# Patient Record
Sex: Female | Born: 1970
Health system: Southern US, Community
[De-identification: ages and names within clinical notes are randomized; demographics above are authoritative.]

## PROBLEM LIST (undated history)

## (undated) DIAGNOSIS — Z87891 Personal history of nicotine dependence: Secondary | ICD-10-CM

## (undated) DIAGNOSIS — T7840XA Allergy, unspecified, initial encounter: Secondary | ICD-10-CM

## (undated) DIAGNOSIS — F988 Other specified behavioral and emotional disorders with onset usually occurring in childhood and adolescence: Secondary | ICD-10-CM

## (undated) DIAGNOSIS — F4320 Adjustment disorder, unspecified: Secondary | ICD-10-CM

## (undated) DIAGNOSIS — E079 Disorder of thyroid, unspecified: Secondary | ICD-10-CM

## (undated) DIAGNOSIS — F32A Depression, unspecified: Secondary | ICD-10-CM

## (undated) DIAGNOSIS — N809 Endometriosis, unspecified: Secondary | ICD-10-CM

## (undated) HISTORY — DX: Disorder of thyroid, unspecified: E07.9

## (undated) HISTORY — PX: AUGMENTATION MAMMAPLASTY: SUR837

## (undated) HISTORY — DX: Other specified behavioral and emotional disorders with onset usually occurring in childhood and adolescence: F98.8

## (undated) HISTORY — DX: Endometriosis, unspecified: N80.9

## (undated) HISTORY — DX: Adjustment disorder, unspecified: F43.20

## (undated) HISTORY — DX: Allergy, unspecified, initial encounter: T78.40XA

## (undated) HISTORY — PX: WISDOM TOOTH EXTRACTION: SHX21

## (undated) HISTORY — PX: BREAST BIOPSY: SHX20

---

## 1999-04-11 ENCOUNTER — Other Ambulatory Visit: Admission: RE | Admit: 1999-04-11 | Discharge: 1999-04-11 | Payer: Self-pay | Admitting: Obstetrics and Gynecology

## 1999-05-08 ENCOUNTER — Emergency Department (HOSPITAL_COMMUNITY): Admission: EM | Admit: 1999-05-08 | Discharge: 1999-05-08 | Payer: Self-pay | Admitting: Emergency Medicine

## 2000-06-09 ENCOUNTER — Other Ambulatory Visit: Admission: RE | Admit: 2000-06-09 | Discharge: 2000-06-09 | Payer: Self-pay | Admitting: Obstetrics and Gynecology

## 2000-08-18 HISTORY — PX: OTHER SURGICAL HISTORY: SHX169

## 2000-11-17 ENCOUNTER — Encounter: Payer: Self-pay | Admitting: Family Medicine

## 2000-11-17 ENCOUNTER — Ambulatory Visit (HOSPITAL_COMMUNITY): Admission: RE | Admit: 2000-11-17 | Discharge: 2000-11-17 | Payer: Self-pay | Admitting: Family Medicine

## 2001-07-05 ENCOUNTER — Encounter: Admission: RE | Admit: 2001-07-05 | Discharge: 2001-10-03 | Payer: Self-pay | Admitting: Orthopedic Surgery

## 2001-10-25 ENCOUNTER — Other Ambulatory Visit: Admission: RE | Admit: 2001-10-25 | Discharge: 2001-10-25 | Payer: Self-pay | Admitting: Obstetrics and Gynecology

## 2002-01-24 ENCOUNTER — Emergency Department (HOSPITAL_COMMUNITY): Admission: EM | Admit: 2002-01-24 | Discharge: 2002-01-24 | Payer: Self-pay | Admitting: Emergency Medicine

## 2002-08-18 HISTORY — PX: BREAST SURGERY: SHX581

## 2002-10-05 ENCOUNTER — Ambulatory Visit (HOSPITAL_COMMUNITY): Admission: RE | Admit: 2002-10-05 | Discharge: 2002-10-05 | Payer: Self-pay | Admitting: Endocrinology

## 2002-10-05 ENCOUNTER — Encounter: Payer: Self-pay | Admitting: Endocrinology

## 2003-03-06 ENCOUNTER — Encounter: Payer: Self-pay | Admitting: Emergency Medicine

## 2003-03-06 ENCOUNTER — Emergency Department (HOSPITAL_COMMUNITY): Admission: EM | Admit: 2003-03-06 | Discharge: 2003-03-06 | Payer: Self-pay | Admitting: Emergency Medicine

## 2003-10-29 ENCOUNTER — Emergency Department (HOSPITAL_COMMUNITY): Admission: EM | Admit: 2003-10-29 | Discharge: 2003-10-29 | Payer: Self-pay | Admitting: Emergency Medicine

## 2004-07-01 ENCOUNTER — Ambulatory Visit: Payer: Self-pay | Admitting: Internal Medicine

## 2004-10-09 ENCOUNTER — Other Ambulatory Visit: Admission: RE | Admit: 2004-10-09 | Discharge: 2004-10-09 | Payer: Self-pay | Admitting: Obstetrics and Gynecology

## 2004-10-16 ENCOUNTER — Ambulatory Visit: Payer: Self-pay | Admitting: Internal Medicine

## 2004-11-22 ENCOUNTER — Ambulatory Visit: Payer: Self-pay | Admitting: Internal Medicine

## 2005-03-05 ENCOUNTER — Ambulatory Visit: Payer: Self-pay | Admitting: Internal Medicine

## 2005-05-07 ENCOUNTER — Ambulatory Visit: Payer: Self-pay | Admitting: Internal Medicine

## 2005-07-02 ENCOUNTER — Ambulatory Visit: Payer: Self-pay | Admitting: Internal Medicine

## 2005-09-04 ENCOUNTER — Encounter: Admission: RE | Admit: 2005-09-04 | Discharge: 2005-09-04 | Payer: Self-pay | Admitting: Neurology

## 2005-09-19 ENCOUNTER — Ambulatory Visit: Payer: Self-pay | Admitting: Internal Medicine

## 2005-10-27 ENCOUNTER — Ambulatory Visit: Payer: Self-pay | Admitting: Internal Medicine

## 2005-12-09 ENCOUNTER — Ambulatory Visit: Payer: Self-pay | Admitting: Internal Medicine

## 2006-01-09 ENCOUNTER — Ambulatory Visit: Payer: Self-pay | Admitting: Internal Medicine

## 2006-01-20 ENCOUNTER — Encounter (HOSPITAL_COMMUNITY): Admission: RE | Admit: 2006-01-20 | Discharge: 2006-04-20 | Payer: Self-pay | Admitting: Internal Medicine

## 2006-01-21 ENCOUNTER — Ambulatory Visit: Payer: Self-pay | Admitting: Internal Medicine

## 2006-02-05 ENCOUNTER — Ambulatory Visit: Payer: Self-pay | Admitting: Endocrinology

## 2006-03-21 ENCOUNTER — Ambulatory Visit: Payer: Self-pay | Admitting: Internal Medicine

## 2006-06-30 ENCOUNTER — Ambulatory Visit: Payer: Self-pay | Admitting: Internal Medicine

## 2006-06-30 LAB — CONVERTED CEMR LAB
ALT: 11 units/L (ref 0–40)
Basophils Relative: 1 % (ref 0.0–1.0)
CO2: 27 meq/L (ref 19–32)
Chloride: 108 meq/L (ref 96–112)
Chol/HDL Ratio, serum: 2.1
Eosinophil percent: 2.8 % (ref 0.0–5.0)
Glucose, Bld: 94 mg/dL (ref 70–99)
Hemoglobin, Urine: NEGATIVE
Hemoglobin: 12 g/dL (ref 12.0–15.0)
Ketones, ur: NEGATIVE mg/dL
LDL Cholesterol: 60 mg/dL (ref 0–99)
Lymphocytes Relative: 45 % (ref 12.0–46.0)
Neutro Abs: 1.3 10*3/uL — ABNORMAL LOW (ref 1.4–7.7)
Nitrite: NEGATIVE
Potassium: 4.2 meq/L (ref 3.5–5.1)
RBC: 3.88 M/uL (ref 3.87–5.11)
RDW: 11.6 % (ref 11.5–14.6)
Sodium: 140 meq/L (ref 135–145)
Specific Gravity, Urine: 1.025 (ref 1.000–1.03)
Total Protein, Urine: NEGATIVE mg/dL
Total Protein: 6.4 g/dL (ref 6.0–8.3)
pH: 5 (ref 5.0–8.0)

## 2006-07-02 ENCOUNTER — Ambulatory Visit: Payer: Self-pay | Admitting: Internal Medicine

## 2006-07-03 ENCOUNTER — Ambulatory Visit: Payer: Self-pay | Admitting: Internal Medicine

## 2006-08-10 ENCOUNTER — Ambulatory Visit: Payer: Self-pay | Admitting: Internal Medicine

## 2006-08-13 ENCOUNTER — Ambulatory Visit: Payer: Self-pay | Admitting: Internal Medicine

## 2006-08-14 ENCOUNTER — Encounter: Payer: Self-pay | Admitting: Internal Medicine

## 2006-08-14 LAB — CONVERTED CEMR LAB

## 2006-09-08 ENCOUNTER — Ambulatory Visit: Payer: Self-pay | Admitting: Internal Medicine

## 2006-09-15 ENCOUNTER — Ambulatory Visit: Payer: Self-pay | Admitting: Internal Medicine

## 2006-12-25 ENCOUNTER — Ambulatory Visit: Payer: Self-pay | Admitting: Internal Medicine

## 2006-12-25 LAB — CONVERTED CEMR LAB
Bilirubin Urine: NEGATIVE
Crystals: NEGATIVE
Ketones, ur: NEGATIVE mg/dL
Leukocytes, UA: NEGATIVE
Nitrite: NEGATIVE
Total Protein, Urine: NEGATIVE mg/dL
Urine Glucose: NEGATIVE mg/dL
Urobilinogen, UA: 0.2 (ref 0.0–1.0)
pH: 6 (ref 5.0–8.0)

## 2006-12-28 ENCOUNTER — Ambulatory Visit: Payer: Self-pay | Admitting: Internal Medicine

## 2006-12-28 LAB — CONVERTED CEMR LAB: Varicella IgG: 2.72 — ABNORMAL HIGH

## 2007-02-23 ENCOUNTER — Encounter: Payer: Self-pay | Admitting: Internal Medicine

## 2007-02-23 DIAGNOSIS — E0500 Thyrotoxicosis with diffuse goiter without thyrotoxic crisis or storm: Secondary | ICD-10-CM | POA: Insufficient documentation

## 2007-02-23 DIAGNOSIS — J309 Allergic rhinitis, unspecified: Secondary | ICD-10-CM | POA: Insufficient documentation

## 2007-02-23 DIAGNOSIS — J45909 Unspecified asthma, uncomplicated: Secondary | ICD-10-CM | POA: Insufficient documentation

## 2007-02-23 DIAGNOSIS — E0590 Thyrotoxicosis, unspecified without thyrotoxic crisis or storm: Secondary | ICD-10-CM | POA: Insufficient documentation

## 2007-07-12 ENCOUNTER — Telehealth: Payer: Self-pay | Admitting: Internal Medicine

## 2007-08-31 ENCOUNTER — Ambulatory Visit: Payer: Self-pay | Admitting: Internal Medicine

## 2007-08-31 DIAGNOSIS — F988 Other specified behavioral and emotional disorders with onset usually occurring in childhood and adolescence: Secondary | ICD-10-CM | POA: Insufficient documentation

## 2007-10-04 ENCOUNTER — Telehealth: Payer: Self-pay | Admitting: Internal Medicine

## 2007-10-26 ENCOUNTER — Ambulatory Visit: Payer: Self-pay | Admitting: Internal Medicine

## 2007-11-29 ENCOUNTER — Ambulatory Visit (HOSPITAL_COMMUNITY): Admission: RE | Admit: 2007-11-29 | Discharge: 2007-11-29 | Payer: Self-pay | Admitting: Orthopedic Surgery

## 2008-02-16 ENCOUNTER — Telehealth: Payer: Self-pay | Admitting: Internal Medicine

## 2009-02-27 ENCOUNTER — Telehealth (INDEPENDENT_AMBULATORY_CARE_PROVIDER_SITE_OTHER): Payer: Self-pay | Admitting: *Deleted

## 2010-06-27 ENCOUNTER — Ambulatory Visit: Payer: Self-pay | Admitting: Internal Medicine

## 2010-06-27 ENCOUNTER — Encounter: Payer: Self-pay | Admitting: Internal Medicine

## 2010-08-23 ENCOUNTER — Ambulatory Visit
Admission: RE | Admit: 2010-08-23 | Discharge: 2010-08-23 | Payer: Self-pay | Source: Home / Self Care | Attending: Internal Medicine | Admitting: Internal Medicine

## 2010-09-17 ENCOUNTER — Telehealth: Payer: Self-pay | Admitting: Internal Medicine

## 2010-09-17 NOTE — Assessment & Plan Note (Signed)
Summary: new to re est umr/mhf   Vital Signs:  Patient profile:   40 year old female Height:      66 inches Weight:      138.75 pounds BMI:     22.48 O2 Sat:      100 % on Room air Temp:     97.8 degrees F oral Pulse rate:   64 / minute Resp:     18 per minute BP sitting:   90 / 60  (left arm) Cuff size:   regular  Vitals Entered By: Glendell Docker CMA (June 27, 2010 9:36 AM)  O2 Flow:  Room air CC: Re-establish care Is Patient Diabetic? No Pain Assessment Patient in pain? no      Comments refill on albuterol inhaler, discuss medications   Primary Care Ketan Renz:  Dondra Spry DO  CC:  Re-establish care.  History of Present Illness: 40 y/o white female with hx of asthma and ADD to reestablish  adderall xr 20 mg ( only taking during work days) palpitations after adderall some SOB with palpitaitons  Hx of asthma - only using albuterol before exercise  hyperthyroidism -  TSH was normal followed by Dr. Horald Pollen    Preventive Screening-Counseling & Management  Alcohol-Tobacco     Alcohol drinks/day: 0     Smoking Status: quit  Caffeine-Diet-Exercise     Caffeine use/day: 3-4 beverages daily     Does Patient Exercise: yes     Times/week: 3  Allergies: 1)  ! Lexapro 2)  ! Singulair  Past History:  Past Medical History: Asthma Hyperthyroidism / Graves Dz Allergic rhinitis Adjustment d/o with depression ADD  Family History: Family history of stroke, hypetension, diabetes, thyoid disease, and migraine headaches.   Social History: Single  in new relationship daughter (102) is pregnant Former Smoker Alcohol use-no  working at [employer] use/day:  3-4 beverages daily Does Patient Exercise:  yes  Review of Systems  The patient denies fever, weight gain, chest pain, syncope, dyspnea on exertion, prolonged cough, abdominal pain, melena, hematochezia, severe indigestion/heartburn, and depression.    Physical Exam  General:  alert,  well-developed, and well-nourished.   Head:  normocephalic and atraumatic.   Eyes:  pupils equal, pupils round, and pupils reactive to light.   Ears:  R ear normal and L ear normal.   Mouth:  pharynx pink and moist.   Neck:  No deformities, masses, or tenderness noted. Lungs:  normal respiratory effort, normal breath sounds, and no wheezes.   Heart:  normal rate, regular rhythm, no murmur, and no gallop.   Abdomen:  soft, non-tender, normal bowel sounds, no distention, no hepatomegaly, and no splenomegaly.   Extremities:  No lower extremity edema  Neurologic:  cranial nerves II-XII intact and gait normal.   Psych:  normally interactive, good eye contact, not anxious appearing, and not depressed appearing.     Impression & Recommendations:  Problem # 1:  ADD (ICD-314.00) pt having palpitations with adderall. trial of wellbutrin  Problem # 2:  ASTHMA (ICD-493.90) Assessment: Unchanged  Her updated medication list for this problem includes:    Proair Hfa 108 (90 Base) Mcg/act Aers (Albuterol sulfate) .Marland Kitchen... 2 puffs q6 hrs as needed ( and before exercise)    Dulera 100-5 Mcg/act Aero (Mometasone furo-formoterol fum) .Marland Kitchen... 2 puffs two times a day  Complete Medication List: 1)  Allegra 180 Mg Tabs (Fexofenadine hcl) .... By mouth once daily 2)  Bupropion Hcl 150 Mg Xr12h-tab (Bupropion hcl) .... One by  mouth once daily x 2 weeks, then 2 tabs by mouth once daily 3)  Proair Hfa 108 (90 Base) Mcg/act Aers (Albuterol sulfate) .... 2 puffs q6 hrs as needed ( and before exercise) 4)  Flonase 50 Mcg/act Susp (Fluticasone propionate) .... 2 sprays each nostril once daily 5)  Dulera 100-5 Mcg/act Aero (Mometasone furo-formoterol fum) .... 2 puffs two times a day  Patient Instructions: 1)  Please schedule a follow-up appointment in 2 months. Prescriptions: PROAIR HFA 108 (90 BASE) MCG/ACT  AERS (ALBUTEROL SULFATE) 2 puffs q6 hrs as needed ( and before exercise)  #1 x 5   Entered and Authorized  by:   D. Thomos Lemons DO   Signed by:   D. Thomos Lemons DO on 06/27/2010   Method used:   Electronically to        [facility]* (retail)       [address]       [address] Shipping/mailing       [place], [place]  [address]       Ph: [phone]       Fax: [phone]   RxID:   [id number] BUPROPION HCL 150 MG XR12H-TAB (BUPROPION HCL) one by mouth once daily x 2 weeks, then 2 tabs by mouth once daily  #60 x 2   Entered and Authorized by:   D. Thomos Lemons DO   Signed by:   D. Thomos Lemons DO on 06/27/2010   Method used:   Electronically to        [facility]* (retail)       [address]       [address] [place] [address]       [place], [place]  [address]       Ph: [phone]       Fax: [phone]   RxID:   [id number] PROAIR HFA 108 (90 BASE) MCG/ACT  AERS (ALBUTEROL SULFATE) 2 puffs q6 hrs as needed ( and before exercise)  #1 x 5   Entered and Authorized by:   D. Thomos Lemons DO   Signed by:   D. Thomos Lemons DO on 06/27/2010   Method used:   Electronically to        [id number]* (retail)       [address]       [place], [place]  [address]       Ph: [phone] or [phone]       Fax: [phone]   RxID:   [id number] BUPROPION HCL 150 MG XR12H-TAB (BUPROPION HCL) one by mouth once daily x 2 weeks, then 2 tabs by mouth once daily  #60 x 1   Entered and Authorized by:   D. Thomos Lemons DO   Signed by:   D. Thomos Lemons DO on 06/27/2010   Method used:   Electronically to        [facility] [id number] (retail)       [address]       [place], [place]  [address]       Ph: [phone] or [phone]       Fax: [phone]   RxID:   [id number]    Orders Added: 1)  New Patient Level III [[address]]     Preventive Care Screening  PPD:    Date:  06/11/2010    Results:  negative  Last Flu Shot:    Date:  06/07/2010    Results:  given   Pap Smear:    Date:   03/25/2010    Results:  normal       Current Allergies (reviewed today): ! LEXAPRO ! SINGULAIR

## 2010-09-19 NOTE — Assessment & Plan Note (Signed)
Summary: 2 month follow up/mhf   Vital Signs:  Patient profile:   40 year old female Height:      66 inches Weight:      140.75 pounds BMI:     22.80 O2 Sat:      99 % on Room air Temp:     98.0 degrees F oral Pulse rate:   67 / minute Resp:     16 per minute BP sitting:   90 / 56  (right arm) Cuff size:   regular  Vitals Entered By: Glendell Docker CMA (August 23, 2010 9:24 AM)  O2 Flow:  Room air CC: 2 month follow up Is Patient Diabetic? No Pain Assessment Patient in pain? no      Comments wellbutrin is not working that well, stopped taking ,due to depressive symptoms, and wanting to cry all of the time   Primary Care Provider:  D. Thomos Lemons DO  CC:  2 month follow up.  History of Present Illness: tried wellbutrin did not help ADD symptoms took wellbutrin x 2 weeks also noticed change in mood  pt still having trouble focusing at work  Preventive Screening-Counseling & Management  Alcohol-Tobacco     Smoking Status: quit  Allergies: 1)  ! Lexapro 2)  ! Singulair  Past History:  Past Medical History: Asthma Hyperthyroidism / Graves Dz Allergic rhinitis Adjustment d/o with depression ADD    Family History: Family history of stroke, hypetension, diabetes, thyoid disease, and migraine headaches.    Social History: Single  in new relationship daughter (54) is pregnant Former Smoker Alcohol use-no  working at [employer]   Physical Exam  General:  alert, well-developed, and well-nourished.   Lungs:  normal respiratory effort, normal breath sounds, and no wheezes.   Heart:  normal rate, regular rhythm, no murmur, and no gallop.     Impression & Recommendations:  Problem # 1:  ADD (ICD-314.00) Assessment Deteriorated poor reponse to wellbutrin trial of vyvanse  Complete Medication List: 1)  Allegra 180 Mg Tabs (Fexofenadine hcl) .... By mouth once daily 2)  Proair Hfa 108 (90 Base) Mcg/act Aers (Albuterol sulfate) .... 2 puffs q6 hrs as needed  ( and before exercise) 3)  Dulera 100-5 Mcg/act Aero (Mometasone furo-formoterol fum) .... 2 puffs two times a day 4)  Vyvanse 30 Mg Caps (Lisdexamfetamine dimesylate) .... One by mouth once daily 5)  Vyvanse 30 Mg Caps (Lisdexamfetamine dimesylate) .... One by mouth once daily (fill on or after 09/23/2010)  Patient Instructions: 1)  Please schedule a follow-up appointment in 2 months. Prescriptions: VYVANSE 30 MG CAPS (LISDEXAMFETAMINE DIMESYLATE) one by mouth once daily (fill on or after 09/23/2010)  #30 x 0   Entered and Authorized by:   D. Thomos Lemons DO   Signed by:   D. Thomos Lemons DO on 08/23/2010   Method used:   Print then Give to Patient   RxID:   [id number] VYVANSE 30 MG CAPS (LISDEXAMFETAMINE DIMESYLATE) one by mouth once daily  #30 x 0   Entered and Authorized by:   D. Thomos Lemons DO   Signed by:   D. Thomos Lemons DO on 08/23/2010   Method used:   Print then Give to Patient   RxID:   [id number]    Orders Added: 1)  Est. Patient Level III [[address]]    Current Allergies (reviewed today): ! LEXAPRO ! SINGULAIR

## 2010-10-03 NOTE — Progress Notes (Addendum)
Summary: Medication Status Update  Phone Note Call from Patient Call back at [phone]   Caller: Patient Call For: D. Thomos Lemons DO Summary of Call: patient called and left voice message stating she was swtiched from Adderalll to  Vyvanse and was advised to call back if she did not think the medication was working properly. Her message states she would like to try an extended release or increase the dose. She also states she was seen by her gynecologist who advised her to check with Dr Artist Pais to see if he would consider adding Provigil. Initial call taken by: Glendell Docker CMA,  September 17, 2010 4:57 PM  Follow-up for Phone Call        called pt -  vyvanse effects are wearing off 5 hours into her shift I suggest we increase dose to 50 mg pt will come in to pick up rx and also drop off previous rx for 30 mg Follow-up by: D. Thomos Lemons DO,  September 17, 2010 5:28 PM    New/Updated Medications: VYVANSE 50 MG CAPS (LISDEXAMFETAMINE DIMESYLATE) one by mouth once daily Prescriptions: VYVANSE 50 MG CAPS (LISDEXAMFETAMINE DIMESYLATE) one by mouth once daily  #30 x 0   Entered and Authorized by:   D. Thomos Lemons DO   Signed by:   D. Thomos Lemons DO on 09/17/2010   Method used:   Print then Give to Patient   RxID:   [id number]   Appended Document: Medication Status Update patient was in office today and ha returned previous rx and picked up new rx

## 2010-10-16 ENCOUNTER — Ambulatory Visit (INDEPENDENT_AMBULATORY_CARE_PROVIDER_SITE_OTHER): Payer: 59 | Admitting: Internal Medicine

## 2010-10-16 ENCOUNTER — Encounter: Payer: Self-pay | Admitting: Internal Medicine

## 2010-10-16 DIAGNOSIS — J018 Other acute sinusitis: Secondary | ICD-10-CM | POA: Insufficient documentation

## 2010-10-16 DIAGNOSIS — F988 Other specified behavioral and emotional disorders with onset usually occurring in childhood and adolescence: Secondary | ICD-10-CM

## 2010-11-05 NOTE — Assessment & Plan Note (Signed)
Summary: COUGH CONGESTION/MHF   Vital Signs:  Patient profile:   40 year old female Height:      66 inches Weight:      141.50 pounds BMI:     22.92 O2 Sat:      98 % on Room air Temp:     97.8 degrees F oral Pulse rate:   58 / minute Resp:     18 per minute BP sitting:   90 / 60  (left arm)  Vitals Entered By: Glendell Docker CMA (October 16, 2010 10:35 AM)  O2 Flow:  Room air CC: Cough & Chest congestion Is Patient Diabetic? No Pain Assessment Patient in pain? no        Primary Care Provider:  Dondra Spry DO  CC:  Cough & Chest congestion.  History of Present Illness: 40 y/o female c/o head and chest congestion, sudafed and cough syrup taken with no relief  onset last week Friday (5 days), dry cough, fatigue, bodys ache, teeth pain, and right ear pain, nasal drainage green in color  ADD - doing much better on vyvance dose increased due to effects wearing off before end of her shift   Preventive Screening-Counseling & Management  Alcohol-Tobacco     Smoking Status: never  Allergies: 1)  ! Lexapro 2)  ! Singulair  Past History:  Past Medical History: Asthma Hyperthyroidism / Graves Dz  Allergic rhinitis Adjustment d/o with depression ADD    Family History: Family history of stroke, hypetension, diabetes, thyoid disease, and migraine headaches.     Social History: Single  in new relationship daughter ( Former Smoker Alcohol use-no  working at [employer] Smoking Status:  never  Physical Exam  General:  well-developed.   Head:  normocephalic and atraumatic.   Ears:  R ear normal and L ear normal.   Nose:  mucosal erythema and mucosal edema.   Mouth:  pharyngeal erythema.   Lungs:  normal respiratory effort, normal breath sounds, and no wheezes.   Heart:  normal rate, regular rhythm, no murmur, and no gallop.     Impression & Recommendations:  Problem # 1:  RHINOSINUSITIS, ACUTE (ICD-461.8)  Her updated medication list for this problem  includes:    Cefuroxime Axetil 500 Mg Tabs (Cefuroxime axetil) ..... One by mouth two times a day  Instructed on treatment. Call if symptoms persist or worsen.   Problem # 2:  ADD (ICD-314.00) Assessment: Improved no palpitations with vyvanse dose adjusted so pt minizes "wearing off" effect at end of her shift  Complete Medication List: 1)  Allegra 180 Mg Tabs (Fexofenadine hcl) .... By mouth once daily 2)  Proair Hfa 108 (90 Base) Mcg/act Aers (Albuterol sulfate) .... 2 puffs q6 hrs as needed ( and before exercise) 3)  Dulera 200-5 Mcg/act Aero (Mometasone furo-formoterol fum) .... 2 puffs two times a day 4)  Vyvanse 50 Mg Caps (Lisdexamfetamine dimesylate) .... One by mouth once daily 5)  Cefuroxime Axetil 500 Mg Tabs (Cefuroxime axetil) .... One by mouth two times a day 6)  Vyvanse 50 Mg Caps (Lisdexamfetamine dimesylate) .... One by mouth once daily (fill on or after 11/14/2010) 7)  Vyvanse 50 Mg Caps (Lisdexamfetamine dimesylate) .... One by mouth once daily (fill on or after 12/15/2010) 8)  Fluconazole 150 Mg Tabs (Fluconazole) .... One by mouth once daily as directed  Patient Instructions: 1)  Call our office if your symptoms do not  improve or gets worse. 2)  Please schedule a follow-up appointment in  3 months Prescriptions: FLUCONAZOLE 150 MG TABS (FLUCONAZOLE) one by mouth once daily as directed  #3 x 0   Entered and Authorized by:   D. Thomos Lemons DO   Signed by:   D. Thomos Lemons DO on 10/16/2010   Method used:   Print then Give to Patient   RxID:   [id number] VYVANSE 50 MG CAPS (LISDEXAMFETAMINE DIMESYLATE) one by mouth once daily (fill on or after 12/15/2010)  #30 x 0   Entered and Authorized by:   D. Thomos Lemons DO   Signed by:   D. Thomos Lemons DO on 10/16/2010   Method used:   Print then Give to Patient   RxID:   [id number] VYVANSE 50 MG CAPS (LISDEXAMFETAMINE DIMESYLATE) one by mouth once daily (fill on or after 11/14/2010)  #30 x 0   Entered and Authorized by:    D. Thomos Lemons DO   Signed by:   D. Thomos Lemons DO on 10/16/2010   Method used:   Print then Give to Patient   RxID:   [id number] DULERA 200-5 MCG/ACT AERO (MOMETASONE FURO-FORMOTEROL FUM) 2 puffs two times a day  #1 x 5   Entered and Authorized by:   D. Thomos Lemons DO   Signed by:   D. Thomos Lemons DO on 10/16/2010   Method used:   Electronically to        [facility]* (retail)       [address]       [address] [place] [address]       [place], [place]  [address]       Ph: [phone]       Fax: [phone]   RxID:   [id number] PROAIR HFA 108 (90 BASE) MCG/ACT  AERS (ALBUTEROL SULFATE) 2 puffs q6 hrs as needed ( and before exercise)  #1 x 5   Entered and Authorized by:   D. Thomos Lemons DO   Signed by:   D. Thomos Lemons DO on 10/16/2010   Method used:   Electronically to        [facility]* (retail)       [address]       [address]       [place], [place]  [address]       Ph: [phone]       Fax: [phone]   RxID:   [id number] VYVANSE 50 MG CAPS (LISDEXAMFETAMINE DIMESYLATE) one by mouth once daily  #30 x 0   Entered and Authorized by:   D. Thomos Lemons DO   Signed by:   D. Thomos Lemons DO on 10/16/2010   Method used:   Print then Give to Patient   RxID:   [id number] CEFUROXIME AXETIL 500 MG TABS (CEFUROXIME AXETIL) one by mouth two times a day  #20 x 0   Entered and Authorized by:   D. Thomos Lemons DO   Signed by:   D. Thomos Lemons DO on 10/16/2010   Method used:   Electronically to        [facility]* (retail)       [address]       [address]/mail[address]       [place], [place]  [address]       Ph: [phone]       Fax: [phone]   RxID:   [id number]    Orders Added: 1)  Est. Patient Level III [  [address]]    Current Allergies (reviewed today): ! LEXAPRO ! SINGULAIR

## 2011-02-28 ENCOUNTER — Encounter: Payer: Self-pay | Admitting: Family

## 2011-03-03 ENCOUNTER — Ambulatory Visit: Payer: 59 | Admitting: Family

## 2011-03-07 ENCOUNTER — Encounter: Payer: Self-pay | Admitting: Family

## 2011-03-07 ENCOUNTER — Ambulatory Visit (INDEPENDENT_AMBULATORY_CARE_PROVIDER_SITE_OTHER): Payer: 59 | Admitting: Family

## 2011-03-07 VITALS — BP 98/70 | HR 72 | Temp 98.7°F | Resp 16 | Ht 65.98 in | Wt 147.0 lb

## 2011-03-07 DIAGNOSIS — F988 Other specified behavioral and emotional disorders with onset usually occurring in childhood and adolescence: Secondary | ICD-10-CM

## 2011-03-07 DIAGNOSIS — F418 Other specified anxiety disorders: Secondary | ICD-10-CM | POA: Insufficient documentation

## 2011-03-07 DIAGNOSIS — E0500 Thyrotoxicosis with diffuse goiter without thyrotoxic crisis or storm: Secondary | ICD-10-CM

## 2011-03-07 DIAGNOSIS — F341 Dysthymic disorder: Secondary | ICD-10-CM

## 2011-03-07 LAB — T3, FREE: T3, Free: 2.5 pg/mL (ref 2.3–4.2)

## 2011-03-07 LAB — T4, FREE: Free T4: 1.1 ng/dL (ref 0.80–1.80)

## 2011-03-07 MED ORDER — CITALOPRAM HYDROBROMIDE 20 MG PO TABS: 20.0000 mg | ORAL_TABLET | Freq: Every day | ORAL | Status: DC

## 2011-03-07 MED ORDER — LISDEXAMFETAMINE DIMESYLATE 50 MG PO CAPS: 50.0000 mg | ORAL_CAPSULE | Freq: Every day | ORAL | Status: DC

## 2011-03-07 NOTE — Assessment & Plan Note (Addendum)
The patient wishes to try treatment for her depression and anxiety. We will give her a trial of citalopram. Side effects were discussed with the patient as well as risk of suicide ideation. She is instructed to go to emergency department if she develops suicidal ideation on this medication. Patient will also be referred to psychology for counseling.25 minutes percent with the patient today, greater than 50% of this time was spent counseling the patient on her anxiety and depression.
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
## 2011-03-07 NOTE — Assessment & Plan Note (Signed)
Check TFT's if abnormal will refer to Dr. Talmage Nap.

## 2011-03-07 NOTE — Patient Instructions (Signed)
Citalopram 20mg - 1/2 tablet once daily for 1 week, then increase to a full tablet once daily on week two.  Follow up in 1 month.

## 2011-03-07 NOTE — Progress Notes (Signed)
Addended by: Sandford Craze on: 03/07/2011 12:04 PM   Modules accepted: Orders

## 2011-03-07 NOTE — Assessment & Plan Note (Signed)
Stable, refill Vyvanse.

## 2011-03-10 ENCOUNTER — Encounter: Payer: Self-pay | Admitting: Family

## 2011-03-11 ENCOUNTER — Telehealth: Payer: Self-pay | Admitting: Internal Medicine

## 2011-03-11 NOTE — Telephone Encounter (Signed)
Requesting thyroid results

## 2011-03-11 NOTE — Telephone Encounter (Signed)
Thyroid tests are all normal.  She should be receiving a letter in the mail as well.

## 2011-03-12 NOTE — Telephone Encounter (Signed)
Call placed to patient at [phone], she was informed per Sandford Craze instructions

## 2011-03-20 ENCOUNTER — Ambulatory Visit (INDEPENDENT_AMBULATORY_CARE_PROVIDER_SITE_OTHER): Payer: 59 | Admitting: Professional

## 2011-03-20 DIAGNOSIS — F411 Generalized anxiety disorder: Secondary | ICD-10-CM

## 2011-04-04 ENCOUNTER — Encounter: Payer: Self-pay | Admitting: Family

## 2011-04-04 ENCOUNTER — Ambulatory Visit (INDEPENDENT_AMBULATORY_CARE_PROVIDER_SITE_OTHER): Payer: 59 | Admitting: Family

## 2011-04-04 DIAGNOSIS — F418 Other specified anxiety disorders: Secondary | ICD-10-CM

## 2011-04-04 DIAGNOSIS — F341 Dysthymic disorder: Secondary | ICD-10-CM

## 2011-04-04 DIAGNOSIS — E0590 Thyrotoxicosis, unspecified without thyrotoxic crisis or storm: Secondary | ICD-10-CM

## 2011-04-04 MED ORDER — LISDEXAMFETAMINE DIMESYLATE 50 MG PO CAPS: 50.0000 mg | ORAL_CAPSULE | Freq: Every day | ORAL | Status: DC

## 2011-04-04 NOTE — Progress Notes (Signed)
  Subjective:    Patient ID: [name], female    DOB: 1970-11-27, 40 y.o.   MRN: [id number]  HPI  [name] is a 40 yr old female who presents today for follow up of her depression and anxiety.  1 month ago she was started on citalopram.  She reports that she did see the therapist.  Luiz Blare).  He recommended that she has been walking more, talking more to family members.  She is moving back into her house in October which she is looking forward to.  She developed tremors on the citalopram. She took it for 2 weeks and felt that this improved her mood.    Review of Systems See HPI  Past Medical History  Diagnosis Date  . Asthma   . Allergy     allergic rhinitis  . ADD (attention deficit disorder)   . Adjustment disorder     without depression  . Thyroid disease     hyperthyroidism / Graves Disease    History   Social History  . Marital Status: Single    Spouse Name: N/A    Number of Children: 1  . Years of Education: N/A   Occupational History  . RN    Social History Main Topics  . Smoking status: Former Games developer  . Smokeless tobacco: Never Used  . Alcohol Use: No  . Drug Use: Not on file  . Sexually Active: Not on file   Other Topics Concern  . Not on file   Social History Narrative  . No narrative on file    No past surgical history on file.  Family History  Problem Relation Age of Onset  . Diabetes Other   . Stroke Other   . Hypertension Other   . Thyroid disease Other   . Migraines Other     Allergies  Allergen Reactions  . Escitalopram Oxalate   . Montelukast Sodium     Current Outpatient Prescriptions on File Prior to Visit  Medication Sig Dispense Refill  . albuterol (PROAIR HFA) 108 (90 BASE) MCG/ACT inhaler Inhale 2 puffs into the lungs every 6 (six) hours. As needed and before exercise.       . fexofenadine (ALLEGRA) 180 MG tablet Take 180 mg by mouth daily.        . Mometasone Furo-Formoterol Fum (DULERA) 200-5 MCG/ACT AERO Inhale 1  puff into the lungs 2 (two) times daily.        . varenicline (CHANTIX) 1 MG tablet Take 1 mg by mouth 2 (two) times daily.          BP 100/66  Temp(Src) 97.6 F (36.4 C) (Oral)  Ht 5\' 6"  (1.676 m)  Wt 145 lb (65.772 kg)  BMI 23.40 kg/m2  LMP 04/04/2011       Objective:   Physical Exam  Constitutional: She appears well-developed and well-nourished. No distress.  Psychiatric: She has a normal mood and affect. Her behavior is normal. Judgment and thought content normal.          Assessment & Plan:

## 2011-04-04 NOTE — Assessment & Plan Note (Signed)
Pt stopped citalopram due to tremor.  Since that time she notes that her mood and anxiety level have remained stable.  At this point she wishes to remain off of medications and continue therapy.  She has tolerated Wellbutrin 150 in the past, this may be an option for her at some point if needed.  Plan f/u in 3 months.  15 minutes spent with pt today. >50% of this time was spent counseling pt on her anxiety and depression.

## 2011-04-04 NOTE — Assessment & Plan Note (Signed)
Pt's follow up TSH was normal.  Monitor.

## 2011-04-04 NOTE — Patient Instructions (Signed)
Please continue to see your therapist. Follow up in 3 months, sooner if problems or concerns.

## 2011-05-21 ENCOUNTER — Other Ambulatory Visit: Payer: Self-pay | Admitting: *Deleted

## 2011-05-21 MED ORDER — LISDEXAMFETAMINE DIMESYLATE 50 MG PO CAPS: 50.0000 mg | ORAL_CAPSULE | Freq: Every day | ORAL | Status: DC

## 2011-05-21 NOTE — Telephone Encounter (Signed)
Left message on cell that rx is at front desk for pick up and to call if any questions.

## 2011-05-21 NOTE — Telephone Encounter (Signed)
Pt called stating she needs refill on Vyvanse. Rx printed and forwarded to provider for signature. Pt requests message be left on cell when Rx is ready for pick up.

## 2011-05-21 NOTE — Telephone Encounter (Signed)
Rx signed.

## 2011-05-26 ENCOUNTER — Ambulatory Visit (INDEPENDENT_AMBULATORY_CARE_PROVIDER_SITE_OTHER): Payer: 59 | Admitting: Family

## 2011-05-26 ENCOUNTER — Encounter: Payer: Self-pay | Admitting: Family

## 2011-05-26 VITALS — BP 104/74 | HR 72 | Temp 97.9°F | Resp 16 | Wt 150.1 lb

## 2011-05-26 DIAGNOSIS — F17200 Nicotine dependence, unspecified, uncomplicated: Secondary | ICD-10-CM

## 2011-05-26 DIAGNOSIS — R300 Dysuria: Secondary | ICD-10-CM

## 2011-05-26 DIAGNOSIS — N390 Urinary tract infection, site not specified: Secondary | ICD-10-CM

## 2011-05-26 DIAGNOSIS — Z720 Tobacco use: Secondary | ICD-10-CM

## 2011-05-26 LAB — POCT URINALYSIS DIPSTICK

## 2011-05-26 MED ORDER — FLUCONAZOLE 150 MG PO TABS: 150.0000 mg | ORAL_TABLET | Freq: Once | ORAL | Status: AC

## 2011-05-26 MED ORDER — CIPROFLOXACIN HCL 500 MG PO TABS: 500.0000 mg | ORAL_TABLET | Freq: Two times a day (BID) | ORAL | Status: AC

## 2011-05-26 NOTE — Assessment & Plan Note (Signed)
Urine dip could not be read due to Azo discoloration.  Will send for culture and plan to treat with cipro.  Pt will contact us if symptoms worsen or if they do not improve.

## 2011-05-26 NOTE — Progress Notes (Signed)
Subjective:    Patient ID: [name], female    DOB: 1970-09-06, 40 y.o.   MRN: [id number]  HPI  [name] presents today with complaint of dysuria.  1) Dysuria- Symptoms started yesterday afternoon.  She reports that she had an episode of diarrhea Sunday morning.  She attributed this to eating some barbecue that had been "left out."  Diarrhea resolved after a few hours. She has been drinking cranberry juice, h20, and Azo without improvement.  She has + frequency.  Denies fever, low back pain or gross hematuria.    2)Tobacco abuse- tried chantix for 3 weeks.  She then started smoking again after she discontinued the chantix.  Wants to quit before she moves into her new house.  Review of Systems See HPI  Past Medical History  Diagnosis Date  . Asthma   . Allergy     allergic rhinitis  . ADD (attention deficit disorder)   . Adjustment disorder     without depression  . Thyroid disease     hyperthyroidism / Graves Disease    History   Social History  . Marital Status: Single    Spouse Name: N/A    Number of Children: 1  . Years of Education: N/A   Occupational History  . RN    Social History Main Topics  . Smoking status: Former Games developer  . Smokeless tobacco: Never Used  . Alcohol Use: No  . Drug Use: Not on file  . Sexually Active: Not on file   Other Topics Concern  . Not on file   Social History Narrative  . No narrative on file    No past surgical history on file.  Family History  Problem Relation Age of Onset  . Diabetes Other   . Stroke Other   . Hypertension Other   . Thyroid disease Other   . Migraines Other     Allergies  Allergen Reactions  . Escitalopram Oxalate   . Montelukast Sodium     Current Outpatient Prescriptions on File Prior to Visit  Medication Sig Dispense Refill  . albuterol (PROAIR HFA) 108 (90 BASE) MCG/ACT inhaler Inhale 2 puffs into the lungs every 6 (six) hours. As needed and before exercise.       . fexofenadine  (ALLEGRA) 180 MG tablet Take 180 mg by mouth daily.        Marland Kitchen lisdexamfetamine (VYVANSE) 50 MG capsule Take 1 capsule (50 mg total) by mouth daily.  30 capsule  0  . Mometasone Furo-Formoterol Fum (DULERA) 200-5 MCG/ACT AERO Inhale 1 puff into the lungs 2 (two) times daily.        . varenicline (CHANTIX) 1 MG tablet Take 1 mg by mouth 2 (two) times daily.          BP 104/74  Pulse 72  Temp(Src) 97.9 F (36.6 C) (Oral)  Resp 16  Wt 150 lb 1.3 oz (68.076 kg)  LMP 05/18/2011       Objective:   Physical Exam  Constitutional: She appears well-developed and well-nourished.  HENT:  Head: Normocephalic and atraumatic.  Cardiovascular: Normal rate and regular rhythm.   No murmur heard. Pulmonary/Chest: Effort normal and breath sounds normal. No respiratory distress. She has no wheezes. She has no rales. She exhibits no tenderness.  Abdominal: Soft. Bowel sounds are normal. She exhibits no distension and no mass. There is no tenderness. There is no rebound and no guarding.  Genitourinary:       Negative CVAT  bilaterally.   Psychiatric: She has a normal mood and affect. Her behavior is normal. Judgment and thought content normal.          Assessment & Plan:

## 2011-05-26 NOTE — Patient Instructions (Addendum)
Call if you develop low back, blood in urine, fever 101, or if you are not feeling better in 2-3 days. Good luck quitting smoking!

## 2011-05-26 NOTE — Assessment & Plan Note (Signed)
Pt was counseled on importance of tobacco cessation.  She wishes to restart the chantix.  I have provided her with a hand written rx for chantix starter month pack to be followed by the continuation pack with 1 refill.

## 2011-05-29 LAB — URINE CULTURE: Colony Count: 80000

## 2011-05-30 ENCOUNTER — Encounter: Payer: Self-pay | Admitting: Family

## 2011-07-02 ENCOUNTER — Ambulatory Visit (INDEPENDENT_AMBULATORY_CARE_PROVIDER_SITE_OTHER): Payer: 59 | Admitting: Family

## 2011-07-02 ENCOUNTER — Encounter: Payer: Self-pay | Admitting: Family

## 2011-07-02 ENCOUNTER — Ambulatory Visit: Payer: 59 | Admitting: Internal Medicine

## 2011-07-02 VITALS — BP 90/70 | HR 72 | Temp 97.8°F | Resp 16 | Ht 66.0 in | Wt 154.0 lb

## 2011-07-02 DIAGNOSIS — E0590 Thyrotoxicosis, unspecified without thyrotoxic crisis or storm: Secondary | ICD-10-CM

## 2011-07-02 DIAGNOSIS — F341 Dysthymic disorder: Secondary | ICD-10-CM

## 2011-07-02 DIAGNOSIS — Z720 Tobacco use: Secondary | ICD-10-CM

## 2011-07-02 DIAGNOSIS — F988 Other specified behavioral and emotional disorders with onset usually occurring in childhood and adolescence: Secondary | ICD-10-CM

## 2011-07-02 DIAGNOSIS — F17200 Nicotine dependence, unspecified, uncomplicated: Secondary | ICD-10-CM

## 2011-07-02 DIAGNOSIS — F418 Other specified anxiety disorders: Secondary | ICD-10-CM

## 2011-07-02 MED ORDER — AMPHETAMINE-DEXTROAMPHET ER 20 MG PO CP24: 20.0000 mg | ORAL_CAPSULE | ORAL | Status: DC

## 2011-07-02 MED ORDER — VARENICLINE TARTRATE 1 MG PO TABS: 1.0000 mg | ORAL_TABLET | Freq: Two times a day (BID) | ORAL | Status: DC

## 2011-07-02 NOTE — Assessment & Plan Note (Signed)
Pt has quit smoking.  She continue with chantix.

## 2011-07-02 NOTE — Patient Instructions (Signed)
Follow up in 2 months. Sooner if problems or concerns.

## 2011-07-02 NOTE — Assessment & Plan Note (Signed)
Deteriorated.  Will change vyvanse back to Adderall which she has done will on in the past.

## 2011-07-02 NOTE — Assessment & Plan Note (Signed)
She has had some weight gain which is likely due to quitting smoking.  But given hx of hyperthyroid, will check TSH today.
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
## 2011-08-15 ENCOUNTER — Telehealth: Payer: Self-pay | Admitting: *Deleted

## 2011-08-15 MED ORDER — AMPHETAMINE-DEXTROAMPHET ER 20 MG PO CP24: 20.0000 mg | ORAL_CAPSULE | ORAL | Status: DC

## 2011-08-15 NOTE — Telephone Encounter (Signed)
Pt called requesting to pick up a refill of Adderall. States she has enough to last until Monday. Advised pt that Efraim Kaufmann is out of the office today but will return on Monday and we'll call her when Rx is ready for pick up. Rx printed and forwarded to Provider for signature. Pt was transferred to front desk to schedule f/u in January.

## 2011-08-18 ENCOUNTER — Ambulatory Visit (INDEPENDENT_AMBULATORY_CARE_PROVIDER_SITE_OTHER): Payer: 59 | Admitting: Family

## 2011-08-18 ENCOUNTER — Encounter: Payer: Self-pay | Admitting: Family

## 2011-08-18 VITALS — BP 108/72 | HR 95 | Temp 97.9°F | Resp 16 | Ht 66.0 in | Wt 156.0 lb

## 2011-08-18 DIAGNOSIS — F418 Other specified anxiety disorders: Secondary | ICD-10-CM

## 2011-08-18 DIAGNOSIS — E0500 Thyrotoxicosis with diffuse goiter without thyrotoxic crisis or storm: Secondary | ICD-10-CM

## 2011-08-18 DIAGNOSIS — F17200 Nicotine dependence, unspecified, uncomplicated: Secondary | ICD-10-CM

## 2011-08-18 DIAGNOSIS — E0590 Thyrotoxicosis, unspecified without thyrotoxic crisis or storm: Secondary | ICD-10-CM

## 2011-08-18 DIAGNOSIS — F341 Dysthymic disorder: Secondary | ICD-10-CM

## 2011-08-18 DIAGNOSIS — Z720 Tobacco use: Secondary | ICD-10-CM

## 2011-08-18 DIAGNOSIS — F988 Other specified behavioral and emotional disorders with onset usually occurring in childhood and adolescence: Secondary | ICD-10-CM

## 2011-08-18 LAB — TSH: TSH: 1.774 u[IU]/mL (ref 0.350–4.500)

## 2011-08-18 MED ORDER — VARENICLINE TARTRATE 1 MG PO TABS: 1.0000 mg | ORAL_TABLET | Freq: Two times a day (BID) | ORAL | Status: DC

## 2011-08-18 MED ORDER — MOMETASONE FURO-FORMOTEROL FUM 200-5 MCG/ACT IN AERO: 1.0000 | INHALATION_SPRAY | Freq: Two times a day (BID) | RESPIRATORY_TRACT | Status: DC

## 2011-08-18 MED ORDER — ALBUTEROL SULFATE HFA 108 (90 BASE) MCG/ACT IN AERS: 2.0000 | INHALATION_SPRAY | Freq: Four times a day (QID) | RESPIRATORY_TRACT | Status: DC

## 2011-08-18 NOTE — Assessment & Plan Note (Signed)
Stable on current dose of Adderall. Continue same.  

## 2011-08-18 NOTE — Assessment & Plan Note (Signed)
Clinically stable.  She is happy to be in her new home. She is currently off of antidepressants.

## 2011-08-18 NOTE — Patient Instructions (Signed)
Please follow up in 3 months.  Complete lab work prior to leaving.  Happy New Year!

## 2011-08-18 NOTE — Assessment & Plan Note (Addendum)
She has had mild weight gain. Obtain TSH.

## 2011-08-18 NOTE — Progress Notes (Signed)
  Subjective:    Patient ID: [name], female    DOB: [birth year], 40 y.o.   MRN: [id number]  HPI  [name] is a 40 yr old female who presents today for follow up.  1) Depression/anxiety-moved into her new house.   2) ADD- reports that she is able to stay focused at work.   3) Tobacco abuse- She reports that she continues the chantix once daily.  She has not smoked in 2 months.    4) Weight gain- she has gained 2 pounds since last visit. She "forgot" to complete her TSH last visit as ordered.    Review of Systems See HPI  Past Medical History  Diagnosis Date  . Asthma   . Allergy     allergic rhinitis  . ADD (attention deficit disorder)   . Adjustment disorder     without depression  . Thyroid disease     hyperthyroidism / Graves Disease    History   Social History  . Marital Status: Single    Spouse Name: N/A    Number of Children: 1  . Years of Education: N/A   Occupational History  . RN    Social History Main Topics  . Smoking status: Former Games developer  . Smokeless tobacco: Never Used  . Alcohol Use: No  . Drug Use: Not on file  . Sexually Active: Not on file   Other Topics Concern  . Not on file   Social History Narrative  . No narrative on file    No past surgical history on file.  Family History  Problem Relation Age of Onset  . Diabetes Other   . Stroke Other   . Hypertension Other   . Thyroid disease Other   . Migraines Other     Allergies  Allergen Reactions  . Escitalopram Oxalate   . Montelukast Sodium     Current Outpatient Prescriptions on File Prior to Visit  Medication Sig Dispense Refill  . amphetamine-dextroamphetamine (ADDERALL XR) 20 MG 24 hr capsule Take 1 capsule (20 mg total) by mouth every morning.  30 capsule  0  . fexofenadine (ALLEGRA) 180 MG tablet Take 180 mg by mouth daily as needed.       . Norethindrone-Ethinyl Estradiol-Fe Biphas (LO LOESTRIN FE) 1 MG-10 MCG / 10 MCG tablet Take 1 tablet by mouth daily.           BP 108/72  Pulse 95  Temp(Src) 97.9 F (36.6 C) (Oral)  Resp 16  Ht 5\' 6"  (1.676 m)  Wt 156 lb 0.6 oz (70.779 kg)  BMI 25.19 kg/m2  SpO2 100%  LMP 08/04/2011       Objective:   Physical Exam  Constitutional: She is oriented to person, place, and time. She appears well-developed and well-nourished. No distress.  HENT:  Head: Normocephalic and atraumatic.  Mouth/Throat: No oropharyngeal exudate.  Eyes: Conjunctivae are normal. Pupils are equal, round, and reactive to light.  Neck: Normal range of motion. Neck supple.  Cardiovascular: Normal rate and regular rhythm.   No murmur heard. Pulmonary/Chest: Effort normal and breath sounds normal. No respiratory distress. She has no wheezes. She has no rales.  Neurological: She is alert and oriented to person, place, and time.  Skin: Skin is warm and dry.  Psychiatric: She has a normal mood and affect. Her behavior is normal. Judgment and thought content normal.          Assessment & Plan:

## 2011-08-18 NOTE — Assessment & Plan Note (Signed)
Improved. She is instructed to completed the full 12 weeks of chantix.

## 2011-08-19 ENCOUNTER — Encounter: Payer: Self-pay | Admitting: Family

## 2011-08-29 ENCOUNTER — Telehealth: Payer: Self-pay | Admitting: *Deleted

## 2011-08-29 NOTE — Telephone Encounter (Signed)
Received fax from [facility] requesting prior auth for pt's generic Adderall XR. Called [phone] and requested form from [employer].

## 2011-08-29 NOTE — Telephone Encounter (Signed)
Notified pt of process and that I will notify her once we receive determination from ins. Company.

## 2011-09-01 NOTE — Telephone Encounter (Signed)
Form received, completed and forwarded to Provider for signature. 

## 2011-09-01 NOTE — Telephone Encounter (Signed)
Completed form faxed to [employer] [phone]. Awaiting approval/denial status.

## 2011-09-02 NOTE — Telephone Encounter (Signed)
Approval received from [employer] from 09/01/11 through 08/17/2038. Notified pharmacy and left detailed message on pt's voicemail.

## 2011-10-07 ENCOUNTER — Other Ambulatory Visit: Payer: Self-pay | Admitting: *Deleted

## 2011-10-07 MED ORDER — AMPHETAMINE-DEXTROAMPHET ER 20 MG PO CP24: 20.0000 mg | ORAL_CAPSULE | ORAL | Status: DC

## 2011-10-07 NOTE — Telephone Encounter (Signed)
Received message from pt requesting to pick up Adderall XR prescription. Pt has f/u on 11/10/11. Med was last prescribed 08/29/11. Rx printed and forwarded to Provider for signature. Pt is working from 7am to 7pm today and states it is ok to leave message on voicemail.

## 2011-10-07 NOTE — Telephone Encounter (Signed)
Rx has been signed and left at front desk for pick up. Left detailed message on pt's voicemail re: Rx completion and to call if any question.

## 2011-11-10 ENCOUNTER — Ambulatory Visit (INDEPENDENT_AMBULATORY_CARE_PROVIDER_SITE_OTHER): Payer: 59 | Admitting: Family

## 2011-11-10 ENCOUNTER — Encounter: Payer: Self-pay | Admitting: Family

## 2011-11-10 VITALS — BP 102/66 | HR 74 | Temp 97.8°F | Resp 16 | Ht 66.0 in | Wt 161.0 lb

## 2011-11-10 DIAGNOSIS — F17200 Nicotine dependence, unspecified, uncomplicated: Secondary | ICD-10-CM

## 2011-11-10 DIAGNOSIS — J309 Allergic rhinitis, unspecified: Secondary | ICD-10-CM

## 2011-11-10 DIAGNOSIS — F418 Other specified anxiety disorders: Secondary | ICD-10-CM

## 2011-11-10 DIAGNOSIS — F988 Other specified behavioral and emotional disorders with onset usually occurring in childhood and adolescence: Secondary | ICD-10-CM

## 2011-11-10 DIAGNOSIS — F341 Dysthymic disorder: Secondary | ICD-10-CM

## 2011-11-10 DIAGNOSIS — Z720 Tobacco use: Secondary | ICD-10-CM

## 2011-11-10 DIAGNOSIS — N760 Acute vaginitis: Secondary | ICD-10-CM

## 2011-11-10 DIAGNOSIS — A499 Bacterial infection, unspecified: Secondary | ICD-10-CM

## 2011-11-10 DIAGNOSIS — B9689 Other specified bacterial agents as the cause of diseases classified elsewhere: Secondary | ICD-10-CM

## 2011-11-10 MED ORDER — AMPHETAMINE-DEXTROAMPHET ER 20 MG PO CP24: 20.0000 mg | ORAL_CAPSULE | ORAL | Status: DC

## 2011-11-10 MED ORDER — FLUTICASONE PROPIONATE 50 MCG/ACT NA SUSP: 2.0000 | Freq: Every day | NASAL | Status: DC

## 2011-11-10 MED ORDER — METRONIDAZOLE 500 MG PO TABS: 500.0000 mg | ORAL_TABLET | Freq: Three times a day (TID) | ORAL | Status: AC

## 2011-11-10 MED ORDER — MOMETASONE FURO-FORMOTEROL FUM 200-5 MCG/ACT IN AERO: 1.0000 | INHALATION_SPRAY | Freq: Two times a day (BID) | RESPIRATORY_TRACT | Status: DC

## 2011-11-10 NOTE — Progress Notes (Signed)
  Subjective:    Patient ID: [name], female    DOB: 02/07/1971, 41 y.o.   MRN: [id number]  HPI  [name] is a 41 yr old female who presents today for follow up.  1) Depression- She is currently off of antidepressants.  Tells me that she has good days and bad days, but overall she is feeling very good. She is happy to be in her new home.  2) ADD-Reports that this is well controlled on Adderall.   3)?Bacterial Vaginosis- Reports some vaginal irritation, vaginal odor.  Hx of recurrent BV. She is requesting rx for metronidazole tabs.    4) Tobacco abuse- She has now been quit for >4 months.     Review of Systems See  Past Medical History  Diagnosis Date  . Asthma   . Allergy     allergic rhinitis  . ADD (attention deficit disorder)   . Adjustment disorder     without depression  . Thyroid disease     hyperthyroidism / Graves Disease    History   Social History  . Marital Status: Single    Spouse Name: N/A    Number of Children: 1  . Years of Education: N/A   Occupational History  . RN    Social History Main Topics  . Smoking status: Former Smoker    Types: Cigarettes    Quit date: 07/19/2011  . Smokeless tobacco: Never Used  . Alcohol Use: No  . Drug Use: Not on file  . Sexually Active: Not on file   Other Topics Concern  . Not on file   Social History Narrative  . No narrative on file    No past surgical history on file.  Family History  Problem Relation Age of Onset  . Diabetes Other   . Stroke Other   . Hypertension Other   . Thyroid disease Other   . Migraines Other     Allergies  Allergen Reactions  . Escitalopram Oxalate   . Montelukast Sodium     Current Outpatient Prescriptions on File Prior to Visit  Medication Sig Dispense Refill  . albuterol (PROAIR HFA) 108 (90 BASE) MCG/ACT inhaler Inhale 2 puffs into the lungs every 6 (six) hours. As needed and before exercise.  3.7 g  2  . fexofenadine (ALLEGRA) 180 MG tablet Take  180 mg by mouth daily as needed.         BP 102/66  Pulse 74  Temp(Src) 97.8 F (36.6 C) (Oral)  Resp 16  Ht 5\' 6"  (1.676 m)  Wt 161 lb (73.029 kg)  BMI 25.99 kg/m2  SpO2 99%       Objective:   Physical Exam  Constitutional: She appears well-developed and well-nourished. No distress.  Cardiovascular: Normal rate and regular rhythm.   No murmur heard. Pulmonary/Chest: Effort normal and breath sounds normal. No respiratory distress. She has no wheezes. She has no rales. She exhibits no tenderness.  Musculoskeletal: She exhibits no edema.  Psychiatric: She has a normal mood and affect. Her behavior is normal. Judgment and thought content normal.          Assessment & Plan:

## 2011-11-10 NOTE — Assessment & Plan Note (Signed)
Stable on Adderall, continue same.  

## 2011-11-10 NOTE — Assessment & Plan Note (Signed)
Plan to treat with metronidazole tabs.  She will need to return for wet prep is symptoms worsen, or if they do not resolve. Pt verbalizes understanding.

## 2011-11-10 NOTE — Assessment & Plan Note (Signed)
Pt has quit and stayed quit.  I commended her for this.

## 2011-11-10 NOTE — Assessment & Plan Note (Signed)
She reports + nasal congestion. Will add Flonase.

## 2011-11-10 NOTE — Assessment & Plan Note (Signed)
Currently stable off of meds.  Continue to monitor.

## 2011-11-10 NOTE — Patient Instructions (Signed)
Please follow up in 3 months. Sooner if problems/concerns.  

## 2011-12-11 ENCOUNTER — Telehealth: Payer: Self-pay | Admitting: Family

## 2011-12-11 MED ORDER — AMPHETAMINE-DEXTROAMPHET ER 20 MG PO CP24: 20.0000 mg | ORAL_CAPSULE | ORAL | Status: DC

## 2011-12-11 NOTE — Telephone Encounter (Signed)
Patient would like a new prescription for adderall

## 2011-12-11 NOTE — Telephone Encounter (Signed)
Rx printed and forwarded to Provider for signature. Pt aware that rx will not be ready until tomorrow. Pt states she has enough to last until Monday and will pick Rx up then. Advised pt I would call and let her know once it was signed.

## 2011-12-12 NOTE — Telephone Encounter (Signed)
Attempted to reach pt and left detailed message on cell# of Rx completion and to call if any questions.

## 2011-12-12 NOTE — Telephone Encounter (Signed)
Signed.

## 2012-02-02 ENCOUNTER — Encounter: Payer: Self-pay | Admitting: Family

## 2012-02-02 ENCOUNTER — Ambulatory Visit (INDEPENDENT_AMBULATORY_CARE_PROVIDER_SITE_OTHER): Payer: 59 | Admitting: Family

## 2012-02-02 VITALS — BP 102/72 | HR 65 | Temp 97.9°F | Resp 16 | Ht 66.0 in | Wt 167.0 lb

## 2012-02-02 DIAGNOSIS — F341 Dysthymic disorder: Secondary | ICD-10-CM

## 2012-02-02 DIAGNOSIS — J309 Allergic rhinitis, unspecified: Secondary | ICD-10-CM

## 2012-02-02 DIAGNOSIS — F988 Other specified behavioral and emotional disorders with onset usually occurring in childhood and adolescence: Secondary | ICD-10-CM

## 2012-02-02 DIAGNOSIS — F418 Other specified anxiety disorders: Secondary | ICD-10-CM

## 2012-02-02 MED ORDER — AMPHETAMINE-DEXTROAMPHET ER 20 MG PO CP24: 20.0000 mg | ORAL_CAPSULE | ORAL | Status: DC

## 2012-02-02 MED ORDER — AMPHETAMINE-DEXTROAMPHETAMINE 10 MG PO TABS: ORAL_TABLET | ORAL | Status: DC

## 2012-02-02 NOTE — Assessment & Plan Note (Signed)
Stable off of meds.  Monitor.  

## 2012-02-02 NOTE — Assessment & Plan Note (Signed)
Had some difficulty with nasal irritation with flonase, plans to add allegra back in.

## 2012-02-02 NOTE — Patient Instructions (Addendum)
Please follow up in 2 months. 

## 2012-02-02 NOTE — Assessment & Plan Note (Signed)
Deteriorated.  She is having difficulty focusing in the afternoon.  Will add 10mg  regular release adderall in the afternoon.

## 2012-02-02 NOTE — Progress Notes (Signed)
Subjective:    Patient ID: [name], female    DOB: 1970/10/17, 41 y.o.   MRN: [id number]  HPI  [name] is a 41 yr old female who presents today for follow up.  1) Depression-  She remains off of meds. Feels well.  Stress has eased up considerably.   2) ADD- currently on Adderall XR 20mg .  She reports that in the afternoon 1-3 PM, she starts feeling scattered again.     3) Allergic rhinitis- backed off on flonase due to some nasal irritation.  Woke up this AM with nasal congestion, plans to add back in allegra.    Review of Systems See HPI  Past Medical History  Diagnosis Date  . Asthma   . Allergy     allergic rhinitis  . ADD (attention deficit disorder)   . Adjustment disorder     without depression  . Thyroid disease     hyperthyroidism / Graves Disease    History   Social History  . Marital Status: Single    Spouse Name: N/A    Number of Children: 1  . Years of Education: N/A   Occupational History  . RN    Social History Main Topics  . Smoking status: Former Smoker    Types: Cigarettes    Quit date: 07/19/2011  . Smokeless tobacco: Never Used  . Alcohol Use: No  . Drug Use: Not on file  . Sexually Active: Not on file   Other Topics Concern  . Not on file   Social History Narrative  . No narrative on file    No past surgical history on file.  Family History  Problem Relation Age of Onset  . Diabetes Other   . Stroke Other   . Hypertension Other   . Thyroid disease Other   . Migraines Other     Allergies  Allergen Reactions  . Escitalopram Oxalate   . Montelukast Sodium     Current Outpatient Prescriptions on File Prior to Visit  Medication Sig Dispense Refill  . albuterol (PROAIR HFA) 108 (90 BASE) MCG/ACT inhaler Inhale 2 puffs into the lungs every 6 (six) hours. As needed and before exercise.  3.7 g  2  . fexofenadine (ALLEGRA) 180 MG tablet Take 180 mg by mouth daily as needed.       . fluticasone (FLONASE) 50 MCG/ACT  nasal spray Place 2 sprays into the nose daily.  16 g  3  . Mometasone Furo-Formoterol Fum (DULERA) 200-5 MCG/ACT AERO Inhale 1 puff into the lungs 2 (two) times daily.  1 Inhaler  3  . DISCONTD: amphetamine-dextroamphetamine (ADDERALL XR) 20 MG 24 hr capsule Take 1 capsule (20 mg total) by mouth every morning.  30 capsule  0  . amphetamine-dextroamphetamine (ADDERALL XR) 20 MG 24 hr capsule Take 1 capsule (20 mg total) by mouth every morning.  30 capsule  0  . DISCONTD: Norethindrone-Ethinyl Estradiol-Fe Biphas (LO LOESTRIN FE) 1 MG-10 MCG / 10 MCG tablet Take 1 tablet by mouth daily.          BP 102/72  Pulse 65  Temp 97.9 F (36.6 C) (Oral)  Resp 16  Ht 5\' 6"  (1.676 m)  Wt 167 lb 0.6 oz (75.769 kg)  BMI 26.96 kg/m2  SpO2 99%  LMP 02/02/2012       Objective:   Physical Exam  Constitutional: She appears well-developed and well-nourished. No distress.  HENT:  Head: Normocephalic and atraumatic.  Right Ear: Tympanic membrane  and ear canal normal.  Left Ear: Tympanic membrane and ear canal normal.  Cardiovascular: Normal rate and regular rhythm.   No murmur heard. Pulmonary/Chest: Effort normal and breath sounds normal. No respiratory distress. She has no wheezes. She has no rales. She exhibits no tenderness.  Musculoskeletal: She exhibits no edema.  Psychiatric: She has a normal mood and affect. Her behavior is normal. Judgment and thought content normal.          Assessment & Plan:

## 2012-03-11 ENCOUNTER — Telehealth: Payer: Self-pay | Admitting: *Deleted

## 2012-03-11 MED ORDER — AMPHETAMINE-DEXTROAMPHET ER 20 MG PO CP24: 20.0000 mg | ORAL_CAPSULE | ORAL | Status: DC

## 2012-03-11 NOTE — Telephone Encounter (Signed)
Received message from pt requesting refill of adderall xr. Last rx printed on 02/02/12.  Pt has follow up in August. Rx printed and forwarded to Provider for signature.

## 2012-03-11 NOTE — Telephone Encounter (Signed)
Left detailed message on pt's voicemail that Rx is ready to be picked up and to call if any questions.

## 2012-03-29 ENCOUNTER — Encounter: Payer: Self-pay | Admitting: Family

## 2012-03-29 ENCOUNTER — Ambulatory Visit (INDEPENDENT_AMBULATORY_CARE_PROVIDER_SITE_OTHER): Payer: 59 | Admitting: Family

## 2012-03-29 VITALS — BP 100/60 | HR 66 | Temp 97.9°F | Resp 18 | Ht 66.0 in | Wt 169.0 lb

## 2012-03-29 DIAGNOSIS — F988 Other specified behavioral and emotional disorders with onset usually occurring in childhood and adolescence: Secondary | ICD-10-CM

## 2012-03-29 DIAGNOSIS — F418 Other specified anxiety disorders: Secondary | ICD-10-CM

## 2012-03-29 DIAGNOSIS — E0500 Thyrotoxicosis with diffuse goiter without thyrotoxic crisis or storm: Secondary | ICD-10-CM

## 2012-03-29 DIAGNOSIS — R635 Abnormal weight gain: Secondary | ICD-10-CM | POA: Insufficient documentation

## 2012-03-29 DIAGNOSIS — F341 Dysthymic disorder: Secondary | ICD-10-CM

## 2012-03-29 MED ORDER — AMPHETAMINE-DEXTROAMPHETAMINE 10 MG PO TABS: ORAL_TABLET | ORAL | Status: DC

## 2012-03-29 NOTE — Patient Instructions (Addendum)
Try to exercise 30 minutes a day, 5 days a week.  Follow up in 3 months.

## 2012-03-29 NOTE — Assessment & Plan Note (Signed)
Stable on current dosing regimen.  Rx provided for 8 tabs of the Adderall 10mg  to get her two prescriptions on the same cycle.

## 2012-03-29 NOTE — Assessment & Plan Note (Signed)
This is currently well controlled off of meds.  Monitor.

## 2012-03-29 NOTE — Progress Notes (Signed)
Subjective:    Patient ID: [name], female    DOB: 11-11-1970, 41 y.o.   MRN: [id number]  HPI  Ms.  [name] is a 41 yr old female who presents today for follow up.  1) Depression- pt remains off of medication.   2) ADD- last visit she noted a decrease in her focus in the afternoons.  Regular release Adderall 10mg  was added in the afternoon.  She reports that she is staying better focused with this dosing schedule.  3) weight gain- Pt has gained 25 pounds in the last year (15 of these pounds since Christmas).  She attributes this weigh gain to poor diet and lack of exercise.  Plans to resume regular exercise routine.    Review of Systems See HPI  Past Medical History  Diagnosis Date  . Asthma   . Allergy     allergic rhinitis  . ADD (attention deficit disorder)   . Adjustment disorder     without depression  . Thyroid disease     hyperthyroidism / Graves Disease    History   Social History  . Marital Status: Single    Spouse Name: N/A    Number of Children: 1  . Years of Education: N/A   Occupational History  . RN    Social History Main Topics  . Smoking status: Former Smoker    Types: Cigarettes    Quit date: 07/19/2011  . Smokeless tobacco: Never Used  . Alcohol Use: No  . Drug Use: Not on file  . Sexually Active: Not on file   Other Topics Concern  . Not on file   Social History Narrative  . No narrative on file    No past surgical history on file.  Family History  Problem Relation Age of Onset  . Diabetes Other   . Stroke Other   . Hypertension Other   . Thyroid disease Other   . Migraines Other     Allergies  Allergen Reactions  . Escitalopram Oxalate   . Montelukast Sodium     Current Outpatient Prescriptions on File Prior to Visit  Medication Sig Dispense Refill  . albuterol (PROAIR HFA) 108 (90 BASE) MCG/ACT inhaler Inhale 2 puffs into the lungs every 6 (six) hours. As needed and before exercise.  3.7 g  2  .  amphetamine-dextroamphetamine (ADDERALL XR) 20 MG 24 hr capsule Take 1 capsule (20 mg total) by mouth every morning.  30 capsule  0  . fexofenadine (ALLEGRA) 180 MG tablet Take 180 mg by mouth daily as needed.       . fluticasone (FLONASE) 50 MCG/ACT nasal spray Place 2 sprays into the nose daily.  16 g  3  . Mometasone Furo-Formoterol Fum (DULERA) 200-5 MCG/ACT AERO Inhale 1 puff into the lungs 2 (two) times daily.  1 Inhaler  3  . DISCONTD: amphetamine-dextroamphetamine (ADDERALL) 10 MG tablet One tablet once daily in the afternoon  30 tablet  0  . DISCONTD: amphetamine-dextroamphetamine (ADDERALL XR) 20 MG 24 hr capsule Take 1 capsule (20 mg total) by mouth every morning.  30 capsule  0  . DISCONTD: Norethindrone-Ethinyl Estradiol-Fe Biphas (LO LOESTRIN FE) 1 MG-10 MCG / 10 MCG tablet Take 1 tablet by mouth daily.          BP 100/60  Pulse 66  Temp 97.9 F (36.6 C) (Oral)  Resp 18  Ht 5\' 6"  (1.676 m)  Wt 169 lb (76.658 kg)  BMI 27.28 kg/m2  SpO2 99%  LMP 03/29/2012       Objective:   Physical Exam  Constitutional: She appears well-developed and well-nourished. No distress.  Psychiatric: She has a normal mood and affect. Her behavior is normal. Judgment and thought content normal.          Assessment & Plan:

## 2012-03-29 NOTE — Assessment & Plan Note (Signed)
Pt counseled on healthy diet, exercise, weight loss.

## 2012-03-30 ENCOUNTER — Encounter: Payer: Self-pay | Admitting: Family

## 2012-04-27 ENCOUNTER — Other Ambulatory Visit: Payer: Self-pay | Admitting: *Deleted

## 2012-04-27 MED ORDER — AMPHETAMINE-DEXTROAMPHETAMINE 10 MG PO TABS: ORAL_TABLET | ORAL | Status: DC

## 2012-04-27 MED ORDER — AMPHETAMINE-DEXTROAMPHET ER 20 MG PO CP24: 20.0000 mg | ORAL_CAPSULE | ORAL | Status: DC

## 2012-04-27 NOTE — Telephone Encounter (Signed)
Pt left message requesting refills on adderall xr and adderall. Adderall XR 20mg  last printed on 03/11/12, adderall 10mg  last printed on 03/29/12. Pt has f/u in November.  Rxs printed and forwarded to Provider for signature.

## 2012-04-28 NOTE — Telephone Encounter (Signed)
Notified pt that rxs have been placed at the front desk for pick up.

## 2012-06-18 ENCOUNTER — Other Ambulatory Visit: Payer: Self-pay | Admitting: *Deleted

## 2012-06-18 MED ORDER — AMPHETAMINE-DEXTROAMPHET ER 20 MG PO CP24: 20.0000 mg | ORAL_CAPSULE | ORAL | Status: DC

## 2012-06-18 MED ORDER — AMPHETAMINE-DEXTROAMPHETAMINE 10 MG PO TABS: ORAL_TABLET | ORAL | Status: DC

## 2012-06-18 NOTE — Telephone Encounter (Signed)
Pt left message for refill of adderall 10mg  and adderall xr 20mg . Rx last printed 04/27/12, Rx printed and forwarded to provider for signature.

## 2012-06-18 NOTE — Telephone Encounter (Signed)
Rxs signed and placed at front desk for pick up. Left detailed message on pt's cell re: rx completion and to call if any questions.

## 2012-06-29 ENCOUNTER — Ambulatory Visit: Payer: 59 | Admitting: Family

## 2012-07-02 ENCOUNTER — Ambulatory Visit: Payer: 59 | Admitting: Family

## 2012-07-23 ENCOUNTER — Ambulatory Visit: Payer: Self-pay | Admitting: Family

## 2012-07-23 ENCOUNTER — Encounter: Payer: Self-pay | Admitting: Family

## 2012-07-23 ENCOUNTER — Ambulatory Visit (INDEPENDENT_AMBULATORY_CARE_PROVIDER_SITE_OTHER): Payer: 59 | Admitting: Family

## 2012-07-23 VITALS — BP 112/80 | HR 70 | Temp 98.0°F | Resp 16 | Ht 66.0 in | Wt 171.0 lb

## 2012-07-23 DIAGNOSIS — F418 Other specified anxiety disorders: Secondary | ICD-10-CM

## 2012-07-23 DIAGNOSIS — F988 Other specified behavioral and emotional disorders with onset usually occurring in childhood and adolescence: Secondary | ICD-10-CM

## 2012-07-23 DIAGNOSIS — F341 Dysthymic disorder: Secondary | ICD-10-CM

## 2012-07-23 DIAGNOSIS — J40 Bronchitis, not specified as acute or chronic: Secondary | ICD-10-CM

## 2012-07-23 MED ORDER — AMPHETAMINE-DEXTROAMPHET ER 20 MG PO CP24: 20.0000 mg | ORAL_CAPSULE | ORAL | Status: DC

## 2012-07-23 MED ORDER — ALBUTEROL SULFATE HFA 108 (90 BASE) MCG/ACT IN AERS: 2.0000 | INHALATION_SPRAY | Freq: Four times a day (QID) | RESPIRATORY_TRACT | Status: DC

## 2012-07-23 MED ORDER — MOMETASONE FURO-FORMOTEROL FUM 200-5 MCG/ACT IN AERO: 1.0000 | INHALATION_SPRAY | Freq: Two times a day (BID) | RESPIRATORY_TRACT | Status: DC

## 2012-07-23 MED ORDER — FLUCONAZOLE 150 MG PO TABS: ORAL_TABLET | ORAL | Status: DC

## 2012-07-23 MED ORDER — AMPHETAMINE-DEXTROAMPHETAMINE 10 MG PO TABS: ORAL_TABLET | ORAL | Status: DC

## 2012-07-23 MED ORDER — AZITHROMYCIN 250 MG PO TABS: ORAL_TABLET | ORAL | Status: DC

## 2012-07-23 NOTE — Patient Instructions (Addendum)
Please follow up in 3 months. Call if symptoms worsen or if not improved in 2-3 days.

## 2012-07-23 NOTE — Assessment & Plan Note (Signed)
Clinically stable.  Will continue to monitor. 

## 2012-07-23 NOTE — Progress Notes (Signed)
Subjective:    Patient ID: [name], female    DOB: Aug 13, 1971, 41 y.o.   MRN: [id number]  HPI  [name] is  41 yr old female who presents today for follow up.    Chest congestion- started as a head cold.  Now gone to chest.  Sputum has foul tastes.  She had exposure to her step son.  Symptoms started 6 days ago. Using mucinex which helps some.  She has green nasal discharge.  Sputum is dark green in color.  She denies fever.    Depression/anxiety- well controlled, did not meet with counselor.  ADD- She continues the adderall.    Review of Systems See HPI  Past Medical History  Diagnosis Date  . Asthma   . Allergy     allergic rhinitis  . ADD (attention deficit disorder)   . Adjustment disorder     without depression  . Thyroid disease     hyperthyroidism / Graves Disease    History   Social History  . Marital Status: Single    Spouse Name: N/A    Number of Children: 1  . Years of Education: N/A   Occupational History  . RN    Social History Main Topics  . Smoking status: Former Smoker    Types: Cigarettes    Quit date: 07/19/2011  . Smokeless tobacco: Never Used  . Alcohol Use: No  . Drug Use: Not on file  . Sexually Active: Not on file   Other Topics Concern  . Not on file   Social History Narrative  . No narrative on file    No past surgical history on file.  Family History  Problem Relation Age of Onset  . Diabetes Other   . Stroke Other   . Hypertension Other   . Thyroid disease Other   . Migraines Other     Allergies  Allergen Reactions  . Escitalopram Oxalate   . Montelukast Sodium     Current Outpatient Prescriptions on File Prior to Visit  Medication Sig Dispense Refill  . albuterol (PROAIR HFA) 108 (90 BASE) MCG/ACT inhaler Inhale 2 puffs into the lungs every 6 (six) hours. As needed and before exercise.  3.7 g  2  . amphetamine-dextroamphetamine (ADDERALL XR) 20 MG 24 hr capsule Take 1 capsule (20 mg total) by mouth every  morning.  30 capsule  0  . amphetamine-dextroamphetamine (ADDERALL) 10 MG tablet One tablet once daily in the afternoon  8 tablet  0  . Mometasone Furo-Formoterol Fum (DULERA) 200-5 MCG/ACT AERO Inhale 1 puff into the lungs 2 (two) times daily.  1 Inhaler  3  . fexofenadine (ALLEGRA) 180 MG tablet Take 180 mg by mouth daily as needed.       . fluticasone (FLONASE) 50 MCG/ACT nasal spray Place 2 sprays into the nose daily.  16 g  3  . [DISCONTINUED] Norethindrone-Ethinyl Estradiol-Fe Biphas (LO LOESTRIN FE) 1 MG-10 MCG / 10 MCG tablet Take 1 tablet by mouth daily.          BP 112/80  Pulse 70  Temp 98 F (36.7 C) (Oral)  Resp 16  Ht 5\' 6"  (1.676 m)  Wt 171 lb (77.565 kg)  BMI 27.60 kg/m2  SpO2 98%  LMP 07/16/2012       Objective:   Physical Exam  Constitutional: She is oriented to person, place, and time. She appears well-developed and well-nourished. No distress.  HENT:  Head: Normocephalic and atraumatic.  Right Ear: Tympanic  membrane and ear canal normal.  Left Ear: Tympanic membrane and ear canal normal.  Mouth/Throat: No oropharyngeal exudate, posterior oropharyngeal edema or posterior oropharyngeal erythema.  Cardiovascular: Normal rate and regular rhythm.   No murmur heard. Pulmonary/Chest: Effort normal and breath sounds normal. No respiratory distress. She has no wheezes. She has no rales. She exhibits no tenderness.  Musculoskeletal: She exhibits no edema.  Neurological: She is alert and oriented to person, place, and time.  Skin: Skin is warm and dry.  Psychiatric: She has a normal mood and affect. Her behavior is normal. Judgment and thought content normal.          Assessment & Plan:

## 2012-07-23 NOTE — Assessment & Plan Note (Signed)
Stable on current Adderall regimen.

## 2012-07-23 NOTE — Assessment & Plan Note (Signed)
Will rx with zithromax.   

## 2012-09-01 ENCOUNTER — Other Ambulatory Visit: Payer: Self-pay | Admitting: *Deleted

## 2012-09-01 MED ORDER — AMPHETAMINE-DEXTROAMPHETAMINE 10 MG PO TABS: ORAL_TABLET | ORAL | Status: DC

## 2012-09-01 MED ORDER — AMPHETAMINE-DEXTROAMPHET ER 20 MG PO CP24: 20.0000 mg | ORAL_CAPSULE | ORAL | Status: DC

## 2012-09-01 NOTE — Telephone Encounter (Signed)
Pt left message requesting refills of Adderall XR and adderall. Rx's printed, #30 x no refills each and forwarded to Provider for signature.

## 2012-09-01 NOTE — Telephone Encounter (Signed)
Signed.

## 2012-09-01 NOTE — Telephone Encounter (Signed)
Left detailed message on cell# that rxs are at front desk for pick up.

## 2012-10-02 ENCOUNTER — Other Ambulatory Visit: Payer: Self-pay

## 2012-10-14 ENCOUNTER — Telehealth: Payer: Self-pay | Admitting: *Deleted

## 2012-10-14 MED ORDER — AMPHETAMINE-DEXTROAMPHET ER 20 MG PO CP24: 20.0000 mg | ORAL_CAPSULE | ORAL | Status: DC

## 2012-10-14 MED ORDER — AMPHETAMINE-DEXTROAMPHETAMINE 10 MG PO TABS: ORAL_TABLET | ORAL | Status: DC

## 2012-10-14 NOTE — Telephone Encounter (Signed)
Rx signed and placed at front desk for pt to pick up. Notified pt, she will have someone pick up Rx for her.

## 2012-10-14 NOTE — Telephone Encounter (Signed)
Received call from pt requesting written Rxs for adderall and adderall XR. Rxs last printed on 09/01/12. Rxs printed and forwarded to Provider for signature.

## 2012-10-22 ENCOUNTER — Ambulatory Visit: Payer: Self-pay | Admitting: Family

## 2012-10-27 ENCOUNTER — Encounter: Payer: Self-pay | Admitting: Family

## 2012-10-27 ENCOUNTER — Ambulatory Visit (INDEPENDENT_AMBULATORY_CARE_PROVIDER_SITE_OTHER): Payer: 59 | Admitting: Family

## 2012-10-27 VITALS — BP 104/80 | HR 71 | Temp 98.4°F | Resp 16 | Ht 66.0 in | Wt 173.0 lb

## 2012-10-27 DIAGNOSIS — F3289 Other specified depressive episodes: Secondary | ICD-10-CM

## 2012-10-27 DIAGNOSIS — F341 Dysthymic disorder: Secondary | ICD-10-CM

## 2012-10-27 DIAGNOSIS — F418 Other specified anxiety disorders: Secondary | ICD-10-CM

## 2012-10-27 DIAGNOSIS — F329 Major depressive disorder, single episode, unspecified: Secondary | ICD-10-CM

## 2012-10-27 DIAGNOSIS — F988 Other specified behavioral and emotional disorders with onset usually occurring in childhood and adolescence: Secondary | ICD-10-CM

## 2012-10-27 DIAGNOSIS — F32A Depression, unspecified: Secondary | ICD-10-CM

## 2012-10-27 NOTE — Assessment & Plan Note (Signed)
Depression is deteriorated. Increase wellbutrin to BID as tolerated. Refer to therapist.

## 2012-10-27 NOTE — Patient Instructions (Addendum)
You will be contacted about your referral for the therapist. Please let us know if you have not heard back within 1 week about your referral. Please follow up in 2 months.

## 2012-10-27 NOTE — Progress Notes (Signed)
Subjective:    Patient ID: [name], female    DOB: February 09, 1971, 42 y.o.   MRN: [id number]  HPI  Depression- reports that she is "up and down" with her depression.  GYN started her back on wellbutrin. She is taking once a day.  Started this last week. Reports that she spends the whole day in bed on her day off. Feels that she is pushing away the relationships in her life.  Not enjoying the things she used to like to do. Has a new job at [employer] which she feels she should be excited about but somehow feels "blah" about it.   ADD- reports that this is well controlled on adderall.    Review of Systems See HPI  Past Medical History  Diagnosis Date  . Asthma   . Allergy     allergic rhinitis  . ADD (attention deficit disorder)   . Adjustment disorder     without depression  . Thyroid disease     hyperthyroidism / Graves Disease    History   Social History  . Marital Status: Single    Spouse Name: N/A    Number of Children: 1  . Years of Education: N/A   Occupational History  . RN    Social History Main Topics  . Smoking status: Former Smoker    Types: Cigarettes    Quit date: 07/19/2011  . Smokeless tobacco: Never Used  . Alcohol Use: No  . Drug Use: Not on file  . Sexually Active: Not on file   Other Topics Concern  . Not on file   Social History Narrative  . No narrative on file    No past surgical history on file.  Family History  Problem Relation Age of Onset  . Diabetes Other   . Stroke Other   . Hypertension Other   . Thyroid disease Other   . Migraines Other     Allergies  Allergen Reactions  . Escitalopram Oxalate   . Montelukast Sodium     Current Outpatient Prescriptions on File Prior to Visit  Medication Sig Dispense Refill  . albuterol (PROAIR HFA) 108 (90 BASE) MCG/ACT inhaler Inhale 2 puffs into the lungs every 6 (six) hours. As needed and before exercise.  3.7 g  2  . amphetamine-dextroamphetamine (ADDERALL XR) 20 MG 24 hr  capsule Take 1 capsule (20 mg total) by mouth every morning.  30 capsule  0  . amphetamine-dextroamphetamine (ADDERALL) 10 MG tablet One tablet once daily in the afternoon  30 tablet  0  . azithromycin (ZITHROMAX) 250 MG tablet 2 tabs by mouth today, then one tablet by mouth daily for 4 more days  6 tablet  0  . fexofenadine (ALLEGRA) 180 MG tablet Take 180 mg by mouth daily as needed.       . fluconazole (DIFLUCAN) 150 MG tablet One tablet by mouth once as needed for yeast infection.  1 tablet  0  . fluticasone (FLONASE) 50 MCG/ACT nasal spray Place 2 sprays into the nose daily.  16 g  3  . mometasone-formoterol (DULERA) 200-5 MCG/ACT AERO Inhale 1 puff into the lungs 2 (two) times daily.  1 Inhaler  3  . [DISCONTINUED] Norethindrone-Ethinyl Estradiol-Fe Biphas (LO LOESTRIN FE) 1 MG-10 MCG / 10 MCG tablet Take 1 tablet by mouth daily.         No current facility-administered medications on file prior to visit.    BP 104/80  Pulse 71  Temp(Src) 98.4 F (  36.9 C) (Oral)  Resp 16  Ht 5\' 6"  (1.676 m)  Wt 173 lb (78.472 kg)  BMI 27.94 kg/m2  SpO2 99%       Objective:   Physical Exam  Constitutional: She appears well-developed and well-nourished. No distress.  Psychiatric: She has a normal mood and affect. Her behavior is normal. Judgment and thought content normal.          Assessment & Plan:  15 minutes spent with pt today. > 50% of this time was spent counseling pt on her depression.

## 2012-10-27 NOTE — Assessment & Plan Note (Signed)
Well controlled on adderall.  Monitor.

## 2012-11-29 ENCOUNTER — Ambulatory Visit (INDEPENDENT_AMBULATORY_CARE_PROVIDER_SITE_OTHER): Payer: 59 | Admitting: Licensed Clinical Social Worker

## 2012-11-29 DIAGNOSIS — F331 Major depressive disorder, recurrent, moderate: Secondary | ICD-10-CM

## 2012-12-13 ENCOUNTER — Ambulatory Visit: Payer: PRIVATE HEALTH INSURANCE | Admitting: Licensed Clinical Social Worker

## 2012-12-17 ENCOUNTER — Ambulatory Visit: Payer: PRIVATE HEALTH INSURANCE | Admitting: Family

## 2012-12-20 ENCOUNTER — Ambulatory Visit: Payer: Self-pay | Admitting: Family

## 2012-12-20 ENCOUNTER — Telehealth: Payer: Self-pay | Admitting: *Deleted

## 2012-12-20 MED ORDER — AMPHETAMINE-DEXTROAMPHETAMINE 10 MG PO TABS: ORAL_TABLET | ORAL | Status: DC

## 2012-12-20 MED ORDER — AMPHETAMINE-DEXTROAMPHET ER 20 MG PO CP24: 20.0000 mg | ORAL_CAPSULE | ORAL | Status: DC

## 2012-12-20 NOTE — Telephone Encounter (Signed)
Pt left message requesting written rxs for adderall xr and adderall. Last rxs printed 10/14/12. Rxs printed and forwarded to Provider for signature.

## 2012-12-21 NOTE — Telephone Encounter (Signed)
Rxs placed at front desk for pick up and pt has been notified. 

## 2012-12-23 ENCOUNTER — Encounter: Payer: Self-pay | Admitting: Family Medicine

## 2012-12-23 ENCOUNTER — Ambulatory Visit (INDEPENDENT_AMBULATORY_CARE_PROVIDER_SITE_OTHER): Payer: 59 | Admitting: Family Medicine

## 2012-12-23 VITALS — BP 110/68 | HR 88 | Temp 98.2°F | Ht 66.0 in | Wt 165.1 lb

## 2012-12-23 DIAGNOSIS — J209 Acute bronchitis, unspecified: Secondary | ICD-10-CM

## 2012-12-23 DIAGNOSIS — J45909 Unspecified asthma, uncomplicated: Secondary | ICD-10-CM

## 2012-12-23 DIAGNOSIS — J40 Bronchitis, not specified as acute or chronic: Secondary | ICD-10-CM

## 2012-12-23 MED ORDER — FLUCONAZOLE 150 MG PO TABS: ORAL_TABLET | ORAL | Status: DC

## 2012-12-23 MED ORDER — HYDROCOD POLST-CHLORPHEN POLST 10-8 MG/5ML PO LQCR: 5.0000 mL | Freq: Every evening | ORAL | Status: DC | PRN

## 2012-12-23 MED ORDER — DOXYCYCLINE HYCLATE 50 MG PO CAPS: 50.0000 mg | ORAL_CAPSULE | Freq: Two times a day (BID) | ORAL | Status: DC

## 2012-12-23 MED ORDER — METHYLPREDNISOLONE 4 MG PO KIT: PACK | ORAL | Status: DC

## 2012-12-23 MED ORDER — GUAIFENESIN ER 600 MG PO TB12: 600.0000 mg | ORAL_TABLET | Freq: Two times a day (BID) | ORAL | Status: AC

## 2012-12-23 NOTE — Patient Instructions (Addendum)
Probiotic such as Digestive Advantage   Bronchitis Bronchitis is the body's way of reacting to injury and/or infection (inflammation) of the bronchi. Bronchi are the air tubes that extend from the windpipe into the lungs. If the inflammation becomes severe, it may cause shortness of breath. CAUSES  Inflammation may be caused by:  A virus.  Germs (bacteria).  Dust.  Allergens.  Pollutants and many other irritants. The cells lining the bronchial tree are covered with tiny hairs (cilia). These constantly beat upward, away from the lungs, toward the mouth. This keeps the lungs free of pollutants. When these cells become too irritated and are unable to do their job, mucus begins to develop. This causes the characteristic cough of bronchitis. The cough clears the lungs when the cilia are unable to do their job. Without either of these protective mechanisms, the mucus would settle in the lungs. Then you would develop pneumonia. Smoking is a common cause of bronchitis and can contribute to pneumonia. Stopping this habit is the single most important thing you can do to help yourself. TREATMENT   Your caregiver may prescribe an antibiotic if the cough is caused by bacteria. Also, medicines that open up your airways make it easier to breathe. Your caregiver may also recommend or prescribe an expectorant. It will loosen the mucus to be coughed up. Only take over-the-counter or prescription medicines for pain, discomfort, or fever as directed by your caregiver.  Removing whatever causes the problem (smoking, for example) is critical to preventing the problem from getting worse.  Cough suppressants may be prescribed for relief of cough symptoms.  Inhaled medicines may be prescribed to help with symptoms now and to help prevent problems from returning.  For those with recurrent (chronic) bronchitis, there may be a need for steroid medicines. SEEK IMMEDIATE MEDICAL CARE IF:   During treatment, you  develop more pus-like mucus (purulent sputum).  You have a fever.  Your baby is older than 3 months with a rectal temperature of 102 F (38.9 C) or higher.  Your baby is 71 months old or younger with a rectal temperature of 100.4 F (38 C) or higher.  You become progressively more ill.  You have increased difficulty breathing, wheezing, or shortness of breath. It is necessary to seek immediate medical care if you are elderly or sick from any other disease. MAKE SURE YOU:   Understand these instructions.  Will watch your condition.  Will get help right away if you are not doing well or get worse. Document Released: 08/04/2005 Document Revised: 10/27/2011 Document Reviewed: 06/13/2008 [facility] Patient Information 2013 [place], [place].

## 2012-12-23 NOTE — Progress Notes (Signed)
Patient ID: [name], female   DOB: 09/26/1970, 42 y.o.   MRN: [id number] ERICAH SCOTTO [id number] 1971/06/17 12/23/2012      Progress Note-Follow Up  Subjective  Chief Complaint  Chief Complaint  Patient presents with  . Cough    w/phlegm- dark green X 8 days    HPI  Patient is a 42 year old Caucasian female who is in today with roughly 8 days with a worsening cough. Cough is productive of green phlegm. She's had congestion of her productive of green phlegm. She's been coughing so bad that she's having trouble sleeping and has a sore throat. She notes her cough causes chest pain when she's coughing. She describes irritation in her throat as well. She's been using multiple over-the-counter medications including Aleve cold and sinus with minimal relief. She's had intermittent fevers malaise myalgias as well.  Past Medical History  Diagnosis Date  . Asthma   . Allergy     allergic rhinitis  . ADD (attention deficit disorder)   . Adjustment disorder     without depression  . Thyroid disease     hyperthyroidism / Graves Disease    History reviewed. No pertinent past surgical history.  Family History  Problem Relation Age of Onset  . Diabetes Other   . Stroke Other   . Hypertension Other   . Thyroid disease Other   . Migraines Other     History   Social History  . Marital Status: Single    Spouse Name: N/A    Number of Children: 1  . Years of Education: N/A   Occupational History  . RN    Social History Main Topics  . Smoking status: Former Smoker    Types: Cigarettes    Quit date: 07/19/2011  . Smokeless tobacco: Never Used  . Alcohol Use: No  . Drug Use: Not on file  . Sexually Active: Not on file   Other Topics Concern  . Not on file   Social History Narrative  . No narrative on file    Current Outpatient Prescriptions on File Prior to Visit  Medication Sig Dispense Refill  . albuterol (PROAIR HFA) 108 (90 BASE) MCG/ACT inhaler Inhale 2 puffs  into the lungs every 6 (six) hours. As needed and before exercise.  3.7 g  2  . amphetamine-dextroamphetamine (ADDERALL XR) 20 MG 24 hr capsule Take 1 capsule (20 mg total) by mouth every morning.  30 capsule  0  . amphetamine-dextroamphetamine (ADDERALL) 10 MG tablet One tablet once daily in the afternoon  30 tablet  0  . fexofenadine (ALLEGRA) 180 MG tablet Take 180 mg by mouth daily as needed.       . mometasone-formoterol (DULERA) 200-5 MCG/ACT AERO Inhale 1 puff into the lungs 2 (two) times daily.  1 Inhaler  3  . Norethin Ace-Eth Estrad-FE (MINASTRIN 24 FE) 1-20 MG-MCG(24) CHEW Chew 1 tablet by mouth daily.      . [DISCONTINUED] Norethindrone-Ethinyl Estradiol-Fe Biphas (LO LOESTRIN FE) 1 MG-10 MCG / 10 MCG tablet Take 1 tablet by mouth daily.         No current facility-administered medications on file prior to visit.    Allergies  Allergen Reactions  . Escitalopram Oxalate   . Montelukast Sodium     Review of Systems  Review of Systems  Constitutional: Positive for fever, chills and malaise/fatigue.  HENT: Positive for congestion and sore throat.   Eyes: Negative for discharge.  Respiratory: Positive for cough, sputum production and  shortness of breath. Negative for wheezing.   Cardiovascular: Negative for chest pain, palpitations and leg swelling.  Gastrointestinal: Negative for nausea, abdominal pain and diarrhea.  Genitourinary: Negative for dysuria.  Musculoskeletal: Positive for myalgias. Negative for falls.  Skin: Negative for rash.  Neurological: Negative for loss of consciousness and headaches.  Endo/Heme/Allergies: Negative for polydipsia.  Psychiatric/Behavioral: Negative for depression and suicidal ideas. The patient is not nervous/anxious and does not have insomnia.     Objective  BP 110/68  Pulse 88  Temp(Src) 98.2 F (36.8 C) (Oral)  Ht 5\' 6"  (1.676 m)  Wt 165 lb 1.9 oz (74.898 kg)  BMI 26.66 kg/m2  SpO2 97%  LMP 12/09/2012  Physical  Exam  Physical Exam  Constitutional: She is oriented to person, place, and time and well-developed, well-nourished, and in no distress. No distress.  HENT:  Head: Normocephalic and atraumatic.  Nasal mucosa and oropharynx boggy and erythematous  Eyes: Conjunctivae are normal.  Neck: Neck supple. No thyromegaly present.  Cardiovascular: Normal rate, regular rhythm and normal heart sounds.   No murmur heard. Pulmonary/Chest: Effort normal and breath sounds normal. She has no wheezes.  Abdominal: She exhibits no distension and no mass.  Musculoskeletal: She exhibits no edema.  Lymphadenopathy:    She has no cervical adenopathy.  Neurological: She is alert and oriented to person, place, and time.  Skin: Skin is warm and dry. No rash noted. She is not diaphoretic.  Psychiatric: Memory, affect and judgment normal.    Lab Results  Component Value Date   TSH 3.410 03/29/2012   Lab Results  Component Value Date   WBC 3.1* 06/30/2006   HGB 12.0 06/30/2006   HCT 35.8* 06/30/2006   MCV 92.4 06/30/2006   PLT 178 06/30/2006   Lab Results  Component Value Date   CREATININE 0.9 06/30/2006   BUN 15 06/30/2006   NA 140 06/30/2006   K 4.2 06/30/2006   CL 108 06/30/2006   CO2 27 06/30/2006   Lab Results  Component Value Date   ALT 11 06/30/2006   AST 21 06/30/2006   ALKPHOS 35* 06/30/2006   BILITOT 0.8 06/30/2006   Lab Results  Component Value Date   CHOL 137 06/30/2006   Lab Results  Component Value Date   HDL 64.3 06/30/2006   Lab Results  Component Value Date   LDLCALC 60 06/30/2006       Assessment & Plan  Bronchitis Given Doxycyline and Tussionex, increase rest and fluids, add Mucinex and probiotics and if not getting better given a medrol dose pak  ASTHMA Has started back on her Dulera with acute illness and encouraged to keep her Albuterol with her

## 2012-12-23 NOTE — Assessment & Plan Note (Signed)
Given Doxycyline and Tussionex, increase rest and fluids, add Mucinex and probiotics and if not getting better given a medrol dose pak

## 2012-12-23 NOTE — Assessment & Plan Note (Signed)
Has started back on her Dulera with acute illness and encouraged to keep her Albuterol with her

## 2012-12-28 ENCOUNTER — Ambulatory Visit: Payer: PRIVATE HEALTH INSURANCE | Admitting: Family

## 2012-12-31 ENCOUNTER — Ambulatory Visit: Payer: Self-pay | Admitting: Family

## 2012-12-31 DIAGNOSIS — Z0289 Encounter for other administrative examinations: Secondary | ICD-10-CM

## 2013-01-07 ENCOUNTER — Ambulatory Visit (INDEPENDENT_AMBULATORY_CARE_PROVIDER_SITE_OTHER): Payer: 59 | Admitting: Family

## 2013-01-07 ENCOUNTER — Encounter: Payer: Self-pay | Admitting: Family

## 2013-01-07 VITALS — BP 120/72 | HR 85 | Temp 98.2°F | Resp 16 | Ht 66.0 in | Wt 163.1 lb

## 2013-01-07 DIAGNOSIS — F418 Other specified anxiety disorders: Secondary | ICD-10-CM

## 2013-01-07 DIAGNOSIS — F341 Dysthymic disorder: Secondary | ICD-10-CM

## 2013-01-07 DIAGNOSIS — F988 Other specified behavioral and emotional disorders with onset usually occurring in childhood and adolescence: Secondary | ICD-10-CM

## 2013-01-07 NOTE — Assessment & Plan Note (Signed)
Stable off of wellbutrin. Continue therapy, monitor.

## 2013-01-07 NOTE — Patient Instructions (Addendum)
Continue you work with the therapist. Follow up in 6 months- sooner if problems/concerns.

## 2013-01-07 NOTE — Assessment & Plan Note (Signed)
Stable on Adderall. Continues same.

## 2013-01-07 NOTE — Progress Notes (Signed)
Subjective:    Patient ID: [name], female    DOB: October 29, 1970, 42 y.o.   MRN: [id number]  HPI  Depression- seeing a therapist which is helping. Could not tolerate wellbutrin due to insomnia.  Overall feeling better. Broke up with her fiance.   ADD- reports that her ADD is well controlled on current dose of adderall.  Review of Systems See HPI  Past Medical History  Diagnosis Date  . Asthma   . Allergy     allergic rhinitis  . ADD (attention deficit disorder)   . Adjustment disorder     without depression  . Thyroid disease     hyperthyroidism / Graves Disease    History   Social History  . Marital Status: Single    Spouse Name: N/A    Number of Children: 1  . Years of Education: N/A   Occupational History  . RN    Social History Main Topics  . Smoking status: Former Smoker    Types: Cigarettes    Quit date: 07/19/2011  . Smokeless tobacco: Never Used  . Alcohol Use: No  . Drug Use: Not on file  . Sexually Active: Not on file   Other Topics Concern  . Not on file   Social History Narrative  . No narrative on file    No past surgical history on file.  Family History  Problem Relation Age of Onset  . Diabetes Other   . Stroke Other   . Hypertension Other   . Thyroid disease Other   . Migraines Other     Allergies  Allergen Reactions  . Escitalopram Oxalate   . Montelukast Sodium     Current Outpatient Prescriptions on File Prior to Visit  Medication Sig Dispense Refill  . albuterol (PROAIR HFA) 108 (90 BASE) MCG/ACT inhaler Inhale 2 puffs into the lungs every 6 (six) hours. As needed and before exercise.  3.7 g  2  . amphetamine-dextroamphetamine (ADDERALL XR) 20 MG 24 hr capsule Take 1 capsule (20 mg total) by mouth every morning.  30 capsule  0  . amphetamine-dextroamphetamine (ADDERALL) 10 MG tablet One tablet once daily in the afternoon  30 tablet  0  . chlorpheniramine-HYDROcodone (TUSSIONEX PENNKINETIC ER) 10-8 MG/5ML LQCR Take 5  mLs by mouth at bedtime as needed.  140 mL  1  . fexofenadine (ALLEGRA) 180 MG tablet Take 180 mg by mouth daily as needed.       Marland Kitchen guaiFENesin (MUCINEX) 600 MG 12 hr tablet Take 1 tablet (600 mg total) by mouth 2 (two) times daily.  20 tablet  0  . mometasone-formoterol (DULERA) 200-5 MCG/ACT AERO Inhale 1 puff into the lungs 2 (two) times daily.  1 Inhaler  3  . Norethin Ace-Eth Estrad-FE (MINASTRIN 24 FE) 1-20 MG-MCG(24) CHEW Chew 1 tablet by mouth daily.      . [DISCONTINUED] Norethindrone-Ethinyl Estradiol-Fe Biphas (LO LOESTRIN FE) 1 MG-10 MCG / 10 MCG tablet Take 1 tablet by mouth daily.         No current facility-administered medications on file prior to visit.    BP 120/72  Pulse 85  Temp(Src) 98.2 F (36.8 C) (Oral)  Resp 16  Ht 5\' 6"  (1.676 m)  Wt 163 lb 1.3 oz (73.973 kg)  BMI 26.33 kg/m2  SpO2 99%  LMP 12/09/2012       Objective:   Physical Exam  Constitutional: She appears well-developed and well-nourished. No distress.  Psychiatric: She has a normal mood and affect.  Her behavior is normal. Judgment and thought content normal.          Assessment & Plan:  15 minutes spent with pt.  >50% of this time was spent counseling pt on add/depression.

## 2013-01-21 ENCOUNTER — Telehealth: Payer: Self-pay | Admitting: *Deleted

## 2013-01-21 MED ORDER — AMPHETAMINE-DEXTROAMPHET ER 20 MG PO CP24: 20.0000 mg | ORAL_CAPSULE | ORAL | Status: DC

## 2013-01-21 MED ORDER — AMPHETAMINE-DEXTROAMPHETAMINE 10 MG PO TABS: ORAL_TABLET | ORAL | Status: DC

## 2013-01-21 NOTE — Telephone Encounter (Signed)
Pt left message requesting refill of adderall. Rxs last printed 12/20/12. Rxs printed and forwarded to Provider for signature.

## 2013-01-21 NOTE — Telephone Encounter (Signed)
Rxs signed, left detailed message on pt's cell# and rxs placed at front desk for pick up.

## 2013-03-07 ENCOUNTER — Telehealth: Payer: Self-pay | Admitting: *Deleted

## 2013-03-07 MED ORDER — AMPHETAMINE-DEXTROAMPHET ER 20 MG PO CP24: 20.0000 mg | ORAL_CAPSULE | ORAL | Status: DC

## 2013-03-07 MED ORDER — AMPHETAMINE-DEXTROAMPHETAMINE 10 MG PO TABS: ORAL_TABLET | ORAL | Status: DC

## 2013-03-07 NOTE — Telephone Encounter (Signed)
Signed.

## 2013-03-07 NOTE — Telephone Encounter (Signed)
Left message on voicemail that rxs are ready for pick up at the front desk and to call if any questions.

## 2013-03-07 NOTE — Telephone Encounter (Signed)
Received message from pt requesting written Rxs for adderall XR and adderall. Last Rxs printed 01/21/13 and pt due for follow up in November. Rxs printed and forwarded to Provider for signature.

## 2013-04-29 ENCOUNTER — Telehealth: Payer: Self-pay | Admitting: *Deleted

## 2013-04-29 MED ORDER — AMPHETAMINE-DEXTROAMPHET ER 20 MG PO CP24: 20.0000 mg | ORAL_CAPSULE | ORAL | Status: DC

## 2013-04-29 MED ORDER — AMPHETAMINE-DEXTROAMPHETAMINE 10 MG PO TABS: ORAL_TABLET | ORAL | Status: DC

## 2013-04-29 NOTE — Telephone Encounter (Signed)
Message left on voicemail and rx placed at front desk for pick up.

## 2013-04-29 NOTE — Telephone Encounter (Signed)
Received message from pt requesting refills on Adderall 10mg  and Adderall xr 20mg . Rx last printed 03/07/13. Pt duet for follow up in November. Rxs printed and forwarded to Provider for signature.

## 2013-04-29 NOTE — Telephone Encounter (Signed)
Signed.

## 2013-06-23 ENCOUNTER — Other Ambulatory Visit: Payer: Self-pay

## 2013-06-27 ENCOUNTER — Telehealth: Payer: Self-pay | Admitting: *Deleted

## 2013-06-27 MED ORDER — AMPHETAMINE-DEXTROAMPHET ER 20 MG PO CP24: 20.0000 mg | ORAL_CAPSULE | ORAL | Status: DC

## 2013-06-27 MED ORDER — AMPHETAMINE-DEXTROAMPHETAMINE 10 MG PO TABS: ORAL_TABLET | ORAL | Status: DC

## 2013-06-27 NOTE — Telephone Encounter (Signed)
Pt left message requesting refill of adderall and adderall xr.  Rxs last printed on 05/29/13. Rxs printed (with direction not to fill before 06/28/13) and forwarded to Provider for signature. Pt is due for 6 month follow up on or after 07/10/13.  Please call pt to arrange appt.

## 2013-06-27 NOTE — Telephone Encounter (Signed)
Rx placed at front desk for pick up and pt is aware.

## 2013-06-27 NOTE — Telephone Encounter (Signed)
Signed.

## 2013-06-27 NOTE — Telephone Encounter (Signed)
Left detailed message informing patient of new prescriptions and that she is due for a 6 month follow up.

## 2013-08-09 ENCOUNTER — Telehealth: Payer: Self-pay | Admitting: Family

## 2013-08-09 NOTE — Telephone Encounter (Signed)
Patient needs to pick up adderall rx on Friday

## 2013-08-10 MED ORDER — AMPHETAMINE-DEXTROAMPHETAMINE 10 MG PO TABS: ORAL_TABLET | ORAL | Status: DC

## 2013-08-10 MED ORDER — AMPHETAMINE-DEXTROAMPHET ER 20 MG PO CP24: 20.0000 mg | ORAL_CAPSULE | ORAL | Status: DC

## 2013-08-10 NOTE — Telephone Encounter (Signed)
Rxs signed and placed at front desk for pick up.  Pt notified.

## 2013-08-10 NOTE — Telephone Encounter (Signed)
Rxs for adderall and adderall XR given to pt on 06/27/13.  Rxs printed and forwarded to Provider for signature.

## 2013-09-21 ENCOUNTER — Encounter: Payer: Self-pay | Admitting: Physician Assistant

## 2013-09-21 ENCOUNTER — Ambulatory Visit (INDEPENDENT_AMBULATORY_CARE_PROVIDER_SITE_OTHER): Payer: 59 | Admitting: Physician Assistant

## 2013-09-21 VITALS — BP 106/64 | HR 81 | Temp 98.4°F | Resp 16 | Ht 66.0 in | Wt 167.0 lb

## 2013-09-21 DIAGNOSIS — J019 Acute sinusitis, unspecified: Secondary | ICD-10-CM | POA: Insufficient documentation

## 2013-09-21 MED ORDER — AMOXICILLIN-POT CLAVULANATE 875-125 MG PO TABS: 1.0000 | ORAL_TABLET | Freq: Two times a day (BID) | ORAL | Status: DC

## 2013-09-21 MED ORDER — FLUTICASONE PROPIONATE 50 MCG/ACT NA SUSP: 2.0000 | Freq: Every day | NASAL | Status: DC

## 2013-09-21 NOTE — Patient Instructions (Signed)
Please increase your fluid intake.  Take your Augmentin as prescribed, with food.  Take a daily probiotic.  Continue your inhalers as directed.  Use Mucinex for congestion.  Please use Flonase as directed.  Continue using your Neti Pot with distilled water.  Please call or return to clinic if symptoms are not improving.  Sinusitis Sinusitis is redness, soreness, and swelling (inflammation) of the paranasal sinuses. Paranasal sinuses are air pockets within the bones of your face (beneath the eyes, the middle of the forehead, or above the eyes). In healthy paranasal sinuses, mucus is able to drain out, and air is able to circulate through them by way of your nose. However, when your paranasal sinuses are inflamed, mucus and air can become trapped. This can allow bacteria and other germs to grow and cause infection. Sinusitis can develop quickly and last only a short time (acute) or continue over a long period (chronic). Sinusitis that lasts for more than 12 weeks is considered chronic.  CAUSES  Causes of sinusitis include:  Allergies.  Structural abnormalities, such as displacement of the cartilage that separates your nostrils (deviated septum), which can decrease the air flow through your nose and sinuses and affect sinus drainage.  Functional abnormalities, such as when the small hairs (cilia) that line your sinuses and help remove mucus do not work properly or are not present. SYMPTOMS  Symptoms of acute and chronic sinusitis are the same. The primary symptoms are pain and pressure around the affected sinuses. Other symptoms include:  Upper toothache.  Earache.  Headache.  Bad breath.  Decreased sense of smell and taste.  A cough, which worsens when you are lying flat.  Fatigue.  Fever.  Thick drainage from your nose, which often is green and may contain pus (purulent).  Swelling and warmth over the affected sinuses. DIAGNOSIS  Your caregiver will perform a physical exam. During  the exam, your caregiver may:  Look in your nose for signs of abnormal growths in your nostrils (nasal polyps).  Tap over the affected sinus to check for signs of infection.  View the inside of your sinuses (endoscopy) with a special imaging device with a light attached (endoscope), which is inserted into your sinuses. If your caregiver suspects that you have chronic sinusitis, one or more of the following tests may be recommended:  Allergy tests.  Nasal culture A sample of mucus is taken from your nose and sent to a lab and screened for bacteria.  Nasal cytology A sample of mucus is taken from your nose and examined by your caregiver to determine if your sinusitis is related to an allergy. TREATMENT  Most cases of acute sinusitis are related to a viral infection and will resolve on their own within 10 days. Sometimes medicines are prescribed to help relieve symptoms (pain medicine, decongestants, nasal steroid sprays, or saline sprays).  However, for sinusitis related to a bacterial infection, your caregiver will prescribe antibiotic medicines. These are medicines that will help kill the bacteria causing the infection.  Rarely, sinusitis is caused by a fungal infection. In theses cases, your caregiver will prescribe antifungal medicine. For some cases of chronic sinusitis, surgery is needed. Generally, these are cases in which sinusitis recurs more than 3 times per year, despite other treatments. HOME CARE INSTRUCTIONS   Drink plenty of water. Water helps thin the mucus so your sinuses can drain more easily.  Use a humidifier.  Inhale steam 3 to 4 times a day (for example, sit in the bathroom  with the shower running).  Apply a warm, moist washcloth to your face 3 to 4 times a day, or as directed by your caregiver.  Use saline nasal sprays to help moisten and clean your sinuses.  Take over-the-counter or prescription medicines for pain, discomfort, or fever only as directed by your  caregiver. SEEK IMMEDIATE MEDICAL CARE IF:  You have increasing pain or severe headaches.  You have nausea, vomiting, or drowsiness.  You have swelling around your face.  You have vision problems.  You have a stiff neck.  You have difficulty breathing. MAKE SURE YOU:   Understand these instructions.  Will watch your condition.  Will get help right away if you are not doing well or get worse. Document Released: 08/04/2005 Document Revised: 10/27/2011 Document Reviewed: 08/19/2011 [facility] Patient Information 2014 [place], [place].

## 2013-09-21 NOTE — Progress Notes (Signed)
Pre visit review using our clinic review tool, if applicable. No additional management support is needed unless otherwise documented below in the visit note/SLS  

## 2013-09-21 NOTE — Assessment & Plan Note (Signed)
Rx Augmentin.  Rx Flonase.  Take antibiotic as prescribed.  Increase fluid intake.  Rest.  Saline nasal spray. Mucinex. Humidifier in bedroom. Probiotic.  Please call or return to clinic if symptoms are not improving.

## 2013-09-21 NOTE — Progress Notes (Signed)
Patient presents to clinic today c/o several days of sinus pressure, sinus pain, ear pain, aches and fatigue.  Patient denies fever, sore throat, cough, SOB or wheezing.  Endorses history of multiple sinus infections.  Patient states she has had several colds over the past two months requiring antibiotic treatment.  Does endorse significant seasonal allergies for which she takes Allegra.  Endorses having a deviated septum.  Endorses seeing ENT in the past who mentioned she would benefit from sinus surgery.  Patient denies recent travel.  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  Past Medical History  Diagnosis Date  . Asthma   . Allergy     allergic rhinitis  . ADD (attention deficit disorder)   . Adjustment disorder     without depression  . Thyroid disease     hyperthyroidism / Graves Disease    Current Outpatient Prescriptions on File Prior to Visit  Medication Sig Dispense Refill  . albuterol (PROAIR HFA) 108 (90 BASE) MCG/ACT inhaler Inhale 2 puffs into the lungs every 6 (six) hours. As needed and before exercise.  3.7 g  2  . amphetamine-dextroamphetamine (ADDERALL XR) 20 MG 24 hr capsule Take 1 capsule (20 mg total) by mouth every morning.  30 capsule  0  . amphetamine-dextroamphetamine (ADDERALL) 10 MG tablet One tablet once daily in the afternoon  30 tablet  0  . fexofenadine (ALLEGRA) 180 MG tablet Take 180 mg by mouth daily as needed.       Marland Kitchen guaiFENesin (MUCINEX) 600 MG 12 hr tablet Take 1 tablet (600 mg total) by mouth 2 (two) times daily.  20 tablet  0  . mometasone-formoterol (DULERA) 200-5 MCG/ACT AERO Inhale 1 puff into the lungs 2 (two) times daily.  1 Inhaler  3  . Norethin Ace-Eth Estrad-FE (MINASTRIN 24 FE) 1-20 MG-MCG(24) CHEW Chew 1 tablet by mouth daily.      . fluticasone (FLONASE) 50 MCG/ACT nasal spray Place 2 sprays into the nose daily.  16 g  3  . [DISCONTINUED] Norethindrone-Ethinyl Estradiol-Fe Biphas (LO LOESTRIN FE) 1  MG-10 MCG / 10 MCG tablet Take 1 tablet by mouth daily.         No current facility-administered medications on file prior to visit.    Allergies  Allergen Reactions  . Escitalopram Oxalate   . Montelukast Sodium     Family History  Problem Relation Age of Onset  . Diabetes Other   . Stroke Other   . Hypertension Other   . Thyroid disease Other   . Migraines Other     History   Social History  . Marital Status: Single    Spouse Name: N/A    Number of Children: 1  . Years of Education: N/A   Occupational History  . RN    Social History Main Topics  . Smoking status: Former Smoker    Types: Cigarettes    Quit date: 07/19/2011  . Smokeless tobacco: Never Used  . Alcohol Use: No  . Drug Use: None  . Sexual Activity: None   Other Topics Concern  . None   Social History Narrative  . None   Review of Systems - See HPI.  All other ROS are negative.  BP 106/64  Pulse 81  Temp(Src) 98.4 F (36.9 C) (Oral)  Resp 16  Ht 5\' 6"  (1.676 m)  Wt 167 lb (75.751 kg)  BMI 26.97 kg/m2  SpO2 98%  LMP 08/31/2013  Physical Exam  Vitals reviewed. Constitutional: She is oriented to person, place, and time and well-developed, well-nourished, and in no distress.  HENT:  Head: Normocephalic and atraumatic.  Right Ear: Tympanic membrane, external ear and ear canal normal.  Left Ear: Tympanic membrane, external ear and ear canal normal.  Nose: Septal deviation present.  Mouth/Throat: Uvula is midline, oropharynx is clear and moist and mucous membranes are normal. No oropharyngeal exudate.  Eyes: Conjunctivae are normal. Pupils are equal, round, and reactive to light.  Neck: Neck supple.  Cardiovascular: Normal rate, regular rhythm, normal heart sounds and intact distal pulses.   Pulmonary/Chest: Effort normal and breath sounds normal. No respiratory distress. She has no wheezes. She has no rales. She exhibits no tenderness.  Lymphadenopathy:    She has no cervical adenopathy.   Neurological: She is alert and oriented to person, place, and time.  Skin: Skin is warm and dry. No rash noted.  Psychiatric: Affect normal.    No results found for this or any previous visit (from the past 2160 hour(s)).  Assessment/Plan: No problem-specific assessment & plan notes found for this encounter.

## 2013-09-22 ENCOUNTER — Ambulatory Visit: Payer: 59 | Admitting: Physician Assistant

## 2013-10-05 ENCOUNTER — Telehealth: Payer: Self-pay | Admitting: Family

## 2013-10-05 NOTE — Telephone Encounter (Signed)
Please advise refill?  Both of the Adderall's were wrote on 08-10-13 quantity 30 with 0 refills

## 2013-10-05 NOTE — Telephone Encounter (Signed)
Patient is requesting a new rx of adderall and adderall xr.

## 2013-10-06 NOTE — Telephone Encounter (Signed)
I will be in office tomorrow and will sign at that time.

## 2013-10-07 MED ORDER — AMPHETAMINE-DEXTROAMPHET ER 20 MG PO CP24: 20.0000 mg | ORAL_CAPSULE | ORAL | Status: DC

## 2013-10-07 MED ORDER — AMPHETAMINE-DEXTROAMPHETAMINE 10 MG PO TABS: ORAL_TABLET | ORAL | Status: DC

## 2013-10-07 NOTE — Addendum Note (Signed)
Addended by: Debbrah Alar on: 10/07/2013 10:18 AM   Modules accepted: Orders

## 2013-10-07 NOTE — Telephone Encounter (Signed)
Rxs completed and sent to front desk for pick up. Left detailed message on pt's voicemail.

## 2013-10-21 ENCOUNTER — Other Ambulatory Visit: Payer: Self-pay | Admitting: Obstetrics and Gynecology

## 2013-10-21 DIAGNOSIS — R928 Other abnormal and inconclusive findings on diagnostic imaging of breast: Secondary | ICD-10-CM

## 2013-11-03 ENCOUNTER — Ambulatory Visit
Admission: RE | Admit: 2013-11-03 | Discharge: 2013-11-03 | Disposition: A | Discharge status: Home / Self Care | Payer: 59 | Source: Ambulatory Visit | Attending: Obstetrics and Gynecology | Admitting: Obstetrics and Gynecology

## 2013-11-03 DIAGNOSIS — R928 Other abnormal and inconclusive findings on diagnostic imaging of breast: Secondary | ICD-10-CM

## 2013-11-23 ENCOUNTER — Telehealth: Payer: Self-pay | Admitting: *Deleted

## 2013-11-23 MED ORDER — AMPHETAMINE-DEXTROAMPHETAMINE 10 MG PO TABS: ORAL_TABLET | ORAL | Status: DC

## 2013-11-23 MED ORDER — AMPHETAMINE-DEXTROAMPHET ER 20 MG PO CP24: 20.0000 mg | ORAL_CAPSULE | ORAL | Status: DC

## 2013-11-23 NOTE — Telephone Encounter (Signed)
Left detailed message on voicemail that rx is at front desk for pick up and to schedule f/u in 1 - 2 months when she picks up rx.

## 2013-11-23 NOTE — Telephone Encounter (Signed)
Please have her arrange OV in next 1-2 months.

## 2013-11-23 NOTE — Telephone Encounter (Signed)
Pt called requesting refills on Adderall XR and Adderall. Rxs last printed on 10/07/13. Rxs printed and forwarded to Provider for signature. Pt last seen in 09/2013 for acute concern and has no future appts on file. When should pt follow up?

## 2013-11-30 ENCOUNTER — Encounter: Payer: Self-pay | Admitting: Family

## 2013-11-30 ENCOUNTER — Ambulatory Visit (INDEPENDENT_AMBULATORY_CARE_PROVIDER_SITE_OTHER): Payer: 59 | Admitting: Family

## 2013-11-30 VITALS — BP 112/80 | HR 52 | Temp 98.0°F | Resp 16 | Ht 66.0 in | Wt 169.1 lb

## 2013-11-30 DIAGNOSIS — J309 Allergic rhinitis, unspecified: Secondary | ICD-10-CM

## 2013-11-30 DIAGNOSIS — J45909 Unspecified asthma, uncomplicated: Secondary | ICD-10-CM

## 2013-11-30 DIAGNOSIS — F988 Other specified behavioral and emotional disorders with onset usually occurring in childhood and adolescence: Secondary | ICD-10-CM

## 2013-11-30 MED ORDER — AMPHETAMINE-DEXTROAMPHET ER 20 MG PO CP24: 20.0000 mg | ORAL_CAPSULE | ORAL | Status: DC

## 2013-11-30 MED ORDER — MONTELUKAST SODIUM 10 MG PO TABS: 10.0000 mg | ORAL_TABLET | Freq: Every day | ORAL | Status: DC

## 2013-11-30 MED ORDER — ALBUTEROL SULFATE HFA 108 (90 BASE) MCG/ACT IN AERS: 2.0000 | INHALATION_SPRAY | Freq: Four times a day (QID) | RESPIRATORY_TRACT | Status: DC

## 2013-11-30 MED ORDER — MOMETASONE FURO-FORMOTEROL FUM 200-5 MCG/ACT IN AERO: 1.0000 | INHALATION_SPRAY | Freq: Two times a day (BID) | RESPIRATORY_TRACT | Status: DC

## 2013-11-30 NOTE — Progress Notes (Signed)
Pre visit review using our clinic review tool, if applicable. No additional management support is needed unless otherwise documented below in the visit note. 

## 2013-11-30 NOTE — Patient Instructions (Signed)
Start Dulera, start singulair, continue albuterol as needed. Call if symptoms worsen or if symptoms do not improve. Follow up in 3 months.

## 2013-11-30 NOTE — Assessment & Plan Note (Signed)
Stable on Adderall.  Continue same, refill provided.

## 2013-11-30 NOTE — Progress Notes (Signed)
Subjective:    Patient ID: [name], female    DOB: 1971/01/31, 43 y.o.   MRN: [id number]  HPI  [name] is a 43 yr old female who presents today for follow up of multiple medical problems:  1) ADD she is maintained on adderall.   2) Weight Gain-  Wt Readings from Last 3 Encounters:  11/30/13 169 lb 1.3 oz (76.694 kg)  09/21/13 167 lb (75.751 kg)  01/07/13 163 lb 1.3 oz (73.973 kg)   3) Shortness of breath- reports "trouble catching my breath" with running. Runs 3 miles at a time. Not currently taking dulera.  Reports constant sinus drainage. On flonase and allegra. Was treated with augmentin in February. Denies CP. Has had some wheezing.  Continues to have post-nasal drip- feels like sinus "infection" cleared up with abx back in February.    Review of Systems See HPI  Past Medical History  Diagnosis Date  . Asthma   . Allergy     allergic rhinitis  . ADD (attention deficit disorder)   . Adjustment disorder     without depression  . Thyroid disease     hyperthyroidism / Graves Disease    History   Social History  . Marital Status: Single    Spouse Name: N/A    Number of Children: 1  . Years of Education: N/A   Occupational History  . RN    Social History Main Topics  . Smoking status: Former Smoker    Types: Cigarettes    Quit date: 07/19/2011  . Smokeless tobacco: Never Used  . Alcohol Use: No  . Drug Use: Not on file  . Sexual Activity: Not on file   Other Topics Concern  . Not on file   Social History Narrative  . No narrative on file    No past surgical history on file.  Family History  Problem Relation Age of Onset  . Diabetes Other   . Stroke Other   . Hypertension Other   . Thyroid disease Other   . Migraines Other     Allergies  Allergen Reactions  . Escitalopram Oxalate   . Montelukast Sodium   . Prednisone     Hot flashes, emotional upset    Current Outpatient Prescriptions on File Prior to Visit  Medication Sig  Dispense Refill  . albuterol (PROAIR HFA) 108 (90 BASE) MCG/ACT inhaler Inhale 2 puffs into the lungs every 6 (six) hours. As needed and before exercise.  3.7 g  2  . amoxicillin-clavulanate (AUGMENTIN) 875-125 MG per tablet Take 1 tablet by mouth 2 (two) times daily.  20 tablet  0  . amphetamine-dextroamphetamine (ADDERALL XR) 20 MG 24 hr capsule Take 1 capsule (20 mg total) by mouth every morning.  30 capsule  0  . amphetamine-dextroamphetamine (ADDERALL) 10 MG tablet One tablet once daily in the afternoon  30 tablet  0  . fexofenadine (ALLEGRA) 180 MG tablet Take 180 mg by mouth daily as needed.       . fluticasone (FLONASE) 50 MCG/ACT nasal spray Place 2 sprays into both nostrils daily.  16 g  6  . guaiFENesin (MUCINEX) 600 MG 12 hr tablet Take 1 tablet (600 mg total) by mouth 2 (two) times daily.  20 tablet  0  . mometasone-formoterol (DULERA) 200-5 MCG/ACT AERO Inhale 1 puff into the lungs 2 (two) times daily.  1 Inhaler  3  . Multiple Vitamin (MULTIVITAMIN) tablet Take 1 tablet by mouth daily.      Marland Kitchen  Norethin Ace-Eth Estrad-FE (MINASTRIN 24 FE) 1-20 MG-MCG(24) CHEW Chew 1 tablet by mouth daily.      . fluticasone (FLONASE) 50 MCG/ACT nasal spray Place 2 sprays into the nose daily.  16 g  3  . [DISCONTINUED] Norethindrone-Ethinyl Estradiol-Fe Biphas (LO LOESTRIN FE) 1 MG-10 MCG / 10 MCG tablet Take 1 tablet by mouth daily.         No current facility-administered medications on file prior to visit.    BP 112/80  Pulse 52  Temp(Src) 98 F (36.7 C) (Oral)  Resp 16  Ht 5\' 6"  (1.676 m)  Wt 169 lb 1.3 oz (76.694 kg)  BMI 27.30 kg/m2  SpO2 99%  LMP 10/19/2013       Objective:   Physical Exam  Constitutional: She is oriented to person, place, and time. She appears well-developed and well-nourished. No distress.  HENT:  Head: Normocephalic and atraumatic.  Cardiovascular: Normal rate and regular rhythm.   No murmur heard. Pulmonary/Chest: Effort normal and breath sounds normal. No  respiratory distress. She has no wheezes. She has no rales. She exhibits no tenderness.  Lymphadenopathy:    She has no cervical adenopathy.  Neurological: She is alert and oriented to person, place, and time.  Psychiatric: She has a normal mood and affect. Her behavior is normal. Judgment and thought content normal.          Assessment & Plan:

## 2013-11-30 NOTE — Assessment & Plan Note (Signed)
Deteriorated. Add singulair, continue flonase and allegra.

## 2013-11-30 NOTE — Assessment & Plan Note (Signed)
Deteriorated, will resume dulera, add singulair, continue advair.

## 2014-02-23 ENCOUNTER — Telehealth: Payer: Self-pay | Admitting: Family

## 2014-02-23 NOTE — Telephone Encounter (Signed)
Please advise refill?   Last OV was 11-30-13   Last RX for 20 mg was 11-30-13 quantity 30 with 0 refills  Last RX for 10 mg was 11-23-13 quantity 30 with 0 refills

## 2014-02-23 NOTE — Telephone Encounter (Signed)
Patient left message requesting refill on Adderall XR 20 mg and Adderall 10 mg, call when ready for pick up

## 2014-02-24 MED ORDER — AMPHETAMINE-DEXTROAMPHETAMINE 10 MG PO TABS: ORAL_TABLET | ORAL | Status: DC

## 2014-02-24 MED ORDER — AMPHETAMINE-DEXTROAMPHET ER 20 MG PO CP24: 20.0000 mg | ORAL_CAPSULE | ORAL | Status: DC

## 2014-02-24 NOTE — Telephone Encounter (Signed)
See rxs

## 2014-02-24 NOTE — Telephone Encounter (Signed)
Rxs sent to front desk, left detailed message on pt's cell#.

## 2014-02-24 NOTE — Telephone Encounter (Signed)
Rxs forwarded to Provider for signature.

## 2014-02-27 ENCOUNTER — Encounter: Payer: Self-pay | Admitting: Sports Medicine

## 2014-02-27 ENCOUNTER — Ambulatory Visit (INDEPENDENT_AMBULATORY_CARE_PROVIDER_SITE_OTHER): Payer: 59 | Admitting: Sports Medicine

## 2014-02-27 VITALS — BP 149/90 | HR 77 | Ht 66.0 in | Wt 168.0 lb

## 2014-02-27 DIAGNOSIS — S86311A Strain of muscle(s) and tendon(s) of peroneal muscle group at lower leg level, right leg, initial encounter: Secondary | ICD-10-CM

## 2014-02-27 DIAGNOSIS — M79671 Pain in right foot: Secondary | ICD-10-CM

## 2014-02-27 DIAGNOSIS — M79609 Pain in unspecified limb: Secondary | ICD-10-CM

## 2014-02-27 DIAGNOSIS — S93699A Other sprain of unspecified foot, initial encounter: Secondary | ICD-10-CM

## 2014-02-27 NOTE — Progress Notes (Signed)
Patient ID: [name], female   DOB: 08-09-1971, 43 y.o.   MRN: [id number]  [name] - 43 y.o. female MRN [id number]  Date of birth: Dec 14, 1970    SUBJECTIVE:     43 year old female runner presents for evaluation of right ankle pain.  Patient reports she has had right heel pain as well as lateral ankle pain for approximately 7-8 months.  She reports that this has recently worsened. She is an avid runner and has been running 12-15 miles/week.  She has continued to run despite the pain.  No relieving factors, although she has tried several different shoes and has tried an insert from [employer].  Pain is exacerbated by activity and being on her feet for long periods of time.  No other complaints at this time.   Of note, this Saturday she ran and suffered an inversion injury.   ROS:     Per HPI.  PERTINENT  PMH / PSH FH / / SH:  Past Medical, Surgical, Social, and Family History Reviewed & Updated per EMR.  Pertinent Historical Findings include: Prior surgery on right ankle.   OBJECTIVE: BP 149/90  Pulse 77  Ht 5\' 6"  (1.676 m)  Wt 168 lb (76.204 kg)  BMI 27.13 kg/m2  Physical Exam:  Vital signs are reviewed. General: well appearing female in NAD.  Ankle/Foot: No visible erythema. Mild swelling noted laterally around the lateral epicondyle. Range of motion is full in all directions. Strength is 5/5 in all directions. Stable lateral and medial ligaments. Underlying hardware from prior surgery noted medially on palpation. Tender to palpation at the third metatarsal head on the right. There is mild callus build up here as well. Pain illicited with resisted flexion and eversion of the foot. No subluxation noted.   Pronation noted with running.  ASSESSMENT & PLAN: See problem based charting & AVS for pt instructions.

## 2014-02-27 NOTE — Assessment & Plan Note (Addendum)
Patient given temporary inserts for arch support to address pronation when running, which appears to be the underlying factor in strain. Also given metatarsal pads for her metatarsalgia Body helix compression sleeve for ankle support Patient encouraged to wear her comfortable running shoes.  Follow up in 4 weeks to reassess. Consider ultrasound evaluation of the peroneal tendons if symptoms persist.

## 2014-03-28 ENCOUNTER — Ambulatory Visit (INDEPENDENT_AMBULATORY_CARE_PROVIDER_SITE_OTHER): Payer: Commercial Managed Care - PPO | Admitting: Surgery

## 2014-03-28 ENCOUNTER — Encounter (INDEPENDENT_AMBULATORY_CARE_PROVIDER_SITE_OTHER): Payer: Self-pay | Admitting: Surgery

## 2014-03-28 ENCOUNTER — Other Ambulatory Visit (INDEPENDENT_AMBULATORY_CARE_PROVIDER_SITE_OTHER): Payer: Self-pay

## 2014-03-28 VITALS — BP 116/76 | HR 84 | Temp 97.9°F | Ht 66.0 in | Wt 170.0 lb

## 2014-03-28 DIAGNOSIS — R928 Other abnormal and inconclusive findings on diagnostic imaging of breast: Secondary | ICD-10-CM

## 2014-03-28 NOTE — Progress Notes (Signed)
Patient ID: [name], female   DOB: [birth year], 43 y.o.   MRN: [id number]  Chief Complaint  Patient presents with  . eval Lt Breast    HPI [name] is a 43 y.o. female.  Referred by Dr. Helane Rima for evaluation of left breast tenderness/ asymmetry  HPI This is a healthy 43 year old female who recently had her first routine screening mammogram. This showed some asymmetry in the lateral part of her left breast. In March of this year she underwent diagnostic mammogram and ultrasound which showed only fibroglandular tissue with no visible masses. The patient reports that she gets occasional twinges of pain in the lateral part of her left breast near her chest wall. These twinges of pain are episodic and not constant. The patient has a retropectoral saline implants that were placed by Dr. Luvenia Heller in 2004. Radiology had recommended 6 month followup diagnostic mammogram on the left. The patient denies any palpable masses on self-examination.  No nipple discharge.  Past Medical History  Diagnosis Date  . Asthma   . Allergy     allergic rhinitis  . ADD (attention deficit disorder)   . Adjustment disorder     without depression  . Thyroid disease     hyperthyroidism / Graves Disease    PSH - Right ankle reconstruction Bilateral breast augmentation - Dr. Luvenia Heller - retropectoral saline 2004  Family History  Problem Relation Age of Onset  . Diabetes Other   . Stroke Other   . Hypertension Other   . Thyroid disease Other   . Migraines Other   Maternal aunt - metastatic breast cancer Maternal grandmother - pancreatic cancer  Social History History  Substance Use Topics  . Smoking status: Former Smoker    Types: Cigarettes    Quit date: 07/19/2011  . Smokeless tobacco: Never Used  . Alcohol Use: Yes    Allergies  Allergen Reactions  . Escitalopram Oxalate   . Montelukast Sodium   . Prednisone     Hot flashes, emotional upset    Current Outpatient Prescriptions  Medication  Sig Dispense Refill  . albuterol (PROAIR HFA) 108 (90 BASE) MCG/ACT inhaler Inhale 2 puffs into the lungs every 6 (six) hours. As needed and before exercise.  3.7 g  2  . amoxicillin-clavulanate (AUGMENTIN) 875-125 MG per tablet Take 1 tablet by mouth 2 (two) times daily.  20 tablet  0  . amphetamine-dextroamphetamine (ADDERALL XR) 20 MG 24 hr capsule Take 1 capsule (20 mg total) by mouth every morning.  30 capsule  0  . amphetamine-dextroamphetamine (ADDERALL) 10 MG tablet One tablet once daily in the afternoon  30 tablet  0  . fexofenadine (ALLEGRA) 180 MG tablet Take 180 mg by mouth daily as needed.       . fluticasone (FLONASE) 50 MCG/ACT nasal spray Place 2 sprays into both nostrils daily.  16 g  6  . mometasone-formoterol (DULERA) 200-5 MCG/ACT AERO Inhale 1 puff into the lungs 2 (two) times daily.  1 Inhaler  3  . montelukast (SINGULAIR) 10 MG tablet Take 1 tablet (10 mg total) by mouth at bedtime.  30 tablet  3  . Multiple Vitamin (MULTIVITAMIN) tablet Take 1 tablet by mouth daily.      . Norethin Ace-Eth Estrad-FE (MINASTRIN 24 FE) 1-20 MG-MCG(24) CHEW Chew 1 tablet by mouth daily.      . fluticasone (FLONASE) 50 MCG/ACT nasal spray Place 2 sprays into the nose daily.  16 g  3  . [DISCONTINUED] Norethindrone-Ethinyl  Estradiol-Fe Biphas (LO LOESTRIN FE) 1 MG-10 MCG / 10 MCG tablet Take 1 tablet by mouth daily.         No current facility-administered medications for this visit.    Review of Systems Review of Systems  Constitutional: Negative for fever, chills and unexpected weight change.  HENT: Negative for congestion, hearing loss, sore throat, trouble swallowing and voice change.   Eyes: Negative for visual disturbance.  Respiratory: Negative for cough and wheezing.   Cardiovascular: Negative for chest pain, palpitations and leg swelling.  Gastrointestinal: Negative for nausea, vomiting, abdominal pain, diarrhea, constipation, blood in stool, abdominal distention and anal bleeding.   Genitourinary: Negative for hematuria, vaginal bleeding and difficulty urinating.  Musculoskeletal: Negative for arthralgias.  Skin: Negative for rash and wound.  Neurological: Negative for seizures, syncope and headaches.  Hematological: Negative for adenopathy. Does not bruise/bleed easily.  Psychiatric/Behavioral: Negative for confusion.   Menarche age 33 First pregnancy age 46 Breast feed - none OCP - 24 years FH - maternal aunt  Blood pressure 116/76, pulse 84, temperature 97.9 F (36.6 C), height 5\' 6"  (1.676 m), weight 170 lb (77.111 kg).  Physical Exam Physical Exam WDWN in NAD HEENT:  EOMI, sclera anicteric Neck:  No masses, no thyromegaly Lungs:  CTA bilaterally; normal respiratory effort Breasts:  Healed augmentation incisions; on both sides, the medial part of the breasts seem slightly ptotic compared to the lateral breasts; bilateral fibrocystic changes; no dominant masses;  CV:  Regular rate and rhythm; no murmurs Abd:  +bowel sounds, soft, non-tender, no masses Ext:  Well-perfused; no edema Skin:  Warm, dry; no sign of jaundice  Data Reviewed Mammogram/ U/S from 3/15  Assessment    No palpable findings.  Possible capsular contracture causing occasional left lateral breast pain.       Plan    Will await findings of repeat diagnostic mammogram in September.  If no significant findings, would continue with annual mammograms.          Koral Thaden K. 03/28/2014, 11:40 AM

## 2014-03-29 ENCOUNTER — Ambulatory Visit (INDEPENDENT_AMBULATORY_CARE_PROVIDER_SITE_OTHER): Payer: 59 | Admitting: Sports Medicine

## 2014-03-29 ENCOUNTER — Encounter: Payer: Self-pay | Admitting: Sports Medicine

## 2014-03-29 VITALS — BP 132/97 | Ht 66.0 in | Wt 170.0 lb

## 2014-03-29 DIAGNOSIS — S86311D Strain of muscle(s) and tendon(s) of peroneal muscle group at lower leg level, right leg, subsequent encounter: Secondary | ICD-10-CM

## 2014-03-29 DIAGNOSIS — S93699A Other sprain of unspecified foot, initial encounter: Secondary | ICD-10-CM

## 2014-03-29 DIAGNOSIS — Z5189 Encounter for other specified aftercare: Secondary | ICD-10-CM

## 2014-03-30 NOTE — Progress Notes (Signed)
   Subjective:    Patient ID: [name], female    DOB: [birth year], 43 y.o.   MRN: [id number]  HPI Patient comes in today for followup on right foot and ankle pain. Overall, she is doing better. She had to readjust her metatarsal pads but otherwise she has found her green sports insoles to be quite comfortable. She has also found great benefit from her body helix ankle compression sleeve. She has only done a little bit of running. She is getting ready to buy new running shoes. She also notes that the right ankle wants to give way when she first starts running but then does not recur. This has been going on for many years.    Review of Systems     Objective:   Physical Exam Well-developed, well-nourished. No acute distress  Right ankle/foot: Minimal tenderness along the course of the peroneal tendon. 4 range of motion. Good stability. Pronation is well-controlled with her green sports insoles and scaphoid pads.       Assessment & Plan:  Improving peroneal tendon strain  Patient will continue with her green sports insoles for the time being. She would benefit from custom orthotics at some point. I've given her a comprehensive ankle strengthening program. She will continue with her body helix sleeve as well. She can continue to increase activity as tolerated and followup with me when necessary.

## 2014-04-26 ENCOUNTER — Telehealth: Payer: Self-pay | Admitting: Family

## 2014-04-26 MED ORDER — AMPHETAMINE-DEXTROAMPHET ER 20 MG PO CP24: 20.0000 mg | ORAL_CAPSULE | ORAL | Status: DC

## 2014-04-26 MED ORDER — AMPHETAMINE-DEXTROAMPHETAMINE 10 MG PO TABS: ORAL_TABLET | ORAL | Status: DC

## 2014-04-26 NOTE — Telephone Encounter (Signed)
Both Rxs last printed 02/24/14.  Rxs printed and forwarded to Provider for signature.

## 2014-04-26 NOTE — Telephone Encounter (Signed)
Rx signed and sent to front desk for pick up. Left detailed message for pt and to call if any questions.

## 2014-04-26 NOTE — Telephone Encounter (Signed)
Caller name: Jocie  Call back [place]:  Reason for call: pt is needing these Rx's refilled:  amphetamine-dextroamphetamine (ADDERALL XR) 20 MG 24 hr  amphetamine-dextroamphetamine (ADDERALL) 10 MG tablet   Call when complete

## 2014-05-02 ENCOUNTER — Ambulatory Visit (INDEPENDENT_AMBULATORY_CARE_PROVIDER_SITE_OTHER): Payer: 59 | Admitting: Physician Assistant

## 2014-05-02 ENCOUNTER — Encounter: Payer: Self-pay | Admitting: Physician Assistant

## 2014-05-02 ENCOUNTER — Ambulatory Visit (HOSPITAL_BASED_OUTPATIENT_CLINIC_OR_DEPARTMENT_OTHER)
Admission: RE | Admit: 2014-05-02 | Discharge: 2014-05-02 | Disposition: A | Discharge status: Home / Self Care | Payer: 59 | Source: Ambulatory Visit | Attending: Physician Assistant | Admitting: Physician Assistant

## 2014-05-02 VITALS — BP 116/73 | HR 62 | Temp 98.4°F | Resp 16 | Ht 66.0 in | Wt 173.1 lb

## 2014-05-02 DIAGNOSIS — R29890 Loss of height: Secondary | ICD-10-CM | POA: Insufficient documentation

## 2014-05-02 DIAGNOSIS — R5381 Other malaise: Secondary | ICD-10-CM | POA: Diagnosis not present

## 2014-05-02 DIAGNOSIS — M542 Cervicalgia: Secondary | ICD-10-CM

## 2014-05-02 DIAGNOSIS — R5383 Other fatigue: Secondary | ICD-10-CM | POA: Diagnosis present

## 2014-05-02 DIAGNOSIS — M62838 Other muscle spasm: Secondary | ICD-10-CM

## 2014-05-02 MED ORDER — TRAMADOL HCL 50 MG PO TABS: ORAL_TABLET | ORAL | Status: DC

## 2014-05-02 MED ORDER — METHYLPREDNISOLONE (PAK) 4 MG PO TABS: ORAL_TABLET | ORAL | Status: DC

## 2014-05-02 MED ORDER — CYCLOBENZAPRINE HCL 10 MG PO TABS: 10.0000 mg | ORAL_TABLET | Freq: Three times a day (TID) | ORAL | Status: DC | PRN

## 2014-05-02 NOTE — Patient Instructions (Signed)
Please take medications as directed.  Avoid heavy lifting or overexertion.  Use your heating pad but only in 15-minute intervals.  Prolonged use can worsen muscle spasms.   Don't forget to go to the imaging department for x-ray.  If symptoms persist and x-ray unremarkable, we will have to proceed with MRI.

## 2014-05-02 NOTE — Progress Notes (Signed)
Pre visit review using our clinic review tool, if applicable. No additional management support is needed unless otherwise documented below in the visit note/SLS  

## 2014-05-03 DIAGNOSIS — M542 Cervicalgia: Secondary | ICD-10-CM | POA: Insufficient documentation

## 2014-05-03 NOTE — Assessment & Plan Note (Signed)
Will obtain X-ray of cervical spine.  Rx medrol dose pack giving nerve inflammation symptoms.  Continue Tramadol as directed.  Flexeril for spasm.  Topical Aspercreme. Avoid lying on heating pad all night as it will cause spasm.  Follow-up if no improvement over the next week.

## 2014-05-03 NOTE — Progress Notes (Signed)
Patient presents to clinic today c/o neck pain and spasms that have been present intermittently over the past couple of months.  States this has worsened over the past couple of days after seeing her chiropractor for an adjustment.  Denies trauma or injury.  Is involved with heavy lifting at work.  Denies numbness or tingling of upper extremities bilaterally.  Patient does endorses mild weakness of shoulders bilaterally over the past day.  Patient states she has been sleeping on a heating pad each night.  Past Medical History  Diagnosis Date  . Asthma   . Allergy     allergic rhinitis  . ADD (attention deficit disorder)   . Adjustment disorder     without depression  . Thyroid disease     hyperthyroidism / Graves Disease    Current Outpatient Prescriptions on File Prior to Visit  Medication Sig Dispense Refill  . albuterol (PROAIR HFA) 108 (90 BASE) MCG/ACT inhaler Inhale 2 puffs into the lungs every 6 (six) hours. As needed and before exercise.  3.7 g  2  . amphetamine-dextroamphetamine (ADDERALL XR) 20 MG 24 hr capsule Take 1 capsule (20 mg total) by mouth every morning.  30 capsule  0  . amphetamine-dextroamphetamine (ADDERALL) 10 MG tablet One tablet once daily in the afternoon  30 tablet  0  . fexofenadine (ALLEGRA) 180 MG tablet Take 180 mg by mouth daily as needed.       . fluticasone (FLONASE) 50 MCG/ACT nasal spray Place 2 sprays into both nostrils daily.  16 g  6  . mometasone-formoterol (DULERA) 200-5 MCG/ACT AERO Inhale 1 puff into the lungs 2 (two) times daily.  1 Inhaler  3  . Multiple Vitamin (MULTIVITAMIN) tablet Take 1 tablet by mouth daily.      . Norethin Ace-Eth Estrad-FE (MINASTRIN 24 FE) 1-20 MG-MCG(24) CHEW Chew 1 tablet by mouth daily.      . [DISCONTINUED] Norethindrone-Ethinyl Estradiol-Fe Biphas (LO LOESTRIN FE) 1 MG-10 MCG / 10 MCG tablet Take 1 tablet by mouth daily.         No current facility-administered medications on file prior to visit.     Allergies  Allergen Reactions  . Escitalopram Oxalate   . Montelukast Sodium   . Prednisone     Hot flashes, emotional upset    Family History  Problem Relation Age of Onset  . Diabetes Other   . Stroke Other   . Hypertension Other   . Thyroid disease Other   . Migraines Other     History   Social History  . Marital Status: Single    Spouse Name: N/A    Number of Children: 1  . Years of Education: N/A   Occupational History  . RN    Social History Main Topics  . Smoking status: Former Smoker    Types: Cigarettes    Quit date: 07/19/2011  . Smokeless tobacco: Never Used  . Alcohol Use: Yes  . Drug Use: No  . Sexual Activity: None   Other Topics Concern  . None   Social History Narrative  . None   Review of Systems - See HPI.  All other ROS are negative.  BP 116/73  Pulse 62  Temp(Src) 98.4 F (36.9 C) (Oral)  Resp 16  Ht 5\' 6"  (1.676 m)  Wt 173 lb 2 oz (78.529 kg)  BMI 27.96 kg/m2  SpO2 100%  Physical Exam  Vitals reviewed. Constitutional: She is oriented to person, place, and time and well-developed,  well-nourished, and in no distress.  HENT:  Head: Normocephalic and atraumatic.  Eyes: Conjunctivae are normal.  Neck: Neck supple. Muscular tenderness present. No spinous process tenderness present. Normal range of motion present. No Brudzinski's sign and no Kernig's sign noted. No thyromegaly present.  Cardiovascular: Normal rate, regular rhythm, normal heart sounds and intact distal pulses.   Pulmonary/Chest: Effort normal and breath sounds normal. No respiratory distress. She has no wheezes. She has no rales. She exhibits no tenderness.  Musculoskeletal:       Cervical back: She exhibits tenderness, pain and spasm. She exhibits normal range of motion, no bony tenderness, no swelling and no deformity.  Neurological: She is alert and oriented to person, place, and time. She has normal reflexes.  Skin: Skin is warm and dry. No rash noted.   Psychiatric: Affect normal.   Assessment/Plan: Cervical pain (neck) Will obtain X-ray of cervical spine.  Rx medrol dose pack giving nerve inflammation symptoms.  Continue Tramadol as directed.  Flexeril for spasm.  Topical Aspercreme. Avoid lying on heating pad all night as it will cause spasm.  Follow-up if no improvement over the next week.

## 2014-05-04 ENCOUNTER — Telehealth: Payer: Self-pay | Admitting: Physician Assistant

## 2014-05-04 NOTE — Telephone Encounter (Signed)
Patient states it can wait until tomorrow

## 2014-05-04 NOTE — Telephone Encounter (Signed)
I am not in office today.  Nor is my nurse.  I will write tomorrow when I am back in office.  If she needs to pick up today, then you need to forward this to another staff member to write the letter.  I give my approval for patient to return to work without restrictions.

## 2014-05-04 NOTE — Telephone Encounter (Signed)
Caller name: Tashanna  Relation to pt: self  Call back number: [phone]   Reason for call:   Pt requesting a return back to work note for  05/02/2014 Acute visit. Please mention in note pt can return to work without restriction.   Patient will call back with fax number

## 2014-05-05 ENCOUNTER — Encounter: Payer: Self-pay | Admitting: Physician Assistant

## 2014-05-05 NOTE — Telephone Encounter (Signed)
Patient called back with fax number where letter should be [phone]

## 2014-05-05 NOTE — Telephone Encounter (Signed)
Please let patient know that her note has been faxed to the number she specified.

## 2014-05-08 ENCOUNTER — Ambulatory Visit
Admission: RE | Admit: 2014-05-08 | Discharge: 2014-05-08 | Disposition: A | Discharge status: Home / Self Care | Payer: 59 | Source: Ambulatory Visit | Attending: Surgery | Admitting: Surgery

## 2014-05-08 ENCOUNTER — Other Ambulatory Visit (INDEPENDENT_AMBULATORY_CARE_PROVIDER_SITE_OTHER): Payer: Self-pay | Admitting: Surgery

## 2014-05-08 DIAGNOSIS — R928 Other abnormal and inconclusive findings on diagnostic imaging of breast: Secondary | ICD-10-CM

## 2014-05-08 NOTE — Progress Notes (Signed)
Quick Note:  Please call the patient and let them know that their mammogram looked better than the last mammogram. No suspicious masses noted. ______

## 2014-06-14 ENCOUNTER — Ambulatory Visit (INDEPENDENT_AMBULATORY_CARE_PROVIDER_SITE_OTHER): Payer: 59 | Admitting: Family

## 2014-06-14 ENCOUNTER — Encounter: Payer: Self-pay | Admitting: Family

## 2014-06-14 VITALS — BP 120/80 | HR 62 | Temp 98.0°F | Resp 16 | Ht 66.0 in | Wt 176.0 lb

## 2014-06-14 DIAGNOSIS — N912 Amenorrhea, unspecified: Secondary | ICD-10-CM

## 2014-06-14 DIAGNOSIS — L659 Nonscarring hair loss, unspecified: Secondary | ICD-10-CM | POA: Insufficient documentation

## 2014-06-14 DIAGNOSIS — R609 Edema, unspecified: Secondary | ICD-10-CM | POA: Insufficient documentation

## 2014-06-14 DIAGNOSIS — F988 Other specified behavioral and emotional disorders with onset usually occurring in childhood and adolescence: Secondary | ICD-10-CM

## 2014-06-14 DIAGNOSIS — F909 Attention-deficit hyperactivity disorder, unspecified type: Secondary | ICD-10-CM

## 2014-06-14 DIAGNOSIS — N926 Irregular menstruation, unspecified: Secondary | ICD-10-CM

## 2014-06-14 LAB — CBC WITH DIFFERENTIAL/PLATELET
BASOS ABS: 0 10*3/uL (ref 0.0–0.1)
Basophils Relative: 0.7 % (ref 0.0–3.0)
EOS ABS: 0.1 10*3/uL (ref 0.0–0.7)
EOS PCT: 2.5 % (ref 0.0–5.0)
HCT: 36.4 % (ref 36.0–46.0)
HEMOGLOBIN: 12.5 g/dL (ref 12.0–15.0)
LYMPHS ABS: 1.7 10*3/uL (ref 0.7–4.0)
Lymphocytes Relative: 42.4 % (ref 12.0–46.0)
MCHC: 34.2 g/dL (ref 30.0–36.0)
MCV: 91.3 fl (ref 78.0–100.0)
Monocytes Absolute: 0.4 10*3/uL (ref 0.1–1.0)
Monocytes Relative: 9 % (ref 3.0–12.0)
Neutro Abs: 1.8 10*3/uL (ref 1.4–7.7)
Neutrophils Relative %: 45.4 % (ref 43.0–77.0)
Platelets: 212 10*3/uL (ref 150.0–400.0)
RBC: 3.99 Mil/uL (ref 3.87–5.11)
RDW: 12.7 % (ref 11.5–15.5)
WBC: 4 10*3/uL (ref 4.0–10.5)

## 2014-06-14 LAB — T4, FREE: FREE T4: 0.86 ng/dL (ref 0.60–1.60)

## 2014-06-14 LAB — T3, FREE: T3, Free: 2.7 pg/mL (ref 2.3–4.2)

## 2014-06-14 LAB — POCT URINE PREGNANCY: Preg Test, Ur: NEGATIVE

## 2014-06-14 LAB — TSH: TSH: 3.15 u[IU]/mL (ref 0.35–4.50)

## 2014-06-14 MED ORDER — ALBUTEROL SULFATE HFA 108 (90 BASE) MCG/ACT IN AERS: 2.0000 | INHALATION_SPRAY | Freq: Four times a day (QID) | RESPIRATORY_TRACT | Status: DC

## 2014-06-14 NOTE — Assessment & Plan Note (Signed)
Obtain CBC to rule out anemia, obtain TFT's to further evaluate thyroid function.

## 2014-06-14 NOTE — Assessment & Plan Note (Signed)
Likely secondary to prolonged standing.  Monitor.

## 2014-06-14 NOTE — Patient Instructions (Signed)
Please complete lab work prior to leaving. Follow up in 1 month.  

## 2014-06-14 NOTE — Progress Notes (Signed)
Subjective:    Patient ID: [name], female    DOB: 03-27-1971, 43 y.o.   MRN: [id number]  HPI  Ms. Plass is a 43 year old female who presents today with chief complaint of alopecia and wants to follow up on her ADD.  1. Hair loss - Started about 3 months ago.  When she washes her hair, she notices that there is a lot more hair breaking off in the shower.  She feels like her hair breaks off more frequently, and it will not grow out.  Reports hair is more thin and frail. Of note, she runs 20 miles a week and feels like she cannot lose weight.    2. ADD- Currently takes Adderall XR and adderall immediate release as needed.  She notices that she is not completing tasks.  She feels like she is thinking about too many different things at one time.  She is in school to obtain her BSN and is becoming behind on her assignments.  She also reports that she is not focused at work and is not sure which task she needs to complete next.   3. Leg swelling - Started about three months ago.  She works as a Marine scientist and noticied that her legs are swollen about 4 hours into the shift.  They feel itchy and like her socks are cutting into her legs.  Edema is non pitting.   4. Menstral cycle issues - Reports that her last cycle was 12/29/13.  Sees Dr. Helane Rima who gave her oral contraceptives three years ago and she has taken them regularly. She reports hot flashes and night sweats.  She is not currently sexually active.  Last week she had intermittent episodes of spotting.  She is due to follow up with Dr. Helane Rima in March of 2016.    Review of Systems  Constitutional: Negative for chills, diaphoresis and fatigue.  HENT: Negative.   Respiratory: Negative for cough, chest tightness and shortness of breath.   Cardiovascular: Positive for leg swelling. Negative for chest pain and palpitations.  Genitourinary: Negative for vaginal bleeding, vaginal discharge and vaginal pain.  Skin: Negative for pallor, rash and  wound.  Psychiatric/Behavioral: Positive for decreased concentration. Negative for sleep disturbance. The patient is not nervous/anxious.    Past Medical History  Diagnosis Date  . Asthma   . Allergy     allergic rhinitis  . ADD (attention deficit disorder)   . Adjustment disorder     without depression  . Thyroid disease     hyperthyroidism / Graves Disease    History   Social History  . Marital Status: Single    Spouse Name: N/A    Number of Children: 1  . Years of Education: N/A   Occupational History  . RN    Social History Main Topics  . Smoking status: Former Smoker    Types: Cigarettes    Quit date: 07/19/2011  . Smokeless tobacco: Never Used  . Alcohol Use: Yes  . Drug Use: No  . Sexual Activity: Not on file   Other Topics Concern  . Not on file   Social History Narrative  . No narrative on file    No past surgical history on file.  Family History  Problem Relation Age of Onset  . Diabetes Other   . Stroke Other   . Hypertension Other   . Thyroid disease Other   . Migraines Other     Allergies  Allergen Reactions  .  Escitalopram Oxalate   . Montelukast Sodium   . Prednisone     Hot flashes, emotional upset    Current Outpatient Prescriptions on File Prior to Visit  Medication Sig Dispense Refill  . amphetamine-dextroamphetamine (ADDERALL XR) 20 MG 24 hr capsule Take 1 capsule (20 mg total) by mouth every morning.  30 capsule  0  . amphetamine-dextroamphetamine (ADDERALL) 10 MG tablet One tablet once daily in the afternoon  30 tablet  0  . cyclobenzaprine (FLEXERIL) 10 MG tablet Take 1 tablet (10 mg total) by mouth 3 (three) times daily as needed for muscle spasms.  60 tablet  0  . fexofenadine (ALLEGRA) 180 MG tablet Take 180 mg by mouth daily as needed.       . fluticasone (FLONASE) 50 MCG/ACT nasal spray Place 2 sprays into both nostrils daily.  16 g  6  . mometasone-formoterol (DULERA) 200-5 MCG/ACT AERO Inhale 1 puff into the lungs 2  (two) times daily.  1 Inhaler  3  . Multiple Vitamin (MULTIVITAMIN) tablet Take 1 tablet by mouth daily.      . Norethin Ace-Eth Estrad-FE (MINASTRIN 24 FE) 1-20 MG-MCG(24) CHEW Chew 1 tablet by mouth daily.      . [DISCONTINUED] Norethindrone-Ethinyl Estradiol-Fe Biphas (LO LOESTRIN FE) 1 MG-10 MCG / 10 MCG tablet Take 1 tablet by mouth daily.         No current facility-administered medications on file prior to visit.    BP 120/80  Pulse 62  Temp(Src) 98 F (36.7 C) (Oral)  Resp 16  Ht 5\' 6"  (1.676 m)  Wt 176 lb (79.833 kg)  BMI 28.42 kg/m2  SpO2 98%       Objective:   Physical Exam  Constitutional: She is oriented to person, place, and time. She appears well-developed and well-nourished.  HENT:  Head: Normocephalic and atraumatic.  Right Ear: External ear normal.  Left Ear: External ear normal.  Mouth/Throat: Oropharynx is clear and moist.  Eyes: Pupils are equal, round, and reactive to light.  Neck: Normal range of motion. Neck supple. Thyromegaly present.  Cardiovascular: Normal rate, regular rhythm, normal heart sounds and intact distal pulses.   No murmur heard. Pulmonary/Chest: Effort normal and breath sounds normal.  Musculoskeletal: Normal range of motion.  Neurological: She is alert and oriented to person, place, and time.  Skin: Skin is warm and dry.          Assessment & Plan:  Will check TSH and CBC.  Will not increase Adderall until TSH results.  Will follow up as needed.    I have personally seen and examined patient and agree with Jettie Booze NP student's assessment and plan.

## 2014-06-14 NOTE — Assessment & Plan Note (Signed)
Obtain urine hcg, keep upcoming apt with Dr. Monica Becton, continue OCP.

## 2014-06-14 NOTE — Assessment & Plan Note (Signed)
Need to see if thyroid function is stable as hyperthyroid could be contributing to concentration issues. If TFT's normal, consider changing to focalin.

## 2014-06-14 NOTE — Progress Notes (Signed)
Pre visit review using our clinic review tool, if applicable. No additional management support is needed unless otherwise documented below in the visit note. 

## 2014-06-16 ENCOUNTER — Encounter: Payer: Self-pay | Admitting: Family

## 2014-06-19 ENCOUNTER — Telehealth: Payer: Self-pay | Admitting: Family

## 2014-06-19 NOTE — Telephone Encounter (Signed)
Left detailed message on pt's cell# and to call and let us know how she wants to proceed?   I reviewed your thyroid function tests and they are all normal. Blood count is normal. So, I don't think thyroid is contributing to your concentration issues. If you would like, we could try focalin instead for your ADD. Let me know.    Melissa

## 2014-06-19 NOTE — Telephone Encounter (Signed)
Please advise if ok to send current dose below or will we be increasing dose?

## 2014-06-19 NOTE — Telephone Encounter (Signed)
Caller name: Luevenia Relation to pt: self  Call back number: [phone]   Reason for call:   Requesting a refill  amphetamine-dextroamphetamine (ADDERALL XR) 20 MG 24 hr capsule & amphetamine-dextroamphetamine (ADDERALL) 10 MG tablet

## 2014-06-19 NOTE — Telephone Encounter (Signed)
Please review unread mychart message with pt.  I was going to recommend focalin instead if she would like to try.

## 2014-06-20 MED ORDER — DEXMETHYLPHENIDATE HCL 2.5 MG PO TABS: 2.5000 mg | ORAL_TABLET | Freq: Two times a day (BID) | ORAL | Status: DC

## 2014-06-20 NOTE — Telephone Encounter (Signed)
Notified pt and she voices understanding. Rx sent to front desk for pick up and pt will keep f/u on 07/17/14 as scheduled.

## 2014-06-20 NOTE — Telephone Encounter (Signed)
Stop adderall, start focalin, follow up in 1 month. See rx.

## 2014-06-20 NOTE — Telephone Encounter (Signed)
Spoke with pt. She is willing to proceed change to Focalin.

## 2014-06-30 ENCOUNTER — Encounter: Payer: Self-pay | Admitting: Family

## 2014-07-17 ENCOUNTER — Encounter: Payer: Self-pay | Admitting: Family

## 2014-07-17 ENCOUNTER — Ambulatory Visit (INDEPENDENT_AMBULATORY_CARE_PROVIDER_SITE_OTHER): Payer: 59 | Admitting: Family

## 2014-07-17 VITALS — BP 110/80 | HR 64 | Temp 97.8°F | Resp 16 | Ht 66.0 in | Wt 179.0 lb

## 2014-07-17 DIAGNOSIS — R635 Abnormal weight gain: Secondary | ICD-10-CM

## 2014-07-17 DIAGNOSIS — F988 Other specified behavioral and emotional disorders with onset usually occurring in childhood and adolescence: Secondary | ICD-10-CM

## 2014-07-17 DIAGNOSIS — F909 Attention-deficit hyperactivity disorder, unspecified type: Secondary | ICD-10-CM

## 2014-07-17 MED ORDER — DEXMETHYLPHENIDATE HCL 5 MG PO TABS: 5.0000 mg | ORAL_TABLET | Freq: Two times a day (BID) | ORAL | Status: DC

## 2014-07-17 NOTE — Progress Notes (Signed)
Pre visit review using our clinic review tool, if applicable. No additional management support is needed unless otherwise documented below in the visit note. 

## 2014-07-17 NOTE — Progress Notes (Signed)
Subjective:    Patient ID: [name], female    DOB: 01/02/1971, 43 y.o.   MRN: [id number]  HPI  [name] is a 43 yr old female who presents today for follow up.  1) ADHD-  Last visit she was placed on focalin. Notes mild constipation.  Reports that she has had  Reports BP and HR have both gone down since she started the focalin. She is pleased about this.  Will be switching to MICU from ED.  Concentration is not as good as it was on the adderall but she wants to stick with the focal for now.  Wt Readings from Last 3 Encounters:  07/17/14 179 lb (81.194 kg)  06/14/14 176 lb (79.833 kg)  05/02/14 173 lb 2 oz (78.529 kg)   2) Concerned re: weight gain- she notes more sob with exercise.  Was training for half marathon.  Now only running 3 miles.    Review of Systems See HPI  Past Medical History  Diagnosis Date  . Asthma   . Allergy     allergic rhinitis  . ADD (attention deficit disorder)   . Adjustment disorder     without depression  . Thyroid disease     hyperthyroidism / Graves Disease    History   Social History  . Marital Status: Single    Spouse Name: N/A    Number of Children: 1  . Years of Education: N/A   Occupational History  . RN    Social History Main Topics  . Smoking status: Former Smoker    Types: Cigarettes    Quit date: 07/19/2011  . Smokeless tobacco: Never Used  . Alcohol Use: Yes  . Drug Use: No  . Sexual Activity: Not on file   Other Topics Concern  . Not on file   Social History Narrative    No past surgical history on file.  Family History  Problem Relation Age of Onset  . Diabetes Other   . Stroke Other   . Hypertension Other   . Thyroid disease Other   . Migraines Other     Allergies  Allergen Reactions  . Escitalopram Oxalate   . Montelukast Sodium   . Prednisone     Hot flashes, emotional upset    Current Outpatient Prescriptions on File Prior to Visit  Medication Sig Dispense Refill  . albuterol  (PROAIR HFA) 108 (90 BASE) MCG/ACT inhaler Inhale 2 puffs into the lungs every 6 (six) hours. As needed and before exercise. 3.7 g 2  . cyclobenzaprine (FLEXERIL) 10 MG tablet Take 1 tablet (10 mg total) by mouth 3 (three) times daily as needed for muscle spasms. 60 tablet 0  . dexmethylphenidate (FOCALIN) 2.5 MG tablet Take 1 tablet (2.5 mg total) by mouth 2 (two) times daily. 60 tablet 0  . fexofenadine (ALLEGRA) 180 MG tablet Take 180 mg by mouth daily as needed.     . fluticasone (FLONASE) 50 MCG/ACT nasal spray Place 2 sprays into both nostrils daily. 16 g 6  . mometasone-formoterol (DULERA) 200-5 MCG/ACT AERO Inhale 1 puff into the lungs 2 (two) times daily. 1 Inhaler 3  . Multiple Vitamin (MULTIVITAMIN) tablet Take 1 tablet by mouth daily.    . Norethin Ace-Eth Estrad-FE (MINASTRIN 24 FE) 1-20 MG-MCG(24) CHEW Chew 1 tablet by mouth daily.    . [DISCONTINUED] Norethindrone-Ethinyl Estradiol-Fe Biphas (LO LOESTRIN FE) 1 MG-10 MCG / 10 MCG tablet Take 1 tablet by mouth daily.  No current facility-administered medications on file prior to visit.    BP 110/80 mmHg  Pulse 64  Temp(Src) 97.8 F (36.6 C) (Oral)  Resp 16  Ht 5\' 6"  (1.676 m)  Wt 179 lb (81.194 kg)  BMI 28.91 kg/m2  SpO2 99%       Objective:   Physical Exam  Constitutional: She is oriented to person, place, and time. She appears well-developed and well-nourished. No distress.  HENT:  Head: Normocephalic and atraumatic.  Cardiovascular: Normal rate and regular rhythm.   No murmur heard. Pulmonary/Chest: Effort normal and breath sounds normal. No respiratory distress. She has no wheezes. She has no rales. She exhibits no tenderness.  Musculoskeletal: She exhibits no edema.  Neurological: She is alert and oriented to person, place, and time.  Psychiatric: She has a normal mood and affect. Her behavior is normal. Thought content normal.          Assessment & Plan:

## 2014-07-17 NOTE — Assessment & Plan Note (Signed)
Will increase focalin dose.  Follow up in 1 month.

## 2014-07-17 NOTE — Assessment & Plan Note (Addendum)
Lab Results  Component Value Date   TSH 3.15 06/14/2014   TSH normal, we discussed continuing regular exercise and counting calories goal <1300/day.

## 2014-07-17 NOTE — Patient Instructions (Signed)
Increase focalin 5mg  twice daily.  Follow up in 1 month.

## 2014-08-18 HISTORY — PX: BREAST BIOPSY: SHX20

## 2014-08-22 ENCOUNTER — Encounter: Payer: Self-pay | Admitting: Family

## 2014-08-22 ENCOUNTER — Ambulatory Visit (INDEPENDENT_AMBULATORY_CARE_PROVIDER_SITE_OTHER): Payer: 59 | Admitting: Family

## 2014-08-22 VITALS — BP 110/78 | HR 60 | Temp 97.8°F | Resp 14 | Ht 66.0 in | Wt 177.0 lb

## 2014-08-22 DIAGNOSIS — F909 Attention-deficit hyperactivity disorder, unspecified type: Secondary | ICD-10-CM

## 2014-08-22 DIAGNOSIS — F988 Other specified behavioral and emotional disorders with onset usually occurring in childhood and adolescence: Secondary | ICD-10-CM

## 2014-08-22 DIAGNOSIS — R635 Abnormal weight gain: Secondary | ICD-10-CM

## 2014-08-22 DIAGNOSIS — E663 Overweight: Secondary | ICD-10-CM

## 2014-08-22 MED ORDER — DEXMETHYLPHENIDATE HCL 5 MG PO TABS: 5.0000 mg | ORAL_TABLET | Freq: Two times a day (BID) | ORAL | Status: DC

## 2014-08-22 NOTE — Assessment & Plan Note (Signed)
Stable on current dose of focalin. Will plan to bring the pt back in 3 months. At that point she will be settled in to her new position and we can decide if she needs further adjustment of her focalin.

## 2014-08-22 NOTE — Progress Notes (Signed)
Subjective:    Patient ID: [name], female    DOB: [birth year], 44 y.o.   MRN: [id number]  HPI  [name] is a 44 yr old female who presents today for follow up.   1) ADHD- last visit her focalin was increased.  She is in a new job.  Thinks it is helping but not sure that it is the focalin but feels like challenge is the new job. She is sleeping well.  2) Weight Gain- she has lost 2 pounds since her last visit and is pleased.  Review of Systems See HPI  Past Medical History  Diagnosis Date  . Asthma   . Allergy     allergic rhinitis  . ADD (attention deficit disorder)   . Adjustment disorder     without depression  . Thyroid disease     hyperthyroidism / Graves Disease    History   Social History  . Marital Status: Single    Spouse Name: N/A    Number of Children: 1  . Years of Education: N/A   Occupational History  . RN    Social History Main Topics  . Smoking status: Former Smoker    Types: Cigarettes    Quit date: 07/19/2011  . Smokeless tobacco: Never Used  . Alcohol Use: Yes  . Drug Use: No  . Sexual Activity: Not on file   Other Topics Concern  . Not on file   Social History Narrative    History reviewed. No pertinent past surgical history.  Family History  Problem Relation Age of Onset  . Diabetes Other   . Stroke Other   . Hypertension Other   . Thyroid disease Other   . Migraines Other     Allergies  Allergen Reactions  . Escitalopram Oxalate   . Montelukast Sodium   . Prednisone     Hot flashes, emotional upset    Current Outpatient Prescriptions on File Prior to Visit  Medication Sig Dispense Refill  . albuterol (PROAIR HFA) 108 (90 BASE) MCG/ACT inhaler Inhale 2 puffs into the lungs every 6 (six) hours. As needed and before exercise. 3.7 g 2  . cyclobenzaprine (FLEXERIL) 10 MG tablet Take 1 tablet (10 mg total) by mouth 3 (three) times daily as needed for muscle spasms. 60 tablet 0  . dexmethylphenidate (FOCALIN) 5 MG  tablet Take 1 tablet (5 mg total) by mouth 2 (two) times daily. 60 tablet 0  . fexofenadine (ALLEGRA) 180 MG tablet Take 180 mg by mouth daily as needed.     . fluticasone (FLONASE) 50 MCG/ACT nasal spray Place 2 sprays into both nostrils daily. 16 g 6  . Multiple Vitamin (MULTIVITAMIN) tablet Take 1 tablet by mouth daily.    . Norethin Ace-Eth Estrad-FE (MINASTRIN 24 FE) 1-20 MG-MCG(24) CHEW Chew 1 tablet by mouth daily.    . mometasone-formoterol (DULERA) 200-5 MCG/ACT AERO Inhale 1 puff into the lungs 2 (two) times daily. (Patient not taking: Reported on 08/22/2014) 1 Inhaler 3  . [DISCONTINUED] Norethindrone-Ethinyl Estradiol-Fe Biphas (LO LOESTRIN FE) 1 MG-10 MCG / 10 MCG tablet Take 1 tablet by mouth daily.       No current facility-administered medications on file prior to visit.    BP 110/78 mmHg  Pulse 60  Temp(Src) 97.8 F (36.6 C) (Oral)  Resp 14  Ht 5\' 6"  (1.676 m)  Wt 177 lb (80.287 kg)  BMI 28.58 kg/m2  SpO2 98%       Objective:   Physical  Exam  Constitutional: She is oriented to person, place, and time. She appears well-developed and well-nourished. No distress.  HENT:  Head: Normocephalic and atraumatic.  Cardiovascular: Normal rate and regular rhythm.   No murmur heard. Pulmonary/Chest: Effort normal and breath sounds normal. No respiratory distress. She has no wheezes. She has no rales. She exhibits no tenderness.  Musculoskeletal: She exhibits no edema.  Neurological: She is alert and oriented to person, place, and time.  Psychiatric: She has a normal mood and affect. Her behavior is normal. Judgment and thought content normal.          Assessment & Plan:

## 2014-08-22 NOTE — Assessment & Plan Note (Signed)
She has lost 2 pounds and I commended her on this.

## 2014-08-22 NOTE — Progress Notes (Signed)
Pre visit review using our clinic review tool, if applicable. No additional management support is needed unless otherwise documented below in the visit note. 

## 2014-08-22 NOTE — Patient Instructions (Signed)
Please schedule a follow up appointment in 3 months.

## 2014-10-27 ENCOUNTER — Telehealth: Payer: Self-pay | Admitting: Family

## 2014-10-27 MED ORDER — DEXMETHYLPHENIDATE HCL 5 MG PO TABS: 5.0000 mg | ORAL_TABLET | Freq: Two times a day (BID) | ORAL | Status: DC

## 2014-10-27 NOTE — Telephone Encounter (Signed)
Rx placed at front desk for pick up and pt notified.

## 2014-10-27 NOTE — Telephone Encounter (Signed)
Written rx for focalin 5mg  2 times per day

## 2014-10-27 NOTE — Telephone Encounter (Signed)
Last Rx given 08/23/14, pt has f/u 11/27/14. Rx printed and forwarded to Provider for signature.

## 2014-11-06 ENCOUNTER — Encounter: Payer: Self-pay | Admitting: Sports Medicine

## 2014-11-06 ENCOUNTER — Ambulatory Visit (INDEPENDENT_AMBULATORY_CARE_PROVIDER_SITE_OTHER): Payer: 59 | Admitting: Sports Medicine

## 2014-11-06 VITALS — BP 126/79 | Ht 66.0 in | Wt 163.0 lb

## 2014-11-06 DIAGNOSIS — M7662 Achilles tendinitis, left leg: Secondary | ICD-10-CM

## 2014-11-06 DIAGNOSIS — M25579 Pain in unspecified ankle and joints of unspecified foot: Secondary | ICD-10-CM

## 2014-11-06 MED ORDER — NAPROXEN 500 MG PO TABS: ORAL_TABLET | ORAL | Status: DC

## 2014-11-06 NOTE — Progress Notes (Signed)
   Subjective:    Patient ID: [name], female    DOB: 1971-08-15, 44 y.o.   MRN: [id number]  HPI chief complaint: Left ankle pain  Patient comes in today complaining of 2 weeks of pain that she localizes to the left Achilles. No trauma that she knows of. She is currently training for a half marathon and after doing a 9 mile run began to experience some pain and swelling along the distal Achilles. Symptoms improved with a couple days of rest but returned several days later with running. She has not run for the past 5 days and as a result her symptoms have improved. She is also taking naproxen prn and using ice. Also using a compression sleeve from a prior injury that she had. All of this seems to be helping. No significant problems with her Achilles in the past. No associated numbness or tingling.  Past medical history reviewed Medications reviewed Allergies reviewed    Review of Systems     Objective:   Physical Exam Well-developed, well-nourished. No acute distress. Awake alert and oriented 3. Vital signs reviewed.  Left ankle: There is a very mild amount of soft tissue swelling along the distal Achilles tendon. It is nontender to palpation. No significant thickening. No pain with heel cord stretch. Good plantar flexion with Thompson's testing. No tenderness to palpation along the peroneal tendon. Neurovascularly intact distally. Walking without a limp.  MSK ultrasound of the left Achilles was performed. There is some slight thickening of the Achilles tendon approximately 3-4 cm proximal to the insertion onto the calcaneus. There is fluid in this area as well. Findings are consistent with Achilles tendon strain.       Assessment & Plan:  Left Achilles strain  Upon further questioning it turns out the patient has significantly increased her distance over a very short period of time. I think this is why she has inflamed her Achilles tendon. I've recommended that she lower  expectations and instead consider the 10K race instead of the half marathon until she is in a position to where she can train more properly. She is in agreement with this. I will treat her with green sports insoles with 5/16 inch heel lifts, Alfredson heel drop exercises, prescription strength naproxen sodium 500 mg twice daily for 7 days, and daily icing. She will cross train for the remainder of this week and then she may start to return to some light running as symptoms allow. She will return to the office in 3 weeks for reexamination and repeat ultrasound. We also discussed the possibility of custom orthotics sometime in the very near future.

## 2014-11-09 ENCOUNTER — Encounter: Payer: Self-pay | Admitting: Family

## 2014-11-09 ENCOUNTER — Ambulatory Visit (INDEPENDENT_AMBULATORY_CARE_PROVIDER_SITE_OTHER): Payer: 59 | Admitting: Family

## 2014-11-09 VITALS — BP 100/70 | HR 52 | Temp 98.0°F | Resp 16 | Ht 66.0 in | Wt 164.6 lb

## 2014-11-09 DIAGNOSIS — F988 Other specified behavioral and emotional disorders with onset usually occurring in childhood and adolescence: Secondary | ICD-10-CM

## 2014-11-09 DIAGNOSIS — F909 Attention-deficit hyperactivity disorder, unspecified type: Secondary | ICD-10-CM

## 2014-11-09 DIAGNOSIS — J45909 Unspecified asthma, uncomplicated: Secondary | ICD-10-CM

## 2014-11-09 MED ORDER — FLUTICASONE PROPIONATE 50 MCG/ACT NA SUSP: 2.0000 | Freq: Every day | NASAL | Status: DC

## 2014-11-09 MED ORDER — DEXMETHYLPHENIDATE HCL 10 MG PO TABS: 10.0000 mg | ORAL_TABLET | Freq: Two times a day (BID) | ORAL | Status: DC

## 2014-11-09 NOTE — Progress Notes (Signed)
Subjective:    Patient ID: [name], female    DOB: October 07, 1970, 44 y.o.   MRN: [id number]  HPI  Presents for follow up of her ADHD.  Only taking focalin on the days that she works. Notes that her concentration is still impaired at times.   Asthma- stable, not using dulera. Only needs albuterol prior to exercise.   Has lost weight with diet and exercise.  Wt Readings from Last 3 Encounters:  11/09/14 164 lb 9.6 oz (74.662 kg)  11/06/14 163 lb (73.936 kg)  08/22/14 177 lb (80.287 kg)    Review of Systems    see HPI  Past Medical History  Diagnosis Date  . Asthma   . Allergy     allergic rhinitis  . ADD (attention deficit disorder)   . Adjustment disorder     without depression  . Thyroid disease     hyperthyroidism / Graves Disease    History   Social History  . Marital Status: Single    Spouse Name: N/A  . Number of Children: 1  . Years of Education: N/A   Occupational History  . RN    Social History Main Topics  . Smoking status: Former Smoker    Types: Cigarettes    Quit date: 07/19/2011  . Smokeless tobacco: Never Used  . Alcohol Use: Yes  . Drug Use: No  . Sexual Activity: Not on file   Other Topics Concern  . Not on file   Social History Narrative    No past surgical history on file.  Family History  Problem Relation Age of Onset  . Diabetes Other   . Stroke Other   . Hypertension Other   . Thyroid disease Other   . Migraines Other     Allergies  Allergen Reactions  . Escitalopram Oxalate   . Montelukast Sodium   . Prednisone     Hot flashes, emotional upset    Current Outpatient Prescriptions on File Prior to Visit  Medication Sig Dispense Refill  . albuterol (PROAIR HFA) 108 (90 BASE) MCG/ACT inhaler Inhale 2 puffs into the lungs every 6 (six) hours. As needed and before exercise. 3.7 g 2  . cyclobenzaprine (FLEXERIL) 10 MG tablet Take 1 tablet (10 mg total) by mouth 3 (three) times daily as needed for muscle spasms. 60  tablet 0  . fexofenadine (ALLEGRA) 180 MG tablet Take 180 mg by mouth daily as needed.     . mometasone-formoterol (DULERA) 200-5 MCG/ACT AERO Inhale 1 puff into the lungs 2 (two) times daily. 1 Inhaler 3  . Multiple Vitamin (MULTIVITAMIN) tablet Take 1 tablet by mouth daily.    . naproxen (NAPROSYN) 500 MG tablet Take one pill twice a day with food for 7 days then as needed 40 tablet 0  . Norethin Ace-Eth Estrad-FE (MINASTRIN 24 FE) 1-20 MG-MCG(24) CHEW Chew 1 tablet by mouth daily.    . [DISCONTINUED] Norethindrone-Ethinyl Estradiol-Fe Biphas (LO LOESTRIN FE) 1 MG-10 MCG / 10 MCG tablet Take 1 tablet by mouth daily.       No current facility-administered medications on file prior to visit.    BP 100/70 mmHg  Pulse 52  Temp(Src) 98 F (36.7 C) (Oral)  Resp 16  Ht 5\' 6"  (1.676 m)  Wt 164 lb 9.6 oz (74.662 kg)  BMI 26.58 kg/m2  SpO2 99%    Objective:   Physical Exam  Constitutional: She is oriented to person, place, and time. She appears well-developed and well-nourished.  Cardiovascular: Normal rate, regular rhythm and normal heart sounds.   No murmur heard. Pulmonary/Chest: Effort normal and breath sounds normal. No respiratory distress. She has no wheezes.  Neurological: She is alert and oriented to person, place, and time.  Psychiatric: She has a normal mood and affect. Her behavior is normal. Judgment and thought content normal.          Assessment & Plan:

## 2014-11-09 NOTE — Assessment & Plan Note (Signed)
Stable with albuterol only.

## 2014-11-09 NOTE — Progress Notes (Signed)
Pre visit review using our clinic review tool, if applicable. No additional management support is needed unless otherwise documented below in the visit note. 

## 2014-11-09 NOTE — Assessment & Plan Note (Signed)
Uncontrolled.  Increase focalin from 5mg  bid to 10mg  bid.

## 2014-11-09 NOTE — Patient Instructions (Signed)
Increase focalin from 5mg  to 10mg  twice daily. Follow up in 6 weeks.

## 2014-11-27 ENCOUNTER — Ambulatory Visit: Payer: 59 | Admitting: Family

## 2014-11-27 ENCOUNTER — Ambulatory Visit (INDEPENDENT_AMBULATORY_CARE_PROVIDER_SITE_OTHER): Payer: 59 | Admitting: Sports Medicine

## 2014-11-27 ENCOUNTER — Encounter: Payer: Self-pay | Admitting: Sports Medicine

## 2014-11-27 VITALS — BP 115/74 | HR 55 | Ht 66.0 in | Wt 164.0 lb

## 2014-11-27 DIAGNOSIS — M7662 Achilles tendinitis, left leg: Secondary | ICD-10-CM

## 2014-11-27 NOTE — Progress Notes (Signed)
   Subjective:    Patient ID: [name], female    DOB: [birth year], 44 y.o.   MRN: [id number]  HPI   Patient presents today for follow-up on left Achilles tendon strain. Overall she is doing better. Pain has resolved. However she is still getting some swelling with running. She took 2 weeks off of running but once she started running again she noticed increased swelling. Again no pain. She is taking ibuprofen as needed but has not taken any sort of scheduled anti-inflammatory.    Review of Systems     Objective:   Physical Exam Well-developed, well-nourished. No acute distress  Left Achilles: Slight thickening of the midportion of the Achilles tendon. Slight edema as well. No tenderness to palpation. No pain with Achilles stretch. Neurovascular intact distally. Walking without a significant limp.  MSK ultrasound of the left Achilles still shows some thickening of the midportion of the Achilles tendon. There is also some residual swelling along the lateral most portion of the tendon.       Assessment & Plan:  Left Achilles tendon strain  Given her persistent swelling I recommended that she refrain from running for an additional 1-2 weeks. She can continue crosstraining. Continue with her heel lifts and continue with her home exercise program. I've encouraged her to use either naproxen sodium or Aleve for the next 4-5 days. At this point I would expect her symptoms to resolve over the next 2-3 weeks and if this is not the case she is to return to the office for reevaluation at which point I would consider starting a nitroglycerin protocol.

## 2014-12-18 NOTE — Patient Instructions (Addendum)
   Your procedure is scheduled on: Thursday, May 5  Enter through the Main Entrance of [facility] at: 6 AM Pick up the phone at the desk and dial [phone] and inform us of your arrival.  Please call this number if you have any problems the morning of surgery: [phone]  Remember: Do not eat or drink after midnight: Wednesday Take these medicines the morning of surgery with a SIP OF WATER:  None.  Bring albuterol inhaler with you on day of surgery.  Do not wear jewelry, make-up, or FINGER nail polish No metal in your hair or on your body. Do not wear lotions, powders, perfumes.  You may wear deodorant.  Do not bring valuables to the hospital. Contacts, dentures or bridgework may not be worn into surgery.  Patients discharged on the day of surgery will not be allowed to drive home.  Home with mother Enid [place] cell [phone]

## 2014-12-19 ENCOUNTER — Encounter (HOSPITAL_COMMUNITY)
Admission: RE | Admit: 2014-12-19 | Discharge: 2014-12-19 | Disposition: A | Discharge status: Home / Self Care | Payer: 59 | Source: Ambulatory Visit | Attending: Obstetrics and Gynecology | Admitting: Obstetrics and Gynecology

## 2014-12-19 ENCOUNTER — Encounter (HOSPITAL_COMMUNITY): Payer: Self-pay

## 2014-12-19 DIAGNOSIS — Z87891 Personal history of nicotine dependence: Secondary | ICD-10-CM | POA: Diagnosis not present

## 2014-12-19 DIAGNOSIS — N809 Endometriosis, unspecified: Secondary | ICD-10-CM | POA: Diagnosis not present

## 2014-12-19 DIAGNOSIS — N920 Excessive and frequent menstruation with regular cycle: Secondary | ICD-10-CM | POA: Diagnosis not present

## 2014-12-19 DIAGNOSIS — Z888 Allergy status to other drugs, medicaments and biological substances status: Secondary | ICD-10-CM | POA: Diagnosis not present

## 2014-12-19 DIAGNOSIS — E0590 Thyrotoxicosis, unspecified without thyrotoxic crisis or storm: Secondary | ICD-10-CM | POA: Diagnosis not present

## 2014-12-19 DIAGNOSIS — J45909 Unspecified asthma, uncomplicated: Secondary | ICD-10-CM | POA: Diagnosis not present

## 2014-12-19 DIAGNOSIS — Z302 Encounter for sterilization: Secondary | ICD-10-CM | POA: Diagnosis not present

## 2014-12-19 HISTORY — DX: Personal history of nicotine dependence: Z87.891

## 2014-12-19 LAB — CBC
HCT: 38 % (ref 36.0–46.0)
Hemoglobin: 12.9 g/dL (ref 12.0–15.0)
MCH: 31.6 pg (ref 26.0–34.0)
MCHC: 33.9 g/dL (ref 30.0–36.0)
MCV: 93.1 fL (ref 78.0–100.0)
PLATELETS: 218 10*3/uL (ref 150–400)
RBC: 4.08 MIL/uL (ref 3.87–5.11)
RDW: 12.7 % (ref 11.5–15.5)
WBC: 5.2 10*3/uL (ref 4.0–10.5)

## 2014-12-20 MED ORDER — CEFAZOLIN SODIUM-DEXTROSE 2-3 GM-% IV SOLR: 2.0000 g | INTRAVENOUS | Status: DC

## 2014-12-20 NOTE — H&P (Signed)
  44 year old G3 P 1 presents for permanent sterilization and treatment of menorrhagia. Ultrasound in office - normal uterus with thickness 2.1.  Past Medical History  Diagnosis Date  . Asthma   . Allergy     allergic rhinitis  . ADD (attention deficit disorder)   . Adjustment disorder     without depression  . Thyroid disease     hyperthyroidism / Graves Disease - no treatment needed for years per patient  . SVD (spontaneous vaginal delivery)     x 1  . Former smoker    Past Surgical History  Procedure Laterality Date  . Right ankle surgery  2002  . Breast surgery  2004    augmentation  . Wisdom tooth extraction     Escitalopram oxalate; Montelukast sodium; Wellbutrin; and Prednisone History   Social History  . Marital Status: Single    Spouse Name: N/A  . Number of Children: 1  . Years of Education: N/A   Occupational History  . RN    Social History Main Topics  . Smoking status: Former Smoker -- 0.50 packs/day for 15 years    Types: Cigarettes    Quit date: 07/19/2011  . Smokeless tobacco: Never Used  . Alcohol Use: Yes     Comment: socially  . Drug Use: No  . Sexual Activity: Yes    Birth Control/ Protection: Pill     Comment: on loestrin    Other Topics Concern  . Not on file   Social History Narrative   Family History  Problem Relation Age of Onset  . Diabetes Other   . Stroke Other   . Hypertension Other   . Thyroid disease Other   . Migraines Other    No current facility-administered medications on file prior to encounter.   Current Outpatient Prescriptions on File Prior to Encounter  Medication Sig Dispense Refill  . albuterol (PROAIR HFA) 108 (90 BASE) MCG/ACT inhaler Inhale 2 puffs into the lungs every 6 (six) hours. As needed and before exercise. 3.7 g 2  . cyclobenzaprine (FLEXERIL) 10 MG tablet Take 1 tablet (10 mg total) by mouth 3 (three) times daily as needed for muscle spasms. 60 tablet 0  . fexofenadine (ALLEGRA) 180 MG tablet Take  180 mg by mouth daily as needed.     . Multiple Vitamin (MULTIVITAMIN) tablet Take 1 tablet by mouth daily.    . Norethin Ace-Eth Estrad-FE (MINASTRIN 24 FE) 1-20 MG-MCG(24) CHEW Chew 1 tablet by mouth daily.    . [DISCONTINUED] Norethindrone-Ethinyl Estradiol-Fe Biphas (LO LOESTRIN FE) 1 MG-10 MCG / 10 MCG tablet Take 1 tablet by mouth daily.       There were no vitals taken for this visit. No results found for this or any previous visit (from the past 24 hour(s)). General alert and oriented Lung CTAB Car RRR Abdomen is soft and non tender Pelvic is normal  IMPRESSION: Desires permanent sterilization Menorrhagia  PLAN: [place] [facility] HTA Risks reviewed  Consent signed

## 2014-12-21 ENCOUNTER — Ambulatory Visit (HOSPITAL_COMMUNITY): Payer: 59

## 2014-12-21 ENCOUNTER — Ambulatory Visit (HOSPITAL_COMMUNITY)
Admission: RE | Admit: 2014-12-21 | Discharge: 2014-12-21 | Disposition: A | Discharge status: Home / Self Care | Payer: 59 | Source: Ambulatory Visit | Attending: Obstetrics and Gynecology | Admitting: Obstetrics and Gynecology

## 2014-12-21 ENCOUNTER — Encounter (HOSPITAL_COMMUNITY): Payer: Self-pay

## 2014-12-21 ENCOUNTER — Encounter (HOSPITAL_COMMUNITY)
Admission: RE | Disposition: A | Discharge status: Home / Self Care | Payer: Self-pay | Source: Ambulatory Visit | Attending: Obstetrics and Gynecology

## 2014-12-21 DIAGNOSIS — Z888 Allergy status to other drugs, medicaments and biological substances status: Secondary | ICD-10-CM | POA: Insufficient documentation

## 2014-12-21 DIAGNOSIS — N809 Endometriosis, unspecified: Secondary | ICD-10-CM | POA: Insufficient documentation

## 2014-12-21 DIAGNOSIS — Z87891 Personal history of nicotine dependence: Secondary | ICD-10-CM | POA: Insufficient documentation

## 2014-12-21 DIAGNOSIS — N920 Excessive and frequent menstruation with regular cycle: Secondary | ICD-10-CM | POA: Insufficient documentation

## 2014-12-21 DIAGNOSIS — E0590 Thyrotoxicosis, unspecified without thyrotoxic crisis or storm: Secondary | ICD-10-CM | POA: Insufficient documentation

## 2014-12-21 DIAGNOSIS — Z302 Encounter for sterilization: Secondary | ICD-10-CM | POA: Diagnosis not present

## 2014-12-21 DIAGNOSIS — J45909 Unspecified asthma, uncomplicated: Secondary | ICD-10-CM | POA: Insufficient documentation

## 2014-12-21 HISTORY — PX: LAPAROSCOPIC TUBAL LIGATION: SHX1937

## 2014-12-21 HISTORY — PX: DILITATION & CURRETTAGE/HYSTROSCOPY WITH HYDROTHERMAL ABLATION: SHX5570

## 2014-12-21 HISTORY — PX: TUBAL LIGATION: SHX77

## 2014-12-21 LAB — PREGNANCY, URINE: Preg Test, Ur: NEGATIVE

## 2014-12-21 SURGERY — LIGATION, FALLOPIAN TUBE, LAPAROSCOPIC
Anesthesia: General | Site: Uterus

## 2014-12-21 MED ORDER — FERRIC SUBSULFATE 259 MG/GM EX SOLN
CUTANEOUS | Status: AC
  Filled 2014-12-21: qty 8

## 2014-12-21 MED ORDER — NEOSTIGMINE METHYLSULFATE 10 MG/10ML IV SOLN
INTRAVENOUS | Status: AC
Start: 2014-12-21 — End: 2014-12-21
  Filled 2014-12-21: qty 1

## 2014-12-21 MED ORDER — ONDANSETRON HCL 4 MG/2ML IJ SOLN
INTRAMUSCULAR | Status: DC | PRN
  Administered 2014-12-21: 4 mg via INTRAVENOUS

## 2014-12-21 MED ORDER — FENTANYL CITRATE (PF) 100 MCG/2ML IJ SOLN
25.0000 ug | INTRAMUSCULAR | Status: DC | PRN
  Administered 2014-12-21: 50 ug via INTRAVENOUS

## 2014-12-21 MED ORDER — MEPERIDINE HCL 25 MG/ML IJ SOLN: 6.2500 mg | INTRAMUSCULAR | Status: DC | PRN

## 2014-12-21 MED ORDER — MIDAZOLAM HCL 2 MG/2ML IJ SOLN
INTRAMUSCULAR | Status: AC
  Filled 2014-12-21: qty 2

## 2014-12-21 MED ORDER — OXYCODONE-ACETAMINOPHEN 5-325 MG PO TABS
1.0000 | ORAL_TABLET | ORAL | Status: DC | PRN
  Administered 2014-12-21: 1 via ORAL

## 2014-12-21 MED ORDER — FENTANYL CITRATE (PF) 100 MCG/2ML IJ SOLN
INTRAMUSCULAR | Status: AC
  Filled 2014-12-21: qty 2

## 2014-12-21 MED ORDER — FENTANYL CITRATE (PF) 250 MCG/5ML IJ SOLN
INTRAMUSCULAR | Status: AC
  Filled 2014-12-21: qty 5

## 2014-12-21 MED ORDER — FENTANYL CITRATE (PF) 100 MCG/2ML IJ SOLN
INTRAMUSCULAR | Status: DC | PRN
  Administered 2014-12-21 (×2): 100 ug via INTRAVENOUS
  Administered 2014-12-21: 50 ug via INTRAVENOUS

## 2014-12-21 MED ORDER — GLYCOPYRROLATE 0.2 MG/ML IJ SOLN
INTRAMUSCULAR | Status: AC
  Filled 2014-12-21: qty 3

## 2014-12-21 MED ORDER — BUPIVACAINE HCL (PF) 0.25 % IJ SOLN
INTRAMUSCULAR | Status: DC | PRN
  Administered 2014-12-21: 9 mL

## 2014-12-21 MED ORDER — LACTATED RINGERS IV SOLN: INTRAVENOUS | Status: DC

## 2014-12-21 MED ORDER — NEOSTIGMINE METHYLSULFATE 10 MG/10ML IV SOLN
INTRAVENOUS | Status: DC | PRN
  Administered 2014-12-21: 3 mg via INTRAVENOUS

## 2014-12-21 MED ORDER — SODIUM CHLORIDE 0.9 % IJ SOLN
INTRAMUSCULAR | Status: DC | PRN
  Administered 2014-12-21: 10 mL

## 2014-12-21 MED ORDER — ROCURONIUM BROMIDE 100 MG/10ML IV SOLN
INTRAVENOUS | Status: DC | PRN
  Administered 2014-12-21: 20 mg via INTRAVENOUS

## 2014-12-21 MED ORDER — LIDOCAINE HCL (CARDIAC) 20 MG/ML IV SOLN
INTRAVENOUS | Status: DC | PRN
  Administered 2014-12-21: 80 mg via INTRAVENOUS

## 2014-12-21 MED ORDER — ONDANSETRON HCL 4 MG/2ML IJ SOLN
INTRAMUSCULAR | Status: AC
  Filled 2014-12-21: qty 2

## 2014-12-21 MED ORDER — LIDOCAINE HCL 1 % IJ SOLN
INTRAMUSCULAR | Status: AC
  Filled 2014-12-21: qty 20

## 2014-12-21 MED ORDER — GLYCOPYRROLATE 0.2 MG/ML IJ SOLN
INTRAMUSCULAR | Status: DC | PRN
  Administered 2014-12-21: 0.6 mg via INTRAVENOUS

## 2014-12-21 MED ORDER — SCOPOLAMINE 1 MG/3DAYS TD PT72
MEDICATED_PATCH | TRANSDERMAL | Status: AC
  Filled 2014-12-21: qty 1

## 2014-12-21 MED ORDER — ROCURONIUM BROMIDE 100 MG/10ML IV SOLN
INTRAVENOUS | Status: AC
  Filled 2014-12-21: qty 1

## 2014-12-21 MED ORDER — ONDANSETRON HCL 4 MG/2ML IJ SOLN: 4.0000 mg | Freq: Once | INTRAMUSCULAR | Status: DC | PRN

## 2014-12-21 MED ORDER — LACTATED RINGERS IV SOLN
INTRAVENOUS | Status: DC
  Administered 2014-12-21 (×2): via INTRAVENOUS

## 2014-12-21 MED ORDER — LIDOCAINE HCL (CARDIAC) 20 MG/ML IV SOLN
INTRAVENOUS | Status: AC
  Filled 2014-12-21: qty 5

## 2014-12-21 MED ORDER — PROPOFOL 10 MG/ML IV BOLUS
INTRAVENOUS | Status: DC | PRN
  Administered 2014-12-21: 200 mg via INTRAVENOUS

## 2014-12-21 MED ORDER — MIDAZOLAM HCL 2 MG/2ML IJ SOLN
INTRAMUSCULAR | Status: DC | PRN
  Administered 2014-12-21: 2 mg via INTRAVENOUS

## 2014-12-21 MED ORDER — BUPIVACAINE HCL (PF) 0.25 % IJ SOLN
INTRAMUSCULAR | Status: AC
  Filled 2014-12-21: qty 30

## 2014-12-21 MED ORDER — KETOROLAC TROMETHAMINE 30 MG/ML IJ SOLN: 30.0000 mg | Freq: Once | INTRAMUSCULAR | Status: DC | PRN

## 2014-12-21 MED ORDER — OXYCODONE-ACETAMINOPHEN 5-325 MG PO TABS
ORAL_TABLET | ORAL | Status: AC
  Filled 2014-12-21: qty 1

## 2014-12-21 MED ORDER — SCOPOLAMINE 1 MG/3DAYS TD PT72
1.0000 | MEDICATED_PATCH | Freq: Once | TRANSDERMAL | Status: DC
  Administered 2014-12-21: 1.5 mg via TRANSDERMAL

## 2014-12-21 MED ORDER — CEFAZOLIN SODIUM-DEXTROSE 2-3 GM-% IV SOLR
INTRAVENOUS | Status: AC
  Administered 2014-12-21: 2 g via INTRAVENOUS
  Filled 2014-12-21: qty 50

## 2014-12-21 MED ORDER — PROPOFOL 10 MG/ML IV BOLUS
INTRAVENOUS | Status: AC
  Filled 2014-12-21: qty 20

## 2014-12-21 MED ORDER — OXYCODONE-ACETAMINOPHEN 10-325 MG PO TABS: 1.0000 | ORAL_TABLET | ORAL | Status: DC | PRN

## 2014-12-21 SURGICAL SUPPLY — 28 items
CANISTER SUCT 3000ML (MISCELLANEOUS) ×3 IMPLANT
CATH ROBINSON RED A/P 16FR (CATHETERS) ×3 IMPLANT
CHLORAPREP W/TINT 26ML (MISCELLANEOUS) ×3 IMPLANT
CLOTH BEACON ORANGE TIMEOUT ST (SAFETY) ×3 IMPLANT
CONTAINER PREFILL 10% NBF 60ML (FORM) ×6 IMPLANT
DRSG COVADERM PLUS 2X2 (GAUZE/BANDAGES/DRESSINGS) ×6 IMPLANT
DRSG OPSITE POSTOP 3X4 (GAUZE/BANDAGES/DRESSINGS) ×1 IMPLANT
ELECT REM PT RETURN 9FT ADLT (ELECTROSURGICAL) ×3
ELECTRODE REM PT RTRN 9FT ADLT (ELECTROSURGICAL) ×2 IMPLANT
GLOVE BIO SURGEON STRL SZ 6.5 (GLOVE) ×3 IMPLANT
GOWN STRL REUS W/TWL LRG LVL3 (GOWN DISPOSABLE) ×6 IMPLANT
LIQUID BAND (GAUZE/BANDAGES/DRESSINGS) ×3 IMPLANT
LOOP ANGLED CUTTING 22FR (CUTTING LOOP) IMPLANT
NEEDLE INSUFFLATION 120MM (ENDOMECHANICALS) ×3 IMPLANT
PACK LAPAROSCOPY BASIN (CUSTOM PROCEDURE TRAY) ×3 IMPLANT
PACK VAGINAL MINOR WOMEN LF (CUSTOM PROCEDURE TRAY) ×3 IMPLANT
PAD OB MATERNITY 4.3X12.25 (PERSONAL CARE ITEMS) ×3 IMPLANT
PAD POSITIONER PINK NONSTERILE (MISCELLANEOUS) ×3 IMPLANT
SCOPETTES 8  STERILE (MISCELLANEOUS)
SCOPETTES 8 STERILE (MISCELLANEOUS) IMPLANT
SET GENESYS HTA PROCERVA (MISCELLANEOUS) ×3 IMPLANT
SUT VIC AB 3-0 PS2 18 (SUTURE) ×3
SUT VIC AB 3-0 PS2 18XBRD (SUTURE) IMPLANT
SUT VICRYL 0 UR6 27IN ABS (SUTURE) ×3 IMPLANT
TOWEL OR 17X24 6PK STRL BLUE (TOWEL DISPOSABLE) ×6 IMPLANT
TROCAR XCEL DIL TIP R 11M (ENDOMECHANICALS) ×3 IMPLANT
WARMER LAPAROSCOPE (MISCELLANEOUS) ×3 IMPLANT
WATER STERILE IRR 1000ML POUR (IV SOLUTION) ×3 IMPLANT

## 2014-12-21 NOTE — Anesthesia Preprocedure Evaluation (Signed)
Anesthesia Evaluation  Patient identified by MRN, date of birth, ID band Patient awake    Reviewed: Allergy & Precautions, H&P , Patient's Chart, lab work & pertinent test results, reviewed documented beta blocker date and time   Airway Mallampati: II  TM Distance: >3 FB Neck ROM: full    Dental no notable dental hx.    Pulmonary asthma , former smoker,  breath sounds clear to auscultation  Pulmonary exam normal       Cardiovascular Rhythm:regular Rate:Normal     Neuro/Psych    GI/Hepatic   Endo/Other  Hyperthyroidism   Renal/GU      Musculoskeletal   Abdominal   Peds  Hematology   Anesthesia Other Findings   Reproductive/Obstetrics                             Anesthesia Physical Anesthesia Plan  ASA: II  Anesthesia Plan: General   Post-op Pain Management:    Induction: Intravenous  Airway Management Planned: Oral ETT  Additional Equipment:   Intra-op Plan:   Post-operative Plan: Extubation in OR  Informed Consent: I have reviewed the patients History and Physical, chart, labs and discussed the procedure including the risks, benefits and alternatives for the proposed anesthesia with the patient or authorized representative who has indicated his/her understanding and acceptance.   Dental Advisory Given and Dental advisory given  Plan Discussed with: CRNA and Surgeon  Anesthesia Plan Comments: (  Discussed general anesthesia, including possible nausea, instrumentation of airway, sore throat,pulmonary aspiration, etc. I asked if the were any outstanding questions, or  concerns before we proceeded. )        Anesthesia Quick Evaluation

## 2014-12-21 NOTE — Anesthesia Postprocedure Evaluation (Signed)
Anesthesia Post Note  Patient: [name]  Procedure(s) Performed: Procedure(s) (LRB): LAPAROSCOPIC TUBAL LIGATION (Bilateral) DILATATION & CURETTAGE/HYSTEROSCOPY WITH HYDROTHERMAL ABLATION (N/A)  Anesthesia type: General  Patient location: PACU  Post pain: Pain level controlled  Post assessment: Post-op Vital signs reviewed  Last Vitals:  Filed Vitals:   12/21/14 0945  BP: 116/84  Pulse: 54  Temp: 36.4 C  Resp: 13    Post vital signs: Reviewed  Level of consciousness: sedated  Complications: No apparent anesthesia complications

## 2014-12-21 NOTE — Anesthesia Procedure Notes (Signed)
Procedure Name: Intubation Date/Time: 12/21/2014 7:36 AM Performed by: Dian Queen Pre-anesthesia Checklist: Patient identified, Emergency Drugs available, Suction available and Patient being monitored Patient Re-evaluated:Patient Re-evaluated prior to inductionOxygen Delivery Method: Circle system utilized Preoxygenation: Pre-oxygenation with 100% oxygen Intubation Type: IV induction Ventilation: Mask ventilation without difficulty Laryngoscope Size: Miller and 2 Grade View: Grade II Tube type: Oral Number of attempts: 1 Airway Equipment and Method: Stylet Placement Confirmation: ETT inserted through vocal cords under direct vision,  positive ETCO2 and breath sounds checked- equal and bilateral Secured at: 20 cm Tube secured with: Tape Dental Injury: Teeth and Oropharynx as per pre-operative assessment

## 2014-12-21 NOTE — Discharge Instructions (Signed)

## 2014-12-21 NOTE — Progress Notes (Signed)
Called Dr. Helane Rima to notify her that patient was c/o pain in bladder region.  Made several attempts to void with no success.  Patient was then bladder scanned 150 cc noted in bladder.  Did an I/O on patient and emptied out 175 cc from bladder.  Pain pill given to patient for pain before d/c.    Ricard Dillon, RN

## 2014-12-21 NOTE — Op Note (Signed)
[name], [name]               ACCOUNT NO.:  [id number]  MEDICAL RECORD NO.:  [id number]  LOCATION:  [facility]                          FACILITY:  [facility]  PHYSICIAN:  Denaya Horn L. Cabella Kimm, M.D.DATE OF BIRTH:  May 07, 1971  DATE OF PROCEDURE:  12/21/2014 DATE OF DISCHARGE:                              OPERATIVE REPORT   PREOPERATIVE DIAGNOSES:  Menorrhagia and desires permanent sterilization.  POSTOPERATIVE DIAGNOSES:  Menorrhagia and desires permanent sterilization and endometriotic implant on the right pelvic sidewall and the left uterosacral ligament.  SURGEON:  Shelton Soler L. Helane Rima, M.D.  ANESTHESIA:  General.  EBL:  Minimal.  COMPLICATIONS:  None.  DRAINS:  None.  DESCRIPTION OF PROCEDURE:  The patient was taken to the operating room. She was intubated.  She was prepped and draped in usual sterile fashion. A uterine manipulator was inserted and the bladder was emptied with an in-and-out catheter.  Attention was turned to the abdomen where a small infraumbilical incision was made.  The Veress needle was inserted and pneumoperitoneum was performed without difficulty.  The Veress needle was removed and an 11 mm trocar was inserted.  The laparoscope was introduced through the trocar sheath.  The patient was gently placed in Trendelenburg position.  Her uterus was slightly enlarged.  Her ovaries and fallopian tubes appeared normal.  In the pelvic cul-de-sac and the right and left sidewalls, we could see some small hemosiderin brownish deposits.  She had one small one over the course of the ureter on the right pelvic sidewall, which was not amenable to fulguration.  She also had what looked like some white burned out areas on the sidewall on the right, which probably indicated old endometriosis.  She has been on Minastrin and this may be partially treated her endometriosis.  On the left, she had a deeper lesion that was hemosiderin on the left uterosacral as well.  Photographs were  taken.  This patient may benefit from some type of suppression later on.  The fallopian tubes appeared normal.  I placed a Kleppinger to the operative scope and then performed a modified bilateral tubal ligation using the Kleppinger to burn it with the triple-burn technique to make sure each time the wattage went down to zero.  After we completed the tubal ligation, the scope was removed. The trocar was removed.  After pneumoperitoneum was released, the subcu was closed with 0 Vicryl and the skin was closed with interrupted using 3-0 Vicryl.  A bandage was applied.  At this point, we then turned our attention to the vagina.  A speculum was inserted.  Uterine manipulator was switched out for a tenaculum.  Paracervical block was performed. The [employer] HTA system was primed and the HTA on the hysteroscope was inserted without difficulty.  We had excellent view of the endometrium. The cavity was normal.  Tubal ostia were seen.  No intracavitary lesion was noted.  This was consistent with her preoperative ultrasound.  The HTA was performed for 10 minutes with excellent visualization of an ablation.  We then removed the HTA without any difficulty.  No fluid leakage was noted with a fluid-filled sac and no bleeding was noted. All instruments were removed from the vagina.  The patient went to recovery room in stable condition.     Martin Belling L. Helane Rima, M.D.     [name]  D:  12/21/2014  T:  12/21/2014  Job:  [id number]

## 2014-12-21 NOTE — Brief Op Note (Signed)
12/21/2014  8:35 AM  PATIENT:  Mellody Dance Cedotal  44 y.o. female  PRE-OPERATIVE DIAGNOSIS:  Desires Permanent sterilization, Menorrhagia  POST-OPERATIVE DIAGNOSIS:  Same, Endometriosis involving right pelvic sidewall and left uterosacral ligament  PROCEDURE:  Procedure(s): LAPAROSCOPIC TUBAL LIGATION (Bilateral) DILATATION & CURETTAGE/HYSTEROSCOPY WITH HYDROTHERMAL ABLATION (N/A)  SURGEON:  Surgeon(s) and Role:    * Dian Queen, MD - Primary  PHYSICIAN ASSISTANT:   ASSISTANTS: none   ANESTHESIA:   general and paracervical block  EBL:  Total I/O In: 1000 [I.V.:1000] Out: 50 [Urine:50]  BLOOD ADMINISTERED:none  DRAINS: none   LOCAL MEDICATIONS USED:  MARCAINE    and LIDOCAINE   SPECIMEN:  Source of Specimen:  none  DISPOSITION OF SPECIMEN:  N/A  COUNTS:  YES  TOURNIQUET:  * No tourniquets in log *  DICTATION: .Other Dictation: Dictation Number [id number]  PLAN OF CARE: Discharge to home after PACU  PATIENT DISPOSITION:  PACU - hemodynamically stable.   Delay start of Pharmacological VTE agent (>24hrs) due to surgical blood loss or risk of bleeding: not applicable

## 2014-12-21 NOTE — Progress Notes (Signed)
H and P on the chart No significant changes Proceed with LSC BTL, HTA

## 2014-12-21 NOTE — Transfer of Care (Signed)
Immediate Anesthesia Transfer of Care Note  Patient: [name]  Procedure(s) Performed: Procedure(s): LAPAROSCOPIC TUBAL LIGATION (Bilateral) DILATATION & CURETTAGE/HYSTEROSCOPY WITH HYDROTHERMAL ABLATION (N/A)  Patient Location: PACU  Anesthesia Type:General  Level of Consciousness: awake, alert  and oriented  Airway & Oxygen Therapy: Patient Spontanous Breathing and Patient connected to nasal cannula oxygen  Post-op Assessment: Report given to RN and Post -op Vital signs reviewed and stable  Post vital signs: Reviewed and stable  Last Vitals:  Filed Vitals:   12/21/14 0612  BP: 110/74  Pulse: 61  Temp: 36.9 C  Resp: 18    Complications: No apparent anesthesia complications

## 2014-12-23 ENCOUNTER — Encounter (HOSPITAL_COMMUNITY): Payer: Self-pay | Admitting: Obstetrics and Gynecology

## 2014-12-25 ENCOUNTER — Telehealth: Payer: Self-pay | Admitting: Family

## 2014-12-25 MED ORDER — DEXMETHYLPHENIDATE HCL 10 MG PO TABS: 10.0000 mg | ORAL_TABLET | Freq: Two times a day (BID) | ORAL | Status: DC

## 2014-12-25 NOTE — Telephone Encounter (Signed)
Pt has f/u 01/02/15.  No CSC on file.  Last rx 11/09/14.  Rx printed and forwarded to Provider for signature. Attached CSC with Rx to be signed at pick up.

## 2014-12-25 NOTE — Telephone Encounter (Signed)
rx for focalin 10 mg takes 2 per day

## 2014-12-25 NOTE — Telephone Encounter (Signed)
Rx and contract placed at front desk. Notified pt.

## 2014-12-28 ENCOUNTER — Other Ambulatory Visit: Payer: Self-pay | Admitting: Obstetrics and Gynecology

## 2014-12-28 DIAGNOSIS — N63 Unspecified lump in unspecified breast: Secondary | ICD-10-CM

## 2015-01-02 ENCOUNTER — Other Ambulatory Visit: Payer: Self-pay | Admitting: Obstetrics and Gynecology

## 2015-01-02 ENCOUNTER — Ambulatory Visit
Admission: RE | Admit: 2015-01-02 | Discharge: 2015-01-02 | Disposition: A | Discharge status: Home / Self Care | Payer: 59 | Source: Ambulatory Visit | Attending: Obstetrics and Gynecology | Admitting: Obstetrics and Gynecology

## 2015-01-02 ENCOUNTER — Ambulatory Visit (INDEPENDENT_AMBULATORY_CARE_PROVIDER_SITE_OTHER): Payer: 59 | Admitting: Family

## 2015-01-02 ENCOUNTER — Encounter: Payer: Self-pay | Admitting: Family

## 2015-01-02 VITALS — BP 100/60 | HR 62 | Temp 98.2°F | Resp 16 | Ht 66.0 in | Wt 163.8 lb

## 2015-01-02 DIAGNOSIS — N63 Unspecified lump in unspecified breast: Secondary | ICD-10-CM

## 2015-01-02 DIAGNOSIS — F909 Attention-deficit hyperactivity disorder, unspecified type: Secondary | ICD-10-CM | POA: Diagnosis not present

## 2015-01-02 DIAGNOSIS — F988 Other specified behavioral and emotional disorders with onset usually occurring in childhood and adolescence: Secondary | ICD-10-CM

## 2015-01-02 NOTE — Progress Notes (Signed)
Subjective:    Patient ID: [name], female    DOB: 01-Dec-1970, 44 y.o.   MRN: [id number]  HPI  [name] is a 44 yr old female who presents today for follow up of her ADD.   1) ADD- Last visit she noted that her concentration was not optimal.  We increased her focalin from 5mg  to 10mg  bid. She reports that her concentration is better sustained on the focalin. Sometimes she feels like she does not need the afternoon dose.     Review of Systems     Past Medical History  Diagnosis Date  . Asthma   . Allergy     allergic rhinitis  . ADD (attention deficit disorder)   . Adjustment disorder     without depression  . Thyroid disease     hyperthyroidism / Graves Disease - no treatment needed for years per patient  . SVD (spontaneous vaginal delivery)     x 1  . Former smoker   . Endometriosis     History   Social History  . Marital Status: Single    Spouse Name: N/A  . Number of Children: 1  . Years of Education: N/A   Occupational History  . RN    Social History Main Topics  . Smoking status: Former Smoker -- 0.50 packs/day for 15 years    Types: Cigarettes    Quit date: 07/19/2011  . Smokeless tobacco: Never Used  . Alcohol Use: Yes     Comment: socially  . Drug Use: No  . Sexual Activity: Yes    Birth Control/ Protection: Pill     Comment: on loestrin    Other Topics Concern  . Not on file   Social History Narrative    Past Surgical History  Procedure Laterality Date  . Right ankle surgery  2002  . Breast surgery  2004    augmentation  . Wisdom tooth extraction    . Laparoscopic tubal ligation Bilateral 12/21/2014    Procedure: LAPAROSCOPIC TUBAL LIGATION;  Surgeon: Dian Queen, MD;  Location: [facility];  Service: Gynecology;  Laterality: Bilateral;  . Dilitation & currettage/hystroscopy with hydrothermal ablation N/A 12/21/2014    Procedure: DILATATION & CURETTAGE/HYSTEROSCOPY WITH HYDROTHERMAL ABLATION;  Surgeon: Dian Queen, MD;  Location:  [facility];  Service: Gynecology;  Laterality: N/A;  . Tubal ligation  12/21/14    Family History  Problem Relation Age of Onset  . Diabetes Other   . Stroke Other   . Hypertension Other   . Thyroid disease Other   . Migraines Other     Allergies  Allergen Reactions  . Escitalopram Oxalate Other (See Comments)    Causes tremors.  . Montelukast Sodium Swelling    Causes swelling and pain in the joints.  . Wellbutrin [Bupropion] Other (See Comments)    Did well on a low dose but higher dose caused depression.  . Prednisone Anxiety and Other (See Comments)    Hot flashes, emotional upset    Current Outpatient Prescriptions on File Prior to Visit  Medication Sig Dispense Refill  . albuterol (PROAIR HFA) 108 (90 BASE) MCG/ACT inhaler Inhale 2 puffs into the lungs every 6 (six) hours. As needed and before exercise. 3.7 g 2  . cyclobenzaprine (FLEXERIL) 10 MG tablet Take 1 tablet (10 mg total) by mouth 3 (three) times daily as needed for muscle spasms. 60 tablet 0  . dexmethylphenidate (FOCALIN) 10 MG tablet Take 1 tablet (10 mg total) by mouth 2 (  two) times daily. 60 tablet 0  . fexofenadine (ALLEGRA) 180 MG tablet Take 180 mg by mouth daily as needed.     . fluticasone (FLONASE) 50 MCG/ACT nasal spray Place 2 sprays into both nostrils daily. 16 g 6  . Multiple Vitamin (MULTIVITAMIN) tablet Take 1 tablet by mouth daily.    . naproxen (NAPROSYN) 500 MG tablet Take one pill twice a day with food for 7 days then as needed (Patient taking differently: Take 500 mg by mouth 2 (two) times daily as needed for mild pain or moderate pain. ) 40 tablet 0  . oxyCODONE-acetaminophen (PERCOCET) 10-325 MG per tablet Take 1 tablet by mouth every 4 (four) hours as needed for pain. 30 tablet 0  . [DISCONTINUED] Norethindrone-Ethinyl Estradiol-Fe Biphas (LO LOESTRIN FE) 1 MG-10 MCG / 10 MCG tablet Take 1 tablet by mouth daily.       No current facility-administered medications on file prior to visit.    BP  100/60 mmHg  Pulse 62  Temp(Src) 98.2 F (36.8 C) (Oral)  Resp 16  Ht 5\' 6"  (1.676 m)  Wt 163 lb 12.8 oz (74.299 kg)  BMI 26.45 kg/m2  SpO2 99%  LMP 12/21/2014    Objective:   Physical Exam  Constitutional: She is oriented to person, place, and time. She appears well-developed and well-nourished. No distress.  Neurological: She is alert and oriented to person, place, and time.  Psychiatric: She has a normal mood and affect. Her behavior is normal. Judgment and thought content normal.          Assessment & Plan:

## 2015-01-02 NOTE — Patient Instructions (Addendum)
Please schedule a complete physical at the front desk. Complete urine test in lab (UDS)

## 2015-01-02 NOTE — Progress Notes (Signed)
Pre visit review using our clinic review tool, if applicable. No additional management support is needed unless otherwise documented below in the visit note. 

## 2015-01-02 NOTE — Assessment & Plan Note (Signed)
Improved on current dose of focalin, continue same.

## 2015-01-04 ENCOUNTER — Other Ambulatory Visit: Payer: Self-pay | Admitting: Obstetrics and Gynecology

## 2015-01-04 DIAGNOSIS — N63 Unspecified lump in unspecified breast: Secondary | ICD-10-CM

## 2015-01-12 ENCOUNTER — Ambulatory Visit
Admission: RE | Admit: 2015-01-12 | Discharge: 2015-01-12 | Disposition: A | Discharge status: Home / Self Care | Payer: 59 | Source: Ambulatory Visit | Attending: Obstetrics and Gynecology | Admitting: Obstetrics and Gynecology

## 2015-01-12 ENCOUNTER — Other Ambulatory Visit: Payer: Self-pay | Admitting: Obstetrics and Gynecology

## 2015-01-12 DIAGNOSIS — N63 Unspecified lump in unspecified breast: Secondary | ICD-10-CM

## 2015-01-29 ENCOUNTER — Telehealth: Payer: Self-pay

## 2015-01-29 NOTE — Telephone Encounter (Signed)
Pt. called regarding right biopsy site. She stated that she had drainage from the site and that the clip had come out with the drainage. I asked the patient to come to [facility] to be checked. She was seen by Terie Purser RN, and Dr. Conchita Paris. Right breast site was slightly red, with no visible pus, and otherwise unremarkable. Area re-dressed and the patient left after having questions answered.

## 2015-02-26 ENCOUNTER — Ambulatory Visit (INDEPENDENT_AMBULATORY_CARE_PROVIDER_SITE_OTHER): Payer: 59 | Admitting: Sports Medicine

## 2015-02-26 ENCOUNTER — Encounter: Payer: Self-pay | Admitting: Sports Medicine

## 2015-02-26 VITALS — BP 125/84 | Ht 66.0 in | Wt 164.0 lb

## 2015-02-26 DIAGNOSIS — M7662 Achilles tendinitis, left leg: Secondary | ICD-10-CM | POA: Diagnosis not present

## 2015-02-26 DIAGNOSIS — S86311D Strain of muscle(s) and tendon(s) of peroneal muscle group at lower leg level, right leg, subsequent encounter: Secondary | ICD-10-CM

## 2015-02-26 NOTE — Progress Notes (Signed)
Patient ID: [name], female   DOB: [birth year], 44 y.o.   MRN: [id number]  Patient comes in today for custom orthotics. Please see her previous office notes discussing details of her recent diagnoses of peroneal tendon strain and Achilles tendinopathy. Both are doing well.  Custom orthotics were constructed today. Patient found them to be comfortable prior to leaving the office. A total of 30 minutes was spent with the patient with greater than 50% of the time spent in face-to-face consultation discussing construction, instruction, and fitting. She will continue with her home exercises and follow-up with me when necessary.  Patient was fitted for a : standard, cushioned, semi-rigid orthotic. The orthotic was heated and afterward the patient stood on the orthotic blank positioned on the orthotic stand. The patient was positioned in subtalar neutral position and 10 degrees of ankle dorsiflexion in a weight bearing stance. After completion of molding, a stable base was applied to the orthotic blank. The blank was ground to a stable position for weight bearing. Size: 9 Base: blue EVA Posting: none Additional orthotic padding: none

## 2015-02-28 ENCOUNTER — Telehealth: Payer: Self-pay | Admitting: Family

## 2015-02-28 MED ORDER — DEXMETHYLPHENIDATE HCL 10 MG PO TABS: 10.0000 mg | ORAL_TABLET | Freq: Two times a day (BID) | ORAL | Status: DC

## 2015-02-28 NOTE — Telephone Encounter (Signed)
Caller name: [place] Relation to pt: Call back number: [phone] Pharmacy:  Reason for call:   Patient is requesting focalin refill

## 2015-02-28 NOTE — Telephone Encounter (Signed)
Last Rx 12/25/14. CSC and UDS 05/2014. Last OV 12/2014.  Rx printed and forwarded to PCP for signature.

## 2015-02-28 NOTE — Telephone Encounter (Signed)
Rx signed and placed at front desk for pick up.  Pt notified.

## 2015-05-04 ENCOUNTER — Telehealth: Payer: Self-pay | Admitting: Family

## 2015-05-04 NOTE — Telephone Encounter (Signed)
Relation to ZS:MOLM  Call back number:[phone]   Reason for call:  Patient would like to know if she has had her Hep B titer done. Please advise

## 2015-05-04 NOTE — Telephone Encounter (Signed)
Spoke with pt. 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 May also need quantiferon gold assay (tb). States she will call us back to schedule lab visit if needed.

## 2015-05-09 ENCOUNTER — Telehealth: Payer: Self-pay | Admitting: Family

## 2015-05-09 DIAGNOSIS — Z0184 Encounter for antibody response examination: Secondary | ICD-10-CM

## 2015-05-09 MED ORDER — DEXMETHYLPHENIDATE HCL 10 MG PO TABS: 10.0000 mg | ORAL_TABLET | Freq: Two times a day (BID) | ORAL | Status: DC

## 2015-05-09 NOTE — Telephone Encounter (Signed)
Relation to pt:sel Call back number: [phone]   Reason for call:  Patient requesting a refill dexmethylphenidate (FOCALIN) 10 MG tablet and would like hep b titer done today. Please advise

## 2015-05-09 NOTE — Telephone Encounter (Signed)
Rx signed and placed at front desk for pick up. Per verbal from PCP, ok to do hep B titer.  Lab order entered and message left for pt to return my call.

## 2015-05-09 NOTE — Telephone Encounter (Signed)
Last Rx 02/28/15.  Pt due for UDS. Will have her complete UDS when she picks up Rx. Rx printed and forwarded to PCP for signature.  Please advise re: hepatitis B titer?

## 2015-05-14 NOTE — Telephone Encounter (Signed)
Notified pt of below. Pt states she was able to get in contact with employee health and they have her set up for her flu, TB and Hep B testing. Cancelled future order for Hep B titer.

## 2015-05-29 ENCOUNTER — Ambulatory Visit (INDEPENDENT_AMBULATORY_CARE_PROVIDER_SITE_OTHER): Payer: 59 | Admitting: Sports Medicine

## 2015-05-29 ENCOUNTER — Encounter: Payer: Self-pay | Admitting: Sports Medicine

## 2015-05-29 VITALS — BP 131/75 | HR 71 | Ht 66.0 in | Wt 162.0 lb

## 2015-05-29 DIAGNOSIS — M79662 Pain in left lower leg: Secondary | ICD-10-CM | POA: Diagnosis not present
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
## 2015-06-27 ENCOUNTER — Ambulatory Visit (INDEPENDENT_AMBULATORY_CARE_PROVIDER_SITE_OTHER): Payer: 59 | Admitting: Sports Medicine

## 2015-06-27 ENCOUNTER — Encounter: Payer: Self-pay | Admitting: Sports Medicine

## 2015-06-27 VITALS — BP 117/75 | Ht 66.0 in | Wt 165.0 lb

## 2015-06-27 DIAGNOSIS — S93409A Sprain of unspecified ligament of unspecified ankle, initial encounter: Secondary | ICD-10-CM

## 2015-06-27 NOTE — Progress Notes (Signed)
   Subjective:    Patient ID: [name], female    DOB: [birth year], 44 y.o.   MRN: [id number]  HPI   Patient comes in today for follow-up on bilateral tibial stress reactions. Unfortunately she suffered bilateral inversion injuries to both ankles since her last office visit. This was a couple of weeks ago. As a result she has decided to cancel her half marathon. The pain in her distal tibias has improved. She initially had pain along the lateral ankle and foot after her inversion injuries but her pain is improving. She has been able to fully bear weight with minimal limping. She has been working on stretching out her piriformis. She does feel like this is helping.    Review of Systems     Objective:   Physical Exam Well-developed, well-nourished. No acute distress.  Right ankle: Full range of motion. No effusion. No obvious soft tissue swelling. Slight tenderness to palpation at the ATF with a 1+ talar tilt and positive anterior drawer. No tenderness over the medial malleolus. Slight tenderness along the fifth metatarsal. Neurovascularly intact distally.  Left ankle: Full range of motion. No effusion. Mild soft tissue swelling and ecchymosis along the lateral ankle and foot. Tenderness to palpation at the ATF with a 1+ talar tilt and positive anterior drawer. No tenderness over the medial malleolus. No tenderness at the base of the fifth metatarsal. Neurovascularly intact distally.  Patient is able to run and walk without a limp. Evaluation of her running gait shows a fairly neutral foot strike but slight dynamic genu valgus.  Hip abductor strength is 4+/5 bilaterally.       Assessment & Plan:  Bilateral ankle sprains Mild hip abductor weakness  Patient is reassured that I think her ankle sprains will resolve without complication. I want her to start working on some hip abductor exercises. She is also going to start a boot camp which will probably help with that. She will  continue with her piriformis stretches as well. I think she is safe to resume activity as tolerated and follow-up with me as needed.

## 2015-07-16 ENCOUNTER — Telehealth: Payer: Self-pay | Admitting: Family

## 2015-07-16 MED ORDER — DEXMETHYLPHENIDATE HCL 10 MG PO TABS: 10.0000 mg | ORAL_TABLET | Freq: Two times a day (BID) | ORAL | Status: DC

## 2015-07-16 NOTE — Telephone Encounter (Signed)
Relation to [id number] Call back number:[phone] Pharmacy: [facility] [place]. [phone] (Phone) [phone] (Fax)         Reason for call:  Patient requesting a refill dexmethylphenidate (FOCALIN) 10 MG tablet

## 2015-07-16 NOTE — Telephone Encounter (Signed)
Rx placed at front desk for pick up and left message on pt's voicemail.

## 2015-07-16 NOTE — Telephone Encounter (Signed)
Last focalin Rx 05/09/15. Last UDS 05/2015. Rx printed and forwarded to PCP for signature.

## 2015-09-06 ENCOUNTER — Telehealth: Payer: Self-pay | Admitting: Family

## 2015-09-06 NOTE — Telephone Encounter (Signed)
Relation to [id number] Call back number:[phone]   Reason for call:  Patient requesting a refill dexmethylphenidate (FOCALIN) 10 MG tablet

## 2015-09-07 MED ORDER — DEXMETHYLPHENIDATE HCL 10 MG PO TABS: 10.0000 mg | ORAL_TABLET | Freq: Two times a day (BID) | ORAL | Status: DC

## 2015-09-07 NOTE — Telephone Encounter (Signed)
Last Rx:  07/16/15 Last UDS: 05/29/15 Pt is past due for fasting physical with Melissa.  Placed note on Rx to please scheduled CPE at time of Rx pick up.

## 2015-09-07 NOTE — Telephone Encounter (Signed)
Left detailed message on pt's cell # that Rx is ready for pick up and she will need to schedule CPE with Melissa.

## 2015-09-19 ENCOUNTER — Encounter: Payer: Self-pay | Admitting: Family

## 2015-09-19 ENCOUNTER — Ambulatory Visit (HOSPITAL_BASED_OUTPATIENT_CLINIC_OR_DEPARTMENT_OTHER)
Admission: RE | Admit: 2015-09-19 | Discharge: 2015-09-19 | Disposition: A | Discharge status: Home / Self Care | Payer: 59 | Source: Ambulatory Visit | Attending: Family | Admitting: Family

## 2015-09-19 ENCOUNTER — Ambulatory Visit (INDEPENDENT_AMBULATORY_CARE_PROVIDER_SITE_OTHER): Payer: 59 | Admitting: Family

## 2015-09-19 ENCOUNTER — Telehealth: Payer: Self-pay | Admitting: Family

## 2015-09-19 VITALS — BP 100/70 | HR 60 | Temp 97.7°F | Resp 16 | Ht 66.0 in | Wt 177.8 lb

## 2015-09-19 DIAGNOSIS — R319 Hematuria, unspecified: Secondary | ICD-10-CM | POA: Diagnosis not present

## 2015-09-19 DIAGNOSIS — M545 Low back pain: Secondary | ICD-10-CM | POA: Insufficient documentation

## 2015-09-19 DIAGNOSIS — R918 Other nonspecific abnormal finding of lung field: Secondary | ICD-10-CM | POA: Insufficient documentation

## 2015-09-19 DIAGNOSIS — R310 Gross hematuria: Secondary | ICD-10-CM | POA: Insufficient documentation

## 2015-09-19 DIAGNOSIS — R109 Unspecified abdominal pain: Secondary | ICD-10-CM | POA: Diagnosis not present

## 2015-09-19 LAB — POCT URINALYSIS DIPSTICK
BILIRUBIN UA: NEGATIVE
Glucose, UA: NEGATIVE
Ketones, UA: NEGATIVE
Leukocytes, UA: NEGATIVE
NITRITE UA: NEGATIVE
Protein, UA: NEGATIVE
Spec Grav, UA: >=1.03
Urobilinogen, UA: NEGATIVE
pH, UA: 5.5

## 2015-09-19 LAB — POCT URINE HCG BY VISUAL COLOR COMPARISON TESTS: Preg Test, Ur: NEGATIVE

## 2015-09-19 MED ORDER — FLUCONAZOLE 150 MG PO TABS
150.0000 mg | ORAL_TABLET | Freq: Once | ORAL | Status: DC
Start: 2015-09-19 — End: 2015-10-23

## 2015-09-19 MED ORDER — SULFAMETHOXAZOLE-TRIMETHOPRIM 800-160 MG PO TABS: 1.0000 | ORAL_TABLET | Freq: Two times a day (BID) | ORAL | Status: DC

## 2015-09-19 MED FILL — DEXMETHYLPHENIDATE 10 MG TA: 10 | 30 days supply | Qty: 60 | Fill #0

## 2015-09-19 MED FILL — FLUCONAZOLE 150 MG TABLET: 150 | 1 days supply | Qty: 1 | Fill #0

## 2015-09-19 MED FILL — SULFAMETHOXAZOLE/TMP DS TAB: 800-160 | 7 days supply | Qty: 14 | Fill #0

## 2015-09-19 NOTE — Progress Notes (Signed)
Pre visit review using our clinic review tool, if applicable. No additional management support is needed unless otherwise documented below in the visit note. 

## 2015-09-19 NOTE — Patient Instructions (Signed)
Please begin Bactrim. Complete CT scan on the first floor. Call if symptoms worsen, if symptoms do not improve or if you develop fever.

## 2015-09-19 NOTE — Telephone Encounter (Signed)
Notified pt and she voices understanding. Future lab order entered. 

## 2015-09-19 NOTE — Telephone Encounter (Signed)
Please let pt know that CT negative for stone. She may have passed a stone.  I would like to treat her UTI and have her come back in 2 weeks to repeat UA with micro to make sure that blood in urine has resolved.  CT does not a small pulmonary nodule, appears benign but we should plan to repeat CT chest in 6 months to ensure stability.

## 2015-09-19 NOTE — Progress Notes (Signed)
Subjective:    Patient ID: [name], female    DOB: 1971-04-24, 45 y.o.   MRN: [id number]  HPI  [name] is a 45 yr old female who presents today with chief complaint of hematuria.  She reports that she first developed hematuria on 09/17/15.  On 09/14/15 she experienced back pain.  Pain was located left of center, still has some mild left sided back pain.  + frequency, + urgency, + pelvic pressure. She took 1 tablet of cipro and 2 tablets of augmentin recently that she had on hand.     Review of Systems See HPI  Past Medical History  Diagnosis Date  . Asthma   . Allergy     allergic rhinitis  . ADD (attention deficit disorder)   . Adjustment disorder     without depression  . Thyroid disease     hyperthyroidism / Graves Disease - no treatment needed for years per patient  . SVD (spontaneous vaginal delivery)     x 1  . Former smoker   . Endometriosis     Social History   Social History  . Marital Status: Single    Spouse Name: N/A  . Number of Children: 1  . Years of Education: N/A   Occupational History  . RN    Social History Main Topics  . Smoking status: Former Smoker -- 0.50 packs/day for 15 years    Types: Cigarettes    Quit date: 07/19/2011  . Smokeless tobacco: Never Used  . Alcohol Use: Yes     Comment: socially  . Drug Use: No  . Sexual Activity: Yes    Birth Control/ Protection: Pill     Comment: on loestrin    Other Topics Concern  . Not on file   Social History Narrative    Past Surgical History  Procedure Laterality Date  . Right ankle surgery  2002  . Breast surgery  2004    augmentation  . Wisdom tooth extraction    . Laparoscopic tubal ligation Bilateral 12/21/2014    Procedure: LAPAROSCOPIC TUBAL LIGATION;  Surgeon: Dian Queen, MD;  Location: [facility];  Service: Gynecology;  Laterality: Bilateral;  . Dilitation & currettage/hystroscopy with hydrothermal ablation N/A 12/21/2014    Procedure: DILATATION & CURETTAGE/HYSTEROSCOPY  WITH HYDROTHERMAL ABLATION;  Surgeon: Dian Queen, MD;  Location: [facility];  Service: Gynecology;  Laterality: N/A;  . Tubal ligation  12/21/14    Family History  Problem Relation Age of Onset  . Diabetes Other   . Stroke Other   . Hypertension Other   . Thyroid disease Other   . Migraines Other     Allergies  Allergen Reactions  . Escitalopram Oxalate Other (See Comments)    Causes tremors.  . Montelukast Sodium Swelling    Causes swelling and pain in the joints.  . Wellbutrin [Bupropion] Other (See Comments)    Did well on a low dose but higher dose caused depression.  . Prednisone Anxiety and Other (See Comments)    Hot flashes, emotional upset    Current Outpatient Prescriptions on File Prior to Visit  Medication Sig Dispense Refill  . albuterol (PROAIR HFA) 108 (90 BASE) MCG/ACT inhaler Inhale 2 puffs into the lungs every 6 (six) hours. As needed and before exercise. 3.7 g 2  . cyclobenzaprine (FLEXERIL) 10 MG tablet Take 1 tablet (10 mg total) by mouth 3 (three) times daily as needed for muscle spasms. 60 tablet 0  . dexmethylphenidate (FOCALIN) 10 MG  tablet Take 1 tablet (10 mg total) by mouth 2 (two) times daily. 60 tablet 0  . fexofenadine (ALLEGRA) 180 MG tablet Take 180 mg by mouth daily as needed.     . fluticasone (FLONASE) 50 MCG/ACT nasal spray Place 2 sprays into both nostrils daily. 16 g 6  . Multiple Vitamin (MULTIVITAMIN) tablet Take 1 tablet by mouth daily.    . naproxen (NAPROSYN) 500 MG tablet Take one pill twice a day with food for 7 days then as needed (Patient taking differently: Take 500 mg by mouth 2 (two) times daily as needed for mild pain or moderate pain. ) 40 tablet 0  . oxyCODONE-acetaminophen (PERCOCET) 10-325 MG per tablet Take 1 tablet by mouth every 4 (four) hours as needed for pain. 30 tablet 0  . [DISCONTINUED] Norethindrone-Ethinyl Estradiol-Fe Biphas (LO LOESTRIN FE) 1 MG-10 MCG / 10 MCG tablet Take 1 tablet by mouth daily.       No  current facility-administered medications on file prior to visit.    BP 100/70 mmHg  Pulse 60  Temp(Src) 97.7 F (36.5 C) (Oral)  Resp 16  Ht 5\' 6"  (1.676 m)  Wt 177 lb 12.8 oz (80.65 kg)  BMI 28.71 kg/m2       Objective:   Physical Exam  Constitutional: She is oriented to person, place, and time. She appears well-developed and well-nourished.  HENT:  Head: Normocephalic and atraumatic.  Cardiovascular: Normal rate, regular rhythm and normal heart sounds.   No murmur heard. Pulmonary/Chest: Effort normal and breath sounds normal. No respiratory distress. She has no wheezes.  Abdominal: Soft.  + suprapubic tenderness.  + left sided abdominal pain. + L CVAT  Neurological: She is alert and oriented to person, place, and time.  Psychiatric: She has a normal mood and affect. Her behavior is normal. Judgment and thought content normal.          Assessment & Plan:  Hematuria- UA + for blood.  Will obtain culture.  Will initiate abx.  Send for CT scan to rule out Kidney stone.  Initiate bactrim. Pt is advised to call if symptoms worsen, if symptoms do not improve or if she develops fever.

## 2015-09-21 ENCOUNTER — Encounter: Payer: Self-pay | Admitting: Family

## 2015-09-21 LAB — URINE CULTURE
Colony Count: NO GROWTH
ORGANISM ID, BACTERIA: NO GROWTH

## 2015-09-21 NOTE — Telephone Encounter (Signed)
Pt called stating she could not access CT scan via mychart. Result released and above results discussed with pt and she voices understanding.

## 2015-10-18 ENCOUNTER — Telehealth: Payer: Self-pay | Admitting: Family

## 2015-10-18 NOTE — Telephone Encounter (Signed)
Pt is requesting refill on Focalin.  Last OV: 09/19/2015 Last Fill: 09/07/2015 #60 and 0RF   Please advise.

## 2015-10-19 MED ORDER — DEXMETHYLPHENIDATE HCL 10 MG PO TABS: 10.0000 mg | ORAL_TABLET | Freq: Two times a day (BID) | ORAL | Status: DC

## 2015-10-19 NOTE — Telephone Encounter (Signed)
See rx. 

## 2015-10-19 NOTE — Telephone Encounter (Signed)
Rx placed at front desk and pt notified. 

## 2015-10-23 ENCOUNTER — Ambulatory Visit (INDEPENDENT_AMBULATORY_CARE_PROVIDER_SITE_OTHER): Payer: 59 | Admitting: Family

## 2015-10-23 ENCOUNTER — Encounter: Payer: Self-pay | Admitting: Family

## 2015-10-23 VITALS — BP 102/68 | HR 55 | Temp 97.8°F | Ht 66.0 in | Wt 178.0 lb

## 2015-10-23 DIAGNOSIS — J0100 Acute maxillary sinusitis, unspecified: Secondary | ICD-10-CM | POA: Diagnosis not present

## 2015-10-23 DIAGNOSIS — Z23 Encounter for immunization: Secondary | ICD-10-CM

## 2015-10-23 DIAGNOSIS — Z1211 Encounter for screening for malignant neoplasm of colon: Secondary | ICD-10-CM | POA: Insufficient documentation

## 2015-10-23 DIAGNOSIS — Z0001 Encounter for general adult medical examination with abnormal findings: Secondary | ICD-10-CM

## 2015-10-23 DIAGNOSIS — Z0000 Encounter for general adult medical examination without abnormal findings: Secondary | ICD-10-CM | POA: Diagnosis not present

## 2015-10-23 LAB — HEPATIC FUNCTION PANEL
ALT: 15 U/L (ref 0–35)
AST: 20 U/L (ref 0–37)
Albumin: 4.3 g/dL (ref 3.5–5.2)
Alkaline Phosphatase: 49 U/L (ref 39–117)
BILIRUBIN DIRECT: 0.1 mg/dL (ref 0.0–0.3)
BILIRUBIN TOTAL: 0.6 mg/dL (ref 0.2–1.2)
Total Protein: 6.8 g/dL (ref 6.0–8.3)

## 2015-10-23 LAB — BASIC METABOLIC PANEL
BUN: 21 mg/dL (ref 6–23)
CHLORIDE: 106 meq/L (ref 96–112)
CO2: 27 mEq/L (ref 19–32)
Calcium: 9.4 mg/dL (ref 8.4–10.5)
Creatinine, Ser: 0.83 mg/dL (ref 0.40–1.20)
GFR: 79.59 mL/min (ref >60.00)
GLUCOSE: 92 mg/dL (ref 70–99)
Potassium: 4.2 mEq/L (ref 3.5–5.1)
Sodium: 140 mEq/L (ref 135–145)

## 2015-10-23 LAB — CBC WITH DIFFERENTIAL/PLATELET
BASOS PCT: 1 % (ref 0.0–3.0)
Basophils Absolute: 0 10*3/uL (ref 0.0–0.1)
EOS ABS: 0.1 10*3/uL (ref 0.0–0.7)
EOS PCT: 2.8 % (ref 0.0–5.0)
HCT: 39.2 % (ref 36.0–46.0)
Hemoglobin: 13.3 g/dL (ref 12.0–15.0)
LYMPHS ABS: 1.2 10*3/uL (ref 0.7–4.0)
Lymphocytes Relative: 35.8 % (ref 12.0–46.0)
MCHC: 34 g/dL (ref 30.0–36.0)
MCV: 93.3 fl (ref 78.0–100.0)
MONO ABS: 0.3 10*3/uL (ref 0.1–1.0)
Monocytes Relative: 9.9 % (ref 3.0–12.0)
NEUTROS PCT: 50.5 % (ref 43.0–77.0)
Neutro Abs: 1.8 10*3/uL (ref 1.4–7.7)
PLATELETS: 191 10*3/uL (ref 150.0–400.0)
RBC: 4.2 Mil/uL (ref 3.87–5.11)
RDW: 13.3 % (ref 11.5–15.5)
WBC: 3.5 10*3/uL — ABNORMAL LOW (ref 4.0–10.5)

## 2015-10-23 LAB — URINALYSIS, ROUTINE W REFLEX MICROSCOPIC
BILIRUBIN URINE: NEGATIVE
HGB URINE DIPSTICK: NEGATIVE
Ketones, ur: NEGATIVE
Leukocytes, UA: NEGATIVE
Nitrite: NEGATIVE
PH: 6.5 (ref 5.0–8.0)
RBC / HPF: NONE SEEN (ref 0–?)
Specific Gravity, Urine: 1.02 (ref 1.000–1.030)
Total Protein, Urine: NEGATIVE
Urine Glucose: NEGATIVE
Urobilinogen, UA: 0.2 (ref 0.0–1.0)
WBC UA: NONE SEEN (ref 0–?)

## 2015-10-23 LAB — LIPID PANEL
CHOLESTEROL: 121 mg/dL (ref 0–200)
HDL: 62.4 mg/dL (ref >39.00)
LDL Cholesterol: 48 mg/dL (ref 0–99)
NonHDL: 58.54
TRIGLYCERIDES: 55 mg/dL (ref 0.0–149.0)
Total CHOL/HDL Ratio: 2
VLDL: 11 mg/dL (ref 0.0–40.0)

## 2015-10-23 LAB — TSH: TSH: 1.86 u[IU]/mL (ref 0.35–4.50)

## 2015-10-23 MED ORDER — FLUCONAZOLE 150 MG PO TABS: 150.0000 mg | ORAL_TABLET | Freq: Once | ORAL | Status: DC

## 2015-10-23 MED ORDER — ALBUTEROL SULFATE HFA 108 (90 BASE) MCG/ACT IN AERS: 2.0000 | INHALATION_SPRAY | Freq: Four times a day (QID) | RESPIRATORY_TRACT | Status: DC

## 2015-10-23 MED ORDER — AMOXICILLIN-POT CLAVULANATE 875-125 MG PO TABS: 1.0000 | ORAL_TABLET | Freq: Two times a day (BID) | ORAL | Status: DC

## 2015-10-23 MED FILL — FLUCONAZOLE 150 MG TABLET: 150 | 1 days supply | Qty: 1 | Fill #0

## 2015-10-23 MED FILL — DEXMETHYLPHENIDATE 10 MG TA: 10 | 30 days supply | Qty: 60 | Fill #0

## 2015-10-23 MED FILL — VENTOLIN HFA 90 MCG INHALER: 108 (90 BAS | 30 days supply | Qty: 18 | Fill #0

## 2015-10-23 MED FILL — AMOX-CLAV 875-125 MG TABLET: 875-125 | 10 days supply | Qty: 20 | Fill #0

## 2015-10-23 NOTE — Addendum Note (Signed)
Addended by: Damita Dunnings D on: 10/23/2015 08:47 AM   Modules accepted: Orders, Medications

## 2015-10-23 NOTE — Assessment & Plan Note (Signed)
Discussed healthy diet, exercise weight loss. Obtain routine labs.  Pap/mammo per GYN.

## 2015-10-23 NOTE — Progress Notes (Signed)
Pre visit review using our clinic review tool, if applicable. No additional management support is needed unless otherwise documented below in the visit note. 

## 2015-10-23 NOTE — Progress Notes (Signed)
Subjective:    Patient ID: [name], female    DOB: 1971/05/25, 45 y.o.   MRN: [id number]  HPI  Patient presents today for complete physical.  Immunizations: due for tdap-  Diet: she is limiting her carbs.   Exercise: regular exercise Pap Smear: per GYN has upcoming apt on 3/30 Mammogram: due 5/17  Sinus- reports 3 week hx of sinus pain/pressure/drainage.  Using mucinex which helps some.    Review of Systems  Constitutional: Negative for fever, fatigue and unexpected weight change.  HENT: Positive for rhinorrhea, sinus pressure and tinnitus. Negative for hearing loss and voice change.   Eyes: Negative for pain and visual disturbance.  Respiratory:       Some wheezing with exercise- uses albuterol  Cardiovascular: Negative for palpitations and leg swelling.  Gastrointestinal: Negative for nausea, diarrhea and constipation.       GI but last week, now resolved  Genitourinary: Negative for urgency and frequency.  Musculoskeletal: Negative for myalgias and arthralgias.  Skin: Negative for rash.  Neurological: Negative for headaches.  Hematological: Negative for adenopathy.  Psychiatric/Behavioral:       Denies anxiety and depression, focus is good.    Past Medical History  Diagnosis Date  . Asthma   . Allergy     allergic rhinitis  . ADD (attention deficit disorder)   . Adjustment disorder     without depression  . Thyroid disease     hyperthyroidism / Graves Disease - no treatment needed for years per patient  . SVD (spontaneous vaginal delivery)     x 1  . Former smoker   . Endometriosis     Social History   Social History  . Marital Status: Single    Spouse Name: N/A  . Number of Children: 1  . Years of Education: N/A   Occupational History  . RN    Social History Main Topics  . Smoking status: Former Smoker -- 0.50 packs/day for 15 years    Types: Cigarettes    Quit date: 07/19/2011  . Smokeless tobacco: Never Used  . Alcohol Use: Yes   Comment: socially  . Drug Use: No  . Sexual Activity: Yes    Birth Control/ Protection: Pill     Comment: on loestrin    Other Topics Concern  . Not on file   Social History Narrative    Past Surgical History  Procedure Laterality Date  . Right ankle surgery  2002  . Breast surgery  2004    augmentation  . Wisdom tooth extraction    . Laparoscopic tubal ligation Bilateral 12/21/2014    Procedure: LAPAROSCOPIC TUBAL LIGATION;  Surgeon: Dian Queen, MD;  Location: [facility];  Service: Gynecology;  Laterality: Bilateral;  . Dilitation & currettage/hystroscopy with hydrothermal ablation N/A 12/21/2014    Procedure: DILATATION & CURETTAGE/HYSTEROSCOPY WITH HYDROTHERMAL ABLATION;  Surgeon: Dian Queen, MD;  Location: [facility];  Service: Gynecology;  Laterality: N/A;  . Tubal ligation  12/21/14    Family History  Problem Relation Age of Onset  . Diabetes Other   . Stroke Other   . Hypertension Other   . Thyroid disease Other   . Migraines Other     Allergies  Allergen Reactions  . Escitalopram Oxalate Other (See Comments)    Causes tremors.  . Montelukast Sodium Swelling    Causes swelling and pain in the joints.  . Wellbutrin [Bupropion] Other (See Comments)    Did well on a low dose but higher dose  caused depression.  . Prednisone Anxiety and Other (See Comments)    Hot flashes, emotional upset    Current Outpatient Prescriptions on File Prior to Visit  Medication Sig Dispense Refill  . dexmethylphenidate (FOCALIN) 10 MG tablet Take 1 tablet (10 mg total) by mouth 2 (two) times daily. 60 tablet 0  . fexofenadine (ALLEGRA) 180 MG tablet Take 180 mg by mouth daily as needed.     . fluticasone (FLONASE) 50 MCG/ACT nasal spray Place 2 sprays into both nostrils daily. 16 g 6  . Multiple Vitamin (MULTIVITAMIN) tablet Take 1 tablet by mouth daily.    . [DISCONTINUED] Norethindrone-Ethinyl Estradiol-Fe Biphas (LO LOESTRIN FE) 1 MG-10 MCG / 10 MCG tablet Take 1 tablet by mouth  daily.       No current facility-administered medications on file prior to visit.    BP 102/68 mmHg  Pulse 55  Temp(Src) 97.8 F (36.6 C) (Oral)  Ht 5\' 6"  (1.676 m)  Wt 178 lb (80.74 kg)  BMI 28.74 kg/m2  SpO2 99%       Objective:   Physical Exam  Physical Exam  Constitutional: She is oriented to person, place, and time. She appears well-developed and well-nourished. No distress.  HENT:  Head: Normocephalic and atraumatic.  Right Ear: Tympanic membrane and ear canal normal.  Left Ear: Tympanic membrane and ear canal normal.  Mouth/Throat: Oropharynx is clear and moist. + maxillary sinus tenderness to palpation Eyes: Pupils are equal, round, and reactive to light. No scleral icterus.  Neck: Normal range of motion. No thyromegaly present.  Cardiovascular: Normal rate and regular rhythm.   No murmur heard. Pulmonary/Chest: Effort normal and breath sounds normal. No respiratory distress. He has no wheezes. She has no rales. She exhibits no tenderness.  Abdominal: Soft. Bowel sounds are normal. He exhibits no distension and no mass. There is no tenderness. There is no rebound and no guarding.  Musculoskeletal: She exhibits no edema.  Lymphadenopathy:    She has no cervical adenopathy.  Neurological: She is alert and oriented to person, place, and time. She has normal patellar reflexes. She exhibits normal muscle tone. Coordination normal.  Skin: Skin is warm and dry.  Psychiatric: She has a normal mood and affect. Her behavior is normal. Judgment and thought content normal.  Breast/pelvic: deferred          Assessment & Plan:         Assessment & Plan:  Sinusitis- rx with augmentin, pt requests rx or diflucan as needed for yeast. Rx sent. She is advised to call if symptoms worsen or if symptoms do not improve.

## 2015-10-23 NOTE — Patient Instructions (Signed)
Please complete lab work prior to leaving. Start augmentin for sinus infection.  Call if your sinus symptoms worsen or if symptoms do not improve.

## 2015-10-26 ENCOUNTER — Encounter: Payer: Self-pay | Admitting: Emergency Medicine

## 2015-10-26 ENCOUNTER — Other Ambulatory Visit: Payer: Self-pay

## 2015-10-26 ENCOUNTER — Ambulatory Visit (INDEPENDENT_AMBULATORY_CARE_PROVIDER_SITE_OTHER): Payer: 59 | Admitting: Emergency Medicine

## 2015-10-26 VITALS — BP 106/70 | HR 62 | Ht 66.0 in | Wt 180.0 lb

## 2015-10-26 DIAGNOSIS — J309 Allergic rhinitis, unspecified: Secondary | ICD-10-CM | POA: Diagnosis not present

## 2015-10-26 DIAGNOSIS — R911 Solitary pulmonary nodule: Secondary | ICD-10-CM

## 2015-10-26 DIAGNOSIS — J45909 Unspecified asthma, uncomplicated: Secondary | ICD-10-CM

## 2015-10-26 DIAGNOSIS — R918 Other nonspecific abnormal finding of lung field: Secondary | ICD-10-CM | POA: Insufficient documentation

## 2015-10-26 DIAGNOSIS — D485 Neoplasm of uncertain behavior of skin: Secondary | ICD-10-CM | POA: Diagnosis not present

## 2015-10-26 NOTE — Addendum Note (Signed)
Addended by: Desmond Dike C on: 10/26/2015 10:11 AM   Modules accepted: Orders

## 2015-10-26 NOTE — Patient Instructions (Signed)
Please continue your albuterol 2 puffs as needed for shortness of breath Keep track of your frequency of usage Continue your allegra and fluticasone nasal spray  We will perform a CT scan of the chest with contrast to evaluate pulmonary nodules.  We will perform pulmonary function testing  Follow with Dr Lamonte Sakai next available after the testing to review.

## 2015-10-26 NOTE — Assessment & Plan Note (Signed)
History of asthma currently managed with albuterol when necessary. She is symptomatic with most exercise. Last her to keep track of her albuterol use. Depending on how frequently she uses it we may need to add scheduled medication, ICS. We will perform full pulmonary function testing to assess her degree of obstruction

## 2015-10-26 NOTE — Progress Notes (Signed)
Subjective:    Patient ID: [name], female    DOB: 03/07/1971, 45 y.o.   MRN: [id number]  HPI 45 year old woman, former smoker (20 pk-yrs), carries a history of asthma and allergic rhinitis. She uses Allegra as needed, fluticasone nasal spray daily and albuterol available to use when necessary. Abdominal CT to evaluate hematuria on 09/19/15 showed multiple splenic granulomas and calcifications. There were no renal calculi or renal abnormalities addendum 5. There was a 6.5 mm right lower lobe nodule that was noncalcified.  She has dyspnea and wheeze with exertion. She is a runner, but has had to modify her exercise dur to these sx.     Review of Systems  Constitutional: Negative for fever and unexpected weight change.  HENT: Negative for congestion, dental problem, ear pain, nosebleeds, postnasal drip, rhinorrhea, sinus pressure, sneezing, sore throat and trouble swallowing.   Eyes: Negative for redness and itching.  Respiratory: Positive for cough, chest tightness, shortness of breath and wheezing.   Cardiovascular: Negative for palpitations and leg swelling.  Gastrointestinal: Negative for nausea and vomiting.  Genitourinary: Negative for dysuria.  Musculoskeletal: Negative for joint swelling.  Skin: Negative for rash.  Neurological: Negative for headaches.  Hematological: Does not bruise/bleed easily.  Psychiatric/Behavioral: Negative for dysphoric mood. The patient is not nervous/anxious.      Past Medical History  Diagnosis Date  . Asthma   . Allergy     allergic rhinitis  . ADD (attention deficit disorder)   . Adjustment disorder     without depression  . Thyroid disease     hyperthyroidism / Graves Disease - no treatment needed for years per patient  . SVD (spontaneous vaginal delivery)     x 1  . Former smoker   . Endometriosis      Family History  Problem Relation Age of Onset  . Diabetes Other   . Stroke Other   . Hypertension Other   . Thyroid disease  Other   . Migraines Other      Social History   Social History  . Marital Status: Single    Spouse Name: N/A  . Number of Children: 1  . Years of Education: N/A   Occupational History  . RN    Social History Main Topics  . Smoking status: Former Smoker -- 0.50 packs/day for 15 years    Types: Cigarettes    Quit date: 07/19/2011  . Smokeless tobacco: Never Used  . Alcohol Use: Yes     Comment: socially  . Drug Use: No  . Sexual Activity: Yes    Birth Control/ Protection: Pill     Comment: on loestrin    Other Topics Concern  . Not on file   Social History Narrative  She has worked in [place] exposure Has lived in [place] her entire life.  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    Allergies  Allergen Reactions  . Escitalopram Oxalate Other (See Comments)    Causes tremors.  . Montelukast Sodium Swelling    Causes swelling and pain in the joints.  . Wellbutrin [Bupropion] Other (See Comments)    Did well on a low dose but higher dose caused depression.  . Prednisone Anxiety and Other (See Comments)    Hot flashes, emotional upset     Outpatient Prescriptions Prior to Visit  Medication Sig Dispense Refill  . albuterol (PROAIR HFA) 108 (90 Base) MCG/ACT inhaler Inhale 2 puffs into the lungs every 6 (six) hours. As needed and  before exercise. 3.7 g 5  . amoxicillin-clavulanate (AUGMENTIN) 875-125 MG tablet Take 1 tablet by mouth 2 (two) times daily. 20 tablet 0  . dexmethylphenidate (FOCALIN) 10 MG tablet Take 1 tablet (10 mg total) by mouth 2 (two) times daily. 60 tablet 0  . fexofenadine (ALLEGRA) 180 MG tablet Take 180 mg by mouth daily as needed.     . fluticasone (FLONASE) 50 MCG/ACT nasal spray Place 2 sprays into both nostrils daily. 16 g 6  . Multiple Vitamin (MULTIVITAMIN) tablet Take 1 tablet by mouth daily.    . fluconazole (DIFLUCAN) 150 MG tablet Take 1 tablet (150 mg total) by mouth once. 1 tablet 0   No facility-administered medications  prior to visit.         Objective:   Physical Exam Filed Vitals:   10/26/15 0934  BP: 106/70  Pulse: 62  Height: 5\' 6"  (1.676 m)  Weight: 180 lb (81.647 kg)  SpO2: 98%   Gen: Pleasant, well-nourished, in no distress,  normal affect  ENT: No lesions,  mouth clear,  oropharynx clear, no postnasal drip  Neck: No JVD, no TMG, no carotid bruits  Lungs: No use of accessory muscles, clear without rales or rhonchi  Cardiovascular: RRR, heart sounds normal, no murmur or gallops, no peripheral edema  Musculoskeletal: No deformities, no cyanosis or clubbing  Neuro: alert, non focal  Skin: Warm, no lesions or rashes      Assessment & Plan:  Solitary pulmonary nodule 6.5 mm right lower lobe nodule noted on CT scan of her abdomen. This is non-spiculated and is most consistent with granuloma. We will follow it serially. I would like to perform a dedicated CT scan of her chest to characterize the apices, ensure that there are no further nodules to follow. She has a 20-pack-year tobacco history and is at moderately increased risk so I believe we'll need to follow her first repeat scan in 6 months.  Asthma History of asthma currently managed with albuterol when necessary. She is symptomatic with most exercise. Last her to keep track of her albuterol use. Depending on how frequently she uses it we may need to add scheduled medication, ICS. We will perform full pulmonary function testing to assess her degree of obstruction  Allergic rhinitis She is having active allergy symptoms. I believe she needs to continue her Allegra and fluticasone nasal spray.

## 2015-10-26 NOTE — Addendum Note (Signed)
Addended by: Desmond Dike C on: 10/26/2015 10:09 AM   Modules accepted: Orders

## 2015-10-26 NOTE — Assessment & Plan Note (Signed)
6.5 mm right lower lobe nodule noted on CT scan of her abdomen. This is non-spiculated and is most consistent with granuloma. We will follow it serially. I would like to perform a dedicated CT scan of her chest to characterize the apices, ensure that there are no further nodules to follow. She has a 20-pack-year tobacco history and is at moderately increased risk so I believe we'll need to follow her first repeat scan in 6 months.

## 2015-10-26 NOTE — Assessment & Plan Note (Signed)
She is having active allergy symptoms. I believe she needs to continue her Allegra and fluticasone nasal spray.

## 2015-10-29 ENCOUNTER — Ambulatory Visit (HOSPITAL_COMMUNITY)
Admission: RE | Admit: 2015-10-29 | Discharge: 2015-10-29 | Disposition: A | Discharge status: Home / Self Care | Payer: 59 | Source: Ambulatory Visit | Attending: Emergency Medicine | Admitting: Emergency Medicine

## 2015-10-29 DIAGNOSIS — J45909 Unspecified asthma, uncomplicated: Secondary | ICD-10-CM | POA: Insufficient documentation

## 2015-10-29 LAB — PULMONARY FUNCTION TEST
DL/VA % PRED: 93 %
DL/VA: 4.7 ml/min/mmHg/L
DLCO UNC: 24.39 ml/min/mmHg
DLCO unc % pred: 90 %
FEF 25-75 POST: 1.67 L/s
FEF 25-75 Pre: 2.89 L/sec
FEF2575-%Change-Post: -42 %
FEF2575-%Pred-Post: 53 %
FEF2575-%Pred-Pre: 92 %
FEV1-%Change-Post: -14 %
FEV1-%PRED-PRE: 93 %
FEV1-%Pred-Post: 80 %
FEV1-Post: 2.51 L
FEV1-Pre: 2.93 L
FEV1FVC-%Change-Post: -9 %
FEV1FVC-%Pred-Pre: 95 %
FEV6-%CHANGE-POST: -9 %
FEV6-%PRED-PRE: 97 %
FEV6-%Pred-Post: 89 %
FEV6-POST: 3.4 L
FEV6-Pre: 3.74 L
FEV6FVC-%PRED-POST: 102 %
FEV6FVC-%Pred-Pre: 102 %
FVC-%Change-Post: -4 %
FVC-%PRED-PRE: 95 %
FVC-%Pred-Post: 91 %
FVC-Post: 3.56 L
FVC-Pre: 3.74 L
Post FEV1/FVC ratio: 71 %
Post FEV6/FVC ratio: 100 %
Pre FEV1/FVC ratio: 78 %
Pre FEV6/FVC Ratio: 100 %
RV % pred: 102 %
RV: 1.8 L
TLC % PRED: 107 %
TLC: 5.74 L

## 2015-10-29 MED ORDER — ALBUTEROL SULFATE (2.5 MG/3ML) 0.083% IN NEBU
2.5000 mg | INHALATION_SOLUTION | Freq: Once | RESPIRATORY_TRACT | Status: AC
  Administered 2015-10-29: 2.5 mg via RESPIRATORY_TRACT

## 2015-11-01 ENCOUNTER — Ambulatory Visit (INDEPENDENT_AMBULATORY_CARE_PROVIDER_SITE_OTHER)
Admission: RE | Admit: 2015-11-01 | Discharge: 2015-11-01 | Disposition: A | Discharge status: Home / Self Care | Payer: 59 | Source: Ambulatory Visit | Attending: Emergency Medicine | Admitting: Emergency Medicine

## 2015-11-01 DIAGNOSIS — R911 Solitary pulmonary nodule: Secondary | ICD-10-CM

## 2015-11-01 DIAGNOSIS — R918 Other nonspecific abnormal finding of lung field: Secondary | ICD-10-CM | POA: Diagnosis not present

## 2015-11-01 MED ORDER — IOHEXOL 300 MG/ML  SOLN
80.0000 mL | Freq: Once | INTRAMUSCULAR | Status: AC | PRN
  Administered 2015-11-01: 80 mL via INTRAVENOUS

## 2015-11-10 IMAGING — MG STEREO BIOPSY, LLM
8 of 9 series · 8 of 9 positions shown · non-contrast
Comparison: Previous exams.

ADDENDUM:
Pathology reveals Right breast fibrocystic changes with associated
calcifications. Pathology reveals Left breast fibrocystic changes
with usual ductal hyperplasia and associated calcifications and
pseudoangiomatous stromal hyperplasia. This was found to be
concordant by Dr. Emi L Revelli. Pathology results were discussed
with the patient via telephone. The patient reported tenderness at
the biopsy sites. Post biopsy instructions were reviewed and
questions were answered. The patient was encouraged to call The
[REDACTED] with any additional questions
and or concerns. The patient was instructed to continue with annual
screening mammograms.

Pathology results reported by Schendler Melene RN on January 16, 2015.
CLINICAL DATA: Calcifications in bilateral breasts for biopsy.
EXAM:
Bilateral BREAST STEREOTACTIC CORE NEEDLE BIOPSY

[L LM (1 of 8)]
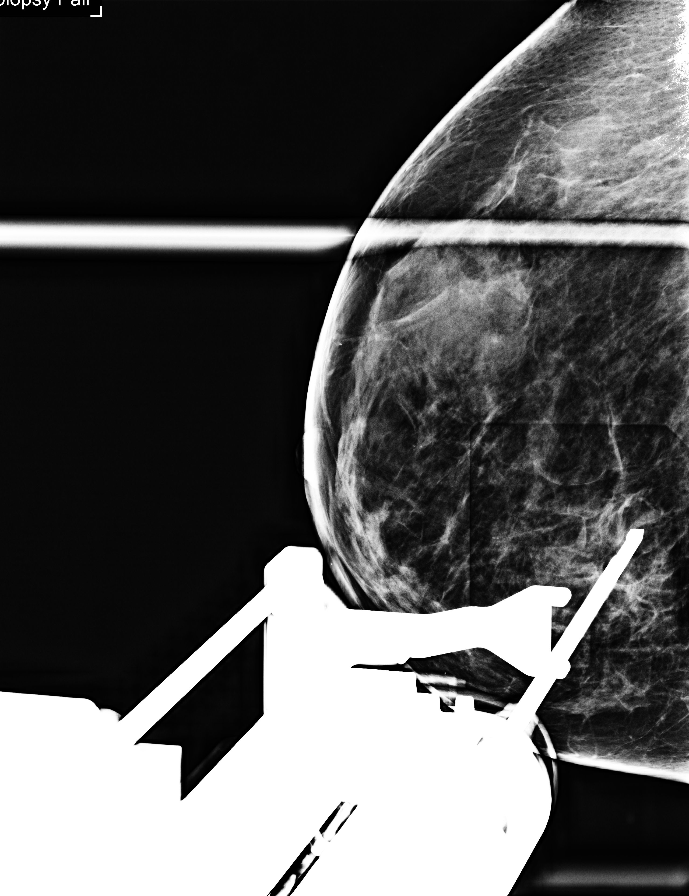

[L LM (2 of 8)]
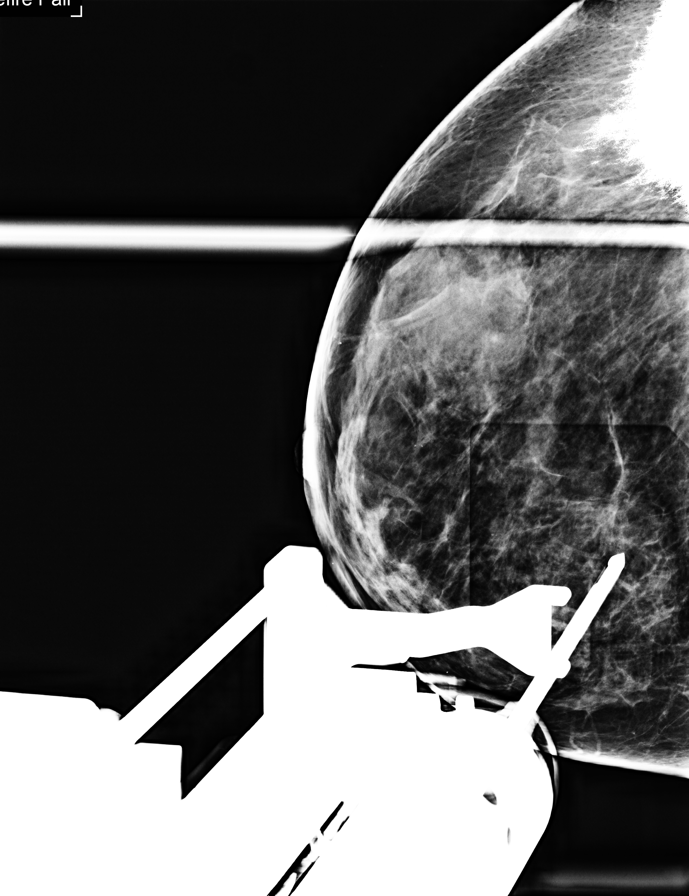

[L LM (3 of 8)]
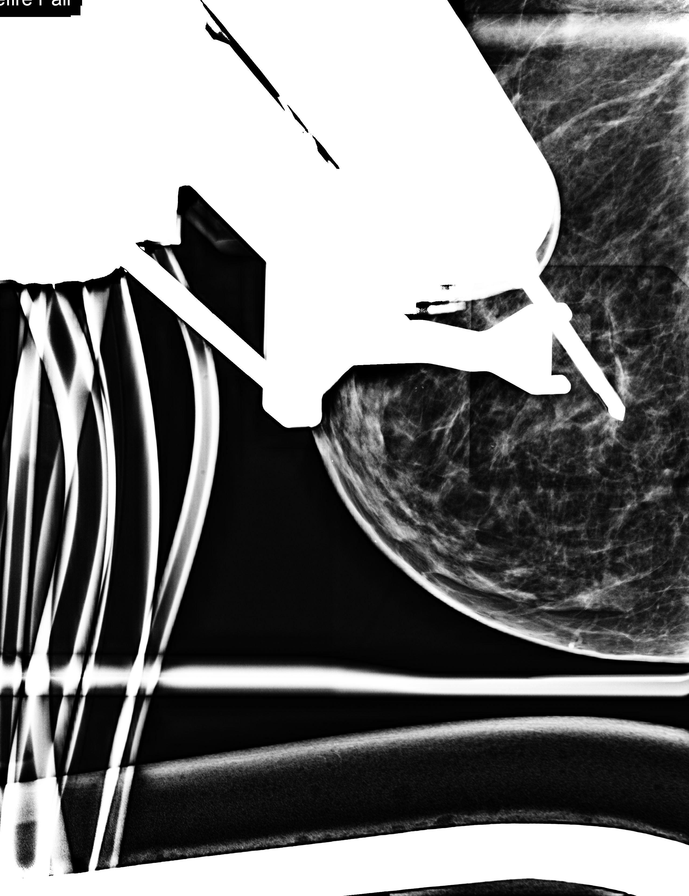

[L LM (4 of 8)]
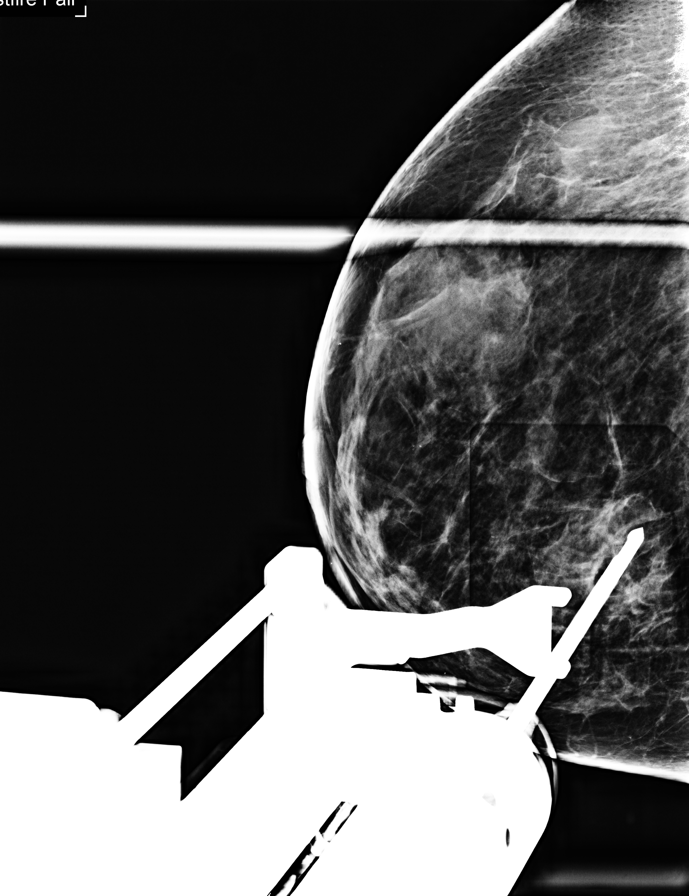

[L LM (5 of 8)]
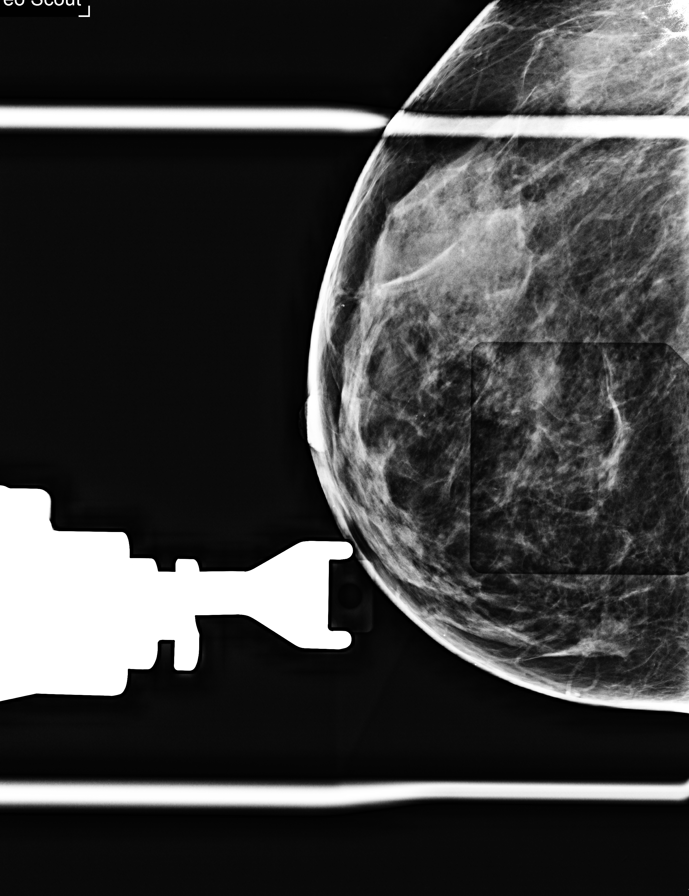

[L LM (6 of 8)]
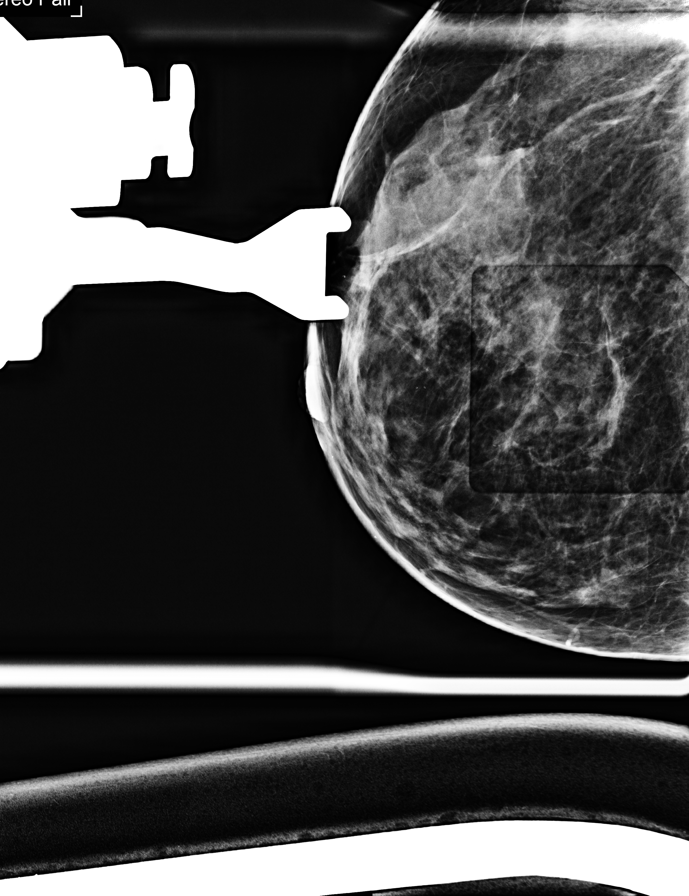

[L LM (7 of 8)]
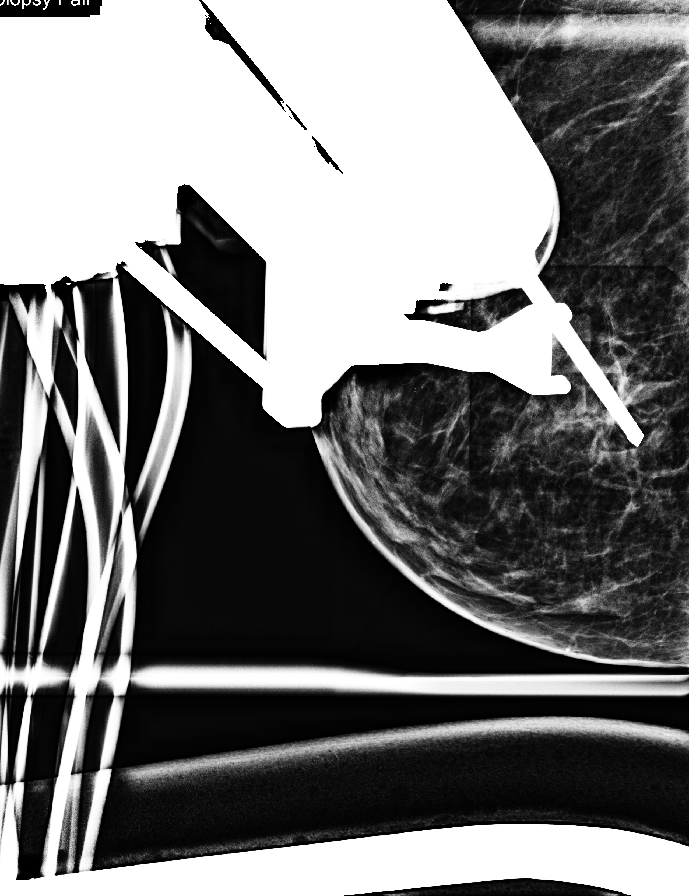

[L LM (8 of 8)]
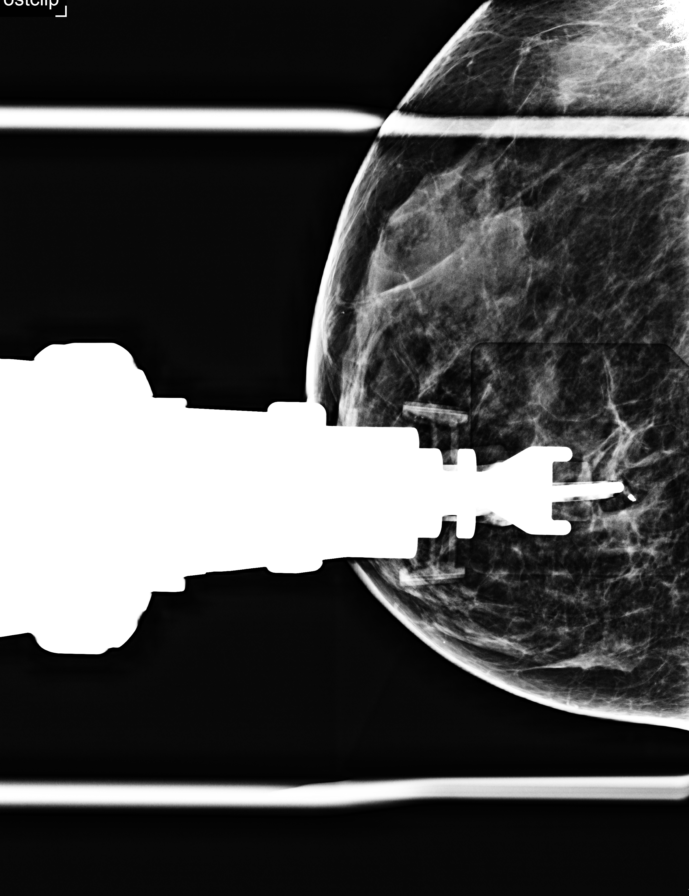

[8 of 9 positions shown; findings below may reference images not displayed]



Using sterile technique and 1% Lidocaine as local anesthetic, under
stereotactic guidance, a 9 gauge stereotactic vacuum assisted device
was used to perform core needle biopsy of calcifications in the
lateral slightly lower right breast using a lateral approach.
Specimen radiograph was performed showing inclusion of
calcifications of concern. Specimens with calcifications are
identified for pathology and the specimen was labeled right breast
#1, 8 o'clock.

Using sterile technique and 1% Lidocaine as local anesthetic, under
stereotactic guidance, a 9 gauge stereotactic vacuum assisted device
was used to perform core needle biopsy of calcifications in the
lateral slight lower left breast using a lateral approach. Specimen
radiograph was performed showing inclusion a calcifications of
concern. Specimens with calcifications are identified for pathology
and the specimen was labral left breast #2, 4 o'clock.

At the conclusion of the procedure, a coil tissue marker clip was
deployed into the biopsy cavity in the each breast. Follow-up 2-view
mammogram was performed and dictated separately.
IMPRESSION: Stereotactic-guided biopsy of bilateral breasts. No apparent
complications.

## 2015-11-15 DIAGNOSIS — Z6827 Body mass index (BMI) 27.0-27.9, adult: Secondary | ICD-10-CM | POA: Diagnosis not present

## 2015-11-15 DIAGNOSIS — Z01419 Encounter for gynecological examination (general) (routine) without abnormal findings: Secondary | ICD-10-CM | POA: Diagnosis not present

## 2015-11-27 ENCOUNTER — Telehealth: Payer: Self-pay | Admitting: Family

## 2015-11-27 MED ORDER — DEXMETHYLPHENIDATE HCL 10 MG PO TABS: 10.0000 mg | ORAL_TABLET | Freq: Two times a day (BID) | ORAL | Status: DC

## 2015-11-27 NOTE — Telephone Encounter (Signed)
Pt is requesting refill on Focalin. [facility] Pt.  Last OV: 10/23/2015 Last Fill: 10/19/2015 #60 and 0RF UDS: 06/14/2014 Moderate risk  Please advise.

## 2015-11-27 NOTE — Telephone Encounter (Signed)
Rx placed at front desk for pick up at Pt's convenience. Pt informed via MyChart.  

## 2015-11-27 NOTE — Telephone Encounter (Signed)
Rx printed, awaiting PA signature.  

## 2015-11-27 NOTE — Telephone Encounter (Signed)
Ok to print 30-day supply in [name]'s absence. Needs UDS.

## 2015-12-05 DIAGNOSIS — Z79899 Other long term (current) drug therapy: Secondary | ICD-10-CM | POA: Diagnosis not present

## 2015-12-05 MED FILL — DEXMETHYLPHENIDATE 10 MG TA: 10 | 30 days supply | Qty: 60 | Fill #0

## 2015-12-07 ENCOUNTER — Ambulatory Visit (INDEPENDENT_AMBULATORY_CARE_PROVIDER_SITE_OTHER): Payer: 59 | Admitting: Emergency Medicine

## 2015-12-07 ENCOUNTER — Encounter: Payer: Self-pay | Admitting: Emergency Medicine

## 2015-12-07 VITALS — BP 98/66 | HR 60 | Ht 66.0 in | Wt 174.0 lb

## 2015-12-07 DIAGNOSIS — R911 Solitary pulmonary nodule: Secondary | ICD-10-CM | POA: Diagnosis not present

## 2015-12-07 DIAGNOSIS — R918 Other nonspecific abnormal finding of lung field: Secondary | ICD-10-CM | POA: Diagnosis not present

## 2015-12-07 DIAGNOSIS — J309 Allergic rhinitis, unspecified: Secondary | ICD-10-CM | POA: Diagnosis not present

## 2015-12-07 DIAGNOSIS — J45909 Unspecified asthma, uncomplicated: Secondary | ICD-10-CM

## 2015-12-07 NOTE — Patient Instructions (Addendum)
Keep albuterol available to use as needed for shortness of breath or before exercise.  Continue your allegra and flonase spray Try chlorpheniramine 4mg  up to every 6 hours prn for drainage and cough We will repeat your Ct scan of the chest in 1 year, no contrast.  Follow with Dr Lamonte Sakai in 12 months or sooner if you have any problems

## 2015-12-07 NOTE — Assessment & Plan Note (Signed)
Small right lower lobe and left lower lobe nodules, rounded and groundglass, peripheral all consistent  with possible benign etiology. We will follow her CT scan in one year.

## 2015-12-07 NOTE — Assessment & Plan Note (Signed)
Discussed potentially adding chlorpheniramine to her Flonase and Allegra.

## 2015-12-07 NOTE — Assessment & Plan Note (Signed)
Mild obstruction noted on her postbronchodilator trials on primary function testing from 10/29/15. I have asked her to Continue to use albuterol when necessary, make an assessment regarding benefit versus worsening in her bronchospasm.

## 2015-12-07 NOTE — Progress Notes (Signed)
Subjective:    Patient ID: [name], female    DOB: 1971-05-27, 45 y.o.   MRN: [id number]  HPI 45 year old woman, former smoker (20 pk-yrs), carries a history of asthma and allergic rhinitis. She uses Allegra as needed, fluticasone nasal spray daily and albuterol available to use when necessary. Abdominal CT to evaluate hematuria on 09/19/15 showed multiple splenic granulomas and calcifications. There were no renal calculi or renal abnormalities addendum 5. There was a 6.5 mm right lower lobe nodule that was noncalcified.  She has dyspnea and wheeze with exertion. She is a runner, but has had to modify her exercise dur to these sx.   ROV 12/07/15 -- follow-up visit for history of asthma, allergies, former tobacco and pulmonary nodules were noted on CT scan of the abdomen in February '17. She had a CT scan of the chest on 11/01/15 that I personally reviewed. This shows 2 pulmonary nodules, a 7 x 5 mm groundglass nodule in the right lower lobe, and an 8 x 4 mm subpleural nodule in the peripheral left lower lobe. These are unchanged compared with abdominal CT.  She is on flonase and allegra. PFT from 10/29/15 reviewed by me > mild to moderate obstruction noted after albuterol was given.     Review of Systems  Constitutional: Negative for fever and unexpected weight change.  HENT: Negative for congestion, dental problem, ear pain, nosebleeds, postnasal drip, rhinorrhea, sinus pressure, sneezing, sore throat and trouble swallowing.   Eyes: Negative for redness and itching.  Respiratory: Positive for cough, chest tightness, shortness of breath and wheezing.   Cardiovascular: Negative for palpitations and leg swelling.  Gastrointestinal: Negative for nausea and vomiting.  Genitourinary: Negative for dysuria.  Musculoskeletal: Negative for joint swelling.  Skin: Negative for rash.  Neurological: Negative for headaches.  Hematological: Does not bruise/bleed easily.  Psychiatric/Behavioral: Negative  for dysphoric mood. The patient is not nervous/anxious.         Objective:   Physical Exam Filed Vitals:   12/07/15 1020  BP: 98/66  Pulse: 60  Height: 5\' 6"  (1.676 m)  Weight: 174 lb (78.926 kg)  SpO2: 97%   Gen: Pleasant, well-nourished, in no distress,  normal affect  ENT: No lesions,  mouth clear,  oropharynx clear, no postnasal drip  Neck: No JVD, no TMG, no carotid bruits  Lungs: No use of accessory muscles, clear without rales or rhonchi  Cardiovascular: RRR, heart sounds normal, no murmur or gallops, no peripheral edema  Musculoskeletal: No deformities, no cyanosis or clubbing  Neuro: alert, non focal  Skin: Warm, no lesions or rashes   CT chest 09/19/15 --  COMPARISON: No prior chest CT. CT the abdomen and pelvis 09/19/2015.  FINDINGS: Mediastinum/Lymph Nodes: Heart size is normal. There is no significant pericardial fluid, thickening or pericardial calcification. No pathologically enlarged mediastinal or hilar lymph nodes. Small amount of soft tissue in the anterior mediastinum as morphologic characteristics suggestive of residual thymic tissue. Esophagus is normal in appearance. No axillary lymphadenopathy.  Lungs/Pleura: In the periphery of the right lower lobe there is a tiny 7 x 5 mm (mean diameter of 6 mm) ground-glass attenuation nodule (image 38 of series 4). 8 x 4 mm (mean diameter 6 mm) subpleural nodule in the periphery of the left lower lobe (image 49 of series 4) which is unchanged, and statistically likely a subpleural lymph node. There is also a cyst style No other larger more suspicious appearing pulmonary nodules or masses are noted. No acute consolidative airspace  disease. No pleural effusions.  Upper Abdomen: Unremarkable.  Musculoskeletal/Soft Tissues: Bilateral subpectoral breast implants are noted.  IMPRESSION: 1. There are 2 tiny pulmonary nodules in the lungs bilaterally, which are unchanged. The subpleural nodule in the  periphery of the left lower lobe is statistically likely a subpleural lymph node, and no imaging follow-up is recommended for that lesion. The 6 mm ground-glass attenuation nodule in the periphery of the right lower lobe is also very likely to be benign. Follow-up for this lesion involves is a repeat CT at 12 months is recommended to confirm persistence. If persistent, repeat CT is recommended every 2 years until 5 years of stability has been established. This recommendation follows the consensus statement: Guidelines for Management of Incidental Pulmonary Nodules Detected on CT Images:From the Fleischner Society 2017; published online before print (10.1148/radiol.SG:5268862). 2. Additional incidental findings, as above.       Assessment & Plan:  Asthma Mild obstruction noted on her postbronchodilator trials on primary function testing from 10/29/15. I have asked her to Continue to use albuterol when necessary, make an assessment regarding benefit versus worsening in her bronchospasm.  Allergic rhinitis Discussed potentially adding chlorpheniramine to her Flonase and Allegra.  Pulmonary nodules/lesions, multiple Small right lower lobe and left lower lobe nodules, rounded and groundglass, peripheral all consistent  with possible benign etiology. We will follow her CT scan in one year.

## 2016-01-11 ENCOUNTER — Telehealth: Payer: Self-pay | Admitting: Physician Assistant

## 2016-01-11 MED ORDER — DEXMETHYLPHENIDATE HCL 10 MG PO TABS: 10.0000 mg | ORAL_TABLET | Freq: Two times a day (BID) | ORAL | Status: DC

## 2016-01-11 MED FILL — DEXMETHYLPHENIDATE 10 MG TA: 10 | 30 days supply | Qty: 60 | Fill #0

## 2016-01-11 NOTE — Addendum Note (Signed)
Addended by: Kelle Darting A on: 01/11/2016 10:59 AM   Modules accepted: Orders

## 2016-01-11 NOTE — Telephone Encounter (Signed)
Last Rx printed 11/27/15, #60. Last UDS 12/06/15 and next due 02/2016.   Rx printed and forwarded to PCP for signature.

## 2016-02-20 ENCOUNTER — Telehealth: Payer: Self-pay | Admitting: Family

## 2016-02-20 MED ORDER — DEXMETHYLPHENIDATE HCL 10 MG PO TABS: 10.0000 mg | ORAL_TABLET | Freq: Two times a day (BID) | ORAL | Status: DC

## 2016-02-20 NOTE — Telephone Encounter (Signed)
Pt is requesting refill on Focalin.  Last OV: 10/23/2015 Last Fill: 01/11/2016 #60 and 0RF UDS: 12/05/2015 Moderate risk  Please advise.

## 2016-02-20 NOTE — Telephone Encounter (Signed)
See rx. 

## 2016-02-20 NOTE — Telephone Encounter (Signed)
LMOM informing Pt that Rx has been placed at front desk for pick up at her convenience.  

## 2016-02-27 ENCOUNTER — Telehealth: Payer: Self-pay | Admitting: Family

## 2016-02-27 ENCOUNTER — Ambulatory Visit: Payer: 59 | Admitting: Family

## 2016-02-27 NOTE — Telephone Encounter (Signed)
Relation to [id number] Call back number:[phone]  Reason for call:  Patient had to cancel 2pm appointment for today due to having to take her mother to the ED. Patient [facility] to Monday 03/03/16 at 9:45am. Charge or no charge

## 2016-02-27 NOTE — Telephone Encounter (Signed)
No charge. 

## 2016-03-03 ENCOUNTER — Encounter: Payer: Self-pay | Admitting: Family

## 2016-03-03 ENCOUNTER — Ambulatory Visit (INDEPENDENT_AMBULATORY_CARE_PROVIDER_SITE_OTHER): Payer: 59 | Admitting: Family

## 2016-03-03 VITALS — BP 100/70 | HR 52 | Temp 97.6°F | Resp 18 | Ht 66.0 in | Wt 179.4 lb

## 2016-03-03 DIAGNOSIS — F988 Other specified behavioral and emotional disorders with onset usually occurring in childhood and adolescence: Secondary | ICD-10-CM

## 2016-03-03 DIAGNOSIS — Z79899 Other long term (current) drug therapy: Secondary | ICD-10-CM | POA: Diagnosis not present

## 2016-03-03 DIAGNOSIS — F418 Other specified anxiety disorders: Secondary | ICD-10-CM | POA: Diagnosis not present

## 2016-03-03 DIAGNOSIS — F909 Attention-deficit hyperactivity disorder, unspecified type: Secondary | ICD-10-CM

## 2016-03-03 MED ORDER — DEXMETHYLPHENIDATE HCL ER 20 MG PO CP24: 20.0000 mg | ORAL_CAPSULE | Freq: Every day | ORAL | Status: DC

## 2016-03-03 NOTE — Patient Instructions (Signed)
Begin focalin XR.

## 2016-03-03 NOTE — Assessment & Plan Note (Signed)
Uncontrolled. Will give a trial of focalin xr to see if this will better hold her through her shift.

## 2016-03-03 NOTE — Progress Notes (Signed)
Pre visit review using our clinic review tool, if applicable. No additional management support is needed unless otherwise documented below in the visit note. 

## 2016-03-03 NOTE — Progress Notes (Signed)
Subjective:    Patient ID: [name], female    DOB: 08-Mar-1971, 45 y.o.   MRN: [id number]  HPI  [name] is a 45 yr old female who presents today for follow up of her ADD. Working [employer] to [employer], was on 8 hour shifts.  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  Was lasting 6-8 hours, now seems to only be lasting 4 hours.  Reports that her supervisor has commented to her that during certain parts of the day "it seems likely I'm not fully processing."   Depression/anxiety- not currently on meds.  Reports that she had some recent stressors at her job.  Reports that she is now in a new position which is a better environment for her.   Review of Systems    see HPI  Past Medical History  Diagnosis Date  . Asthma   . Allergy     allergic rhinitis  . ADD (attention deficit disorder)   . Adjustment disorder     without depression  . Thyroid disease     hyperthyroidism / Graves Disease - no treatment needed for years per patient  . SVD (spontaneous vaginal delivery)     x 1  . Former smoker   . Endometriosis      Social History   Social History  . Marital Status: Single    Spouse Name: N/A  . Number of Children: 1  . Years of Education: N/A   Occupational History  . RN    Social History Main Topics  . Smoking status: Former Smoker -- 0.50 packs/day for 15 years    Types: Cigarettes    Quit date: 07/19/2011  . Smokeless tobacco: Never Used  . Alcohol Use: Yes     Comment: socially  . Drug Use: No  . Sexual Activity: Yes    Birth Control/ Protection: Pill     Comment: on loestrin    Other Topics Concern  . Not on file   Social History Narrative    Past Surgical History  Procedure Laterality Date  . Right ankle surgery  2002  . Breast surgery  2004    augmentation  . Wisdom tooth extraction    . Laparoscopic tubal ligation Bilateral 12/21/2014    Procedure: LAPAROSCOPIC TUBAL LIGATION;  Surgeon: Dian Queen, MD;  Location: [facility];  Service: Gynecology;  Laterality: Bilateral;    . Dilitation & currettage/hystroscopy with hydrothermal ablation N/A 12/21/2014    Procedure: DILATATION & CURETTAGE/HYSTEROSCOPY WITH HYDROTHERMAL ABLATION;  Surgeon: Dian Queen, MD;  Location: [facility];  Service: Gynecology;  Laterality: N/A;  . Tubal ligation  12/21/14    Family History  Problem Relation Age of Onset  . Diabetes Other   . Stroke Other   . Hypertension Other   . Thyroid disease Other   . Migraines Other     Allergies  Allergen Reactions  . Escitalopram Oxalate Other (See Comments)    Causes tremors.  . Montelukast Sodium Swelling    Causes swelling and pain in the joints.  . Wellbutrin [Bupropion] Other (See Comments)    Did well on a low dose but higher dose caused depression.  . Prednisone Anxiety and Other (See Comments)    Hot flashes, emotional upset    Current Outpatient Prescriptions on File Prior to Visit  Medication Sig Dispense Refill  . albuterol (PROAIR HFA) 108 (90 Base) MCG/ACT inhaler Inhale 2 puffs into the lungs every 6 (six) hours. As needed and before exercise. 3.7 g 5  .  fexofenadine (ALLEGRA) 180 MG tablet Take 180 mg by mouth daily as needed.     . fluticasone (FLONASE) 50 MCG/ACT nasal spray Place 2 sprays into both nostrils daily. 16 g 6  . Multiple Vitamin (MULTIVITAMIN) tablet Take 1 tablet by mouth daily.    . [DISCONTINUED] Norethindrone-Ethinyl Estradiol-Fe Biphas (LO LOESTRIN FE) 1 MG-10 MCG / 10 MCG tablet Take 1 tablet by mouth daily.       No current facility-administered medications on file prior to visit.    BP 100/70 mmHg  Pulse 52  Temp(Src) 97.6 F (36.4 C) (Oral)  Resp 18  Ht 5\' 6"  (1.676 m)  Wt 179 lb 6.4 oz (81.375 kg)  BMI 28.97 kg/m2  SpO2 98%    Objective:   Physical Exam  Constitutional: She is oriented to person, place, and time. She appears well-developed and well-nourished.  Neurological: She is alert and oriented to person, place, and time.  Psychiatric: She has a normal mood and affect. Her  behavior is normal. Judgment and thought content normal.          Assessment & Plan:  15 minutes spent with patient today.  >50% of this time was spent counseling patient on ADHD and treatment.

## 2016-03-03 NOTE — Assessment & Plan Note (Signed)
Stable off meds.  Monitor.  

## 2016-03-06 ENCOUNTER — Encounter: Payer: Self-pay | Admitting: Family

## 2016-03-10 MED FILL — DEXMETHYLPHENIDATE ER 20 MG: 20 | 30 days supply | Qty: 30 | Fill #0

## 2016-03-31 ENCOUNTER — Ambulatory Visit: Payer: 59 | Admitting: Family

## 2016-04-01 ENCOUNTER — Encounter: Payer: Self-pay | Admitting: Family

## 2016-04-02 ENCOUNTER — Ambulatory Visit (INDEPENDENT_AMBULATORY_CARE_PROVIDER_SITE_OTHER): Payer: 59 | Admitting: Family

## 2016-04-02 ENCOUNTER — Encounter: Payer: Self-pay | Admitting: Family

## 2016-04-02 VITALS — BP 100/58 | HR 61 | Temp 98.1°F | Resp 17 | Ht 66.0 in | Wt 180.8 lb

## 2016-04-02 DIAGNOSIS — F418 Other specified anxiety disorders: Secondary | ICD-10-CM

## 2016-04-02 DIAGNOSIS — Z114 Encounter for screening for human immunodeficiency virus [HIV]: Secondary | ICD-10-CM

## 2016-04-02 DIAGNOSIS — F909 Attention-deficit hyperactivity disorder, unspecified type: Secondary | ICD-10-CM

## 2016-04-02 DIAGNOSIS — F988 Other specified behavioral and emotional disorders with onset usually occurring in childhood and adolescence: Secondary | ICD-10-CM

## 2016-04-02 MED ORDER — DEXMETHYLPHENIDATE HCL ER 20 MG PO CP24: 20.0000 mg | ORAL_CAPSULE | Freq: Every day | ORAL | 0 refills | Status: DC

## 2016-04-02 NOTE — Progress Notes (Signed)
Subjective:    Patient ID: [name], female    DOB: 1971-07-21, 45 y.o.   MRN: [id number]  HPI  [name] is a 45 yr old female who presents today for follow up.  Last visit her medication was changed to Focalin XR. She reports that this formulation is helping her much more and she notes sustained concentration throughout her work shift.   Depression/anxiety- reports overall stable.  However, she does report a lot of stress because the husband of a close friend was in a drunk driving accident and is brain dead. She is helping her friend deal with end of life care and decisions.    Review of Systems    see HPI  Past Medical History:  Diagnosis Date  . ADD (attention deficit disorder)   . Adjustment disorder    without depression  . Allergy    allergic rhinitis  . Asthma   . Endometriosis   . Former smoker   . SVD (spontaneous vaginal delivery)    x 1  . Thyroid disease    hyperthyroidism / Graves Disease - no treatment needed for years per patient     Social History   Social History  . Marital status: Single    Spouse name: N/A  . Number of children: 1  . Years of education: N/A   Occupational History  . RN [employer]   Social History Main Topics  . Smoking status: Former Smoker    Packs/day: 0.50    Years: 15.00    Types: Cigarettes    Quit date: 07/19/2011  . Smokeless tobacco: Never Used  . Alcohol use Yes     Comment: socially  . Drug use: No  . Sexual activity: Yes    Birth control/ protection: Pill     Comment: on loestrin    Other Topics Concern  . Not on file   Social History Narrative  . No narrative on file    Past Surgical History:  Procedure Laterality Date  . BREAST SURGERY  2004   augmentation  . DILITATION & CURRETTAGE/HYSTROSCOPY WITH HYDROTHERMAL ABLATION N/A 12/21/2014   Procedure: DILATATION & CURETTAGE/HYSTEROSCOPY WITH HYDROTHERMAL ABLATION;  Surgeon: Dian Queen, MD;  Location: [facility];  Service: Gynecology;   Laterality: N/A;  . LAPAROSCOPIC TUBAL LIGATION Bilateral 12/21/2014   Procedure: LAPAROSCOPIC TUBAL LIGATION;  Surgeon: Dian Queen, MD;  Location: [facility];  Service: Gynecology;  Laterality: Bilateral;  . right ankle surgery  2002  . TUBAL LIGATION  12/21/14  . WISDOM TOOTH EXTRACTION      Family History  Problem Relation Age of Onset  . Diabetes Other   . Stroke Other   . Hypertension Other   . Thyroid disease Other   . Migraines Other     Allergies  Allergen Reactions  . Escitalopram Oxalate Other (See Comments)    Causes tremors.  . Montelukast Sodium Swelling    Causes swelling and pain in the joints.  . Wellbutrin [Bupropion] Other (See Comments)    Did well on a low dose but higher dose caused depression.  . Prednisone Anxiety and Other (See Comments)    Hot flashes, emotional upset    Current Outpatient Prescriptions on File Prior to Visit  Medication Sig Dispense Refill  . fexofenadine (ALLEGRA) 180 MG tablet Take 180 mg by mouth daily as needed.     . fluticasone (FLONASE) 50 MCG/ACT nasal spray Place 2 sprays into both nostrils daily. 16 g 6  .  Multiple Vitamin (MULTIVITAMIN) tablet Take 1 tablet by mouth daily.    . [DISCONTINUED] Norethindrone-Ethinyl Estradiol-Fe Biphas (LO LOESTRIN FE) 1 MG-10 MCG / 10 MCG tablet Take 1 tablet by mouth daily.       No current facility-administered medications on file prior to visit.     BP (!) 100/58 (BP Location: Left Arm, Patient Position: Sitting, Cuff Size: Large)   Pulse 61   Temp 98.1 F (36.7 C) (Oral)   Resp 17   Ht 5\' 6"  (1.676 m)   Wt 180 lb 12.8 oz (82 kg)   SpO2 97%   BMI 29.18 kg/m    Objective:   Physical Exam  Constitutional: She appears well-developed and well-nourished.  Cardiovascular: Normal rate, regular rhythm and normal heart sounds.   No murmur heard. Pulmonary/Chest: Effort normal and breath sounds normal. No respiratory distress. She has no wheezes.  Psychiatric: Her behavior is normal.  Judgment and thought content normal.  Tearful upon discussion of her friend's condition          Assessment & Plan:  15 minutes spent with pt today. >50% of this time was spent counseling pt on her personal stress and adhd.

## 2016-04-02 NOTE — Progress Notes (Signed)
Pre visit review using our clinic review tool, if applicable. No additional management support is needed unless otherwise documented below in the visit note. 

## 2016-04-02 NOTE — Patient Instructions (Signed)
Continue current medication.

## 2016-04-03 ENCOUNTER — Encounter: Payer: Self-pay | Admitting: Family

## 2016-04-04 NOTE — Assessment & Plan Note (Signed)
Improved on focalin xr.

## 2016-04-04 NOTE — Assessment & Plan Note (Signed)
Stable, monitor.  °

## 2016-04-18 MED FILL — DEXMETHYLPHENIDATE ER 20 MG: 20 | 30 days supply | Qty: 30 | Fill #0

## 2016-05-02 ENCOUNTER — Ambulatory Visit: Payer: Self-pay | Admitting: Family

## 2016-05-18 ENCOUNTER — Other Ambulatory Visit: Payer: Self-pay | Admitting: Family

## 2016-05-19 DIAGNOSIS — M9905 Segmental and somatic dysfunction of pelvic region: Secondary | ICD-10-CM | POA: Diagnosis not present

## 2016-05-19 DIAGNOSIS — M546 Pain in thoracic spine: Secondary | ICD-10-CM | POA: Diagnosis not present

## 2016-05-19 DIAGNOSIS — M9901 Segmental and somatic dysfunction of cervical region: Secondary | ICD-10-CM | POA: Diagnosis not present

## 2016-05-19 DIAGNOSIS — M9903 Segmental and somatic dysfunction of lumbar region: Secondary | ICD-10-CM | POA: Diagnosis not present

## 2016-05-19 DIAGNOSIS — M545 Low back pain: Secondary | ICD-10-CM | POA: Diagnosis not present

## 2016-05-19 DIAGNOSIS — M9902 Segmental and somatic dysfunction of thoracic region: Secondary | ICD-10-CM | POA: Diagnosis not present

## 2016-05-19 DIAGNOSIS — M542 Cervicalgia: Secondary | ICD-10-CM | POA: Diagnosis not present

## 2016-05-20 MED ORDER — DEXMETHYLPHENIDATE HCL ER 20 MG PO CP24: 20.0000 mg | ORAL_CAPSULE | Freq: Every day | ORAL | 0 refills | Status: DC

## 2016-05-20 NOTE — Telephone Encounter (Signed)
Last focalin Rf:  04/03/15, #30 Last OV:  04/02/16 Next OV: due 09/2016 UDS:  Due 05/2016. Will have pt give specimen when she picks up Rx.  Rx printed and forwarded to PCP for signature.

## 2016-05-20 NOTE — Telephone Encounter (Signed)
Rx signed and placed at front desk for pick up. 

## 2016-05-29 ENCOUNTER — Telehealth: Payer: Self-pay | Admitting: Family

## 2016-05-29 DIAGNOSIS — Z79899 Other long term (current) drug therapy: Secondary | ICD-10-CM | POA: Diagnosis not present

## 2016-05-29 MED FILL — DEXMETHYLPHENIDATE ER 20 MG: 20 | 30 days supply | Qty: 30 | Fill #0

## 2016-05-29 NOTE — Telephone Encounter (Signed)
Pt came in today to pick up Rx. She says that he would like to know how often she has to give USD sample. She says that it feels that she has given one every time she picks up. Pt would like a call back to discuss further.

## 2016-05-30 NOTE — Telephone Encounter (Signed)
Left detailed message on cell # that UDS screening is random and last screen was 02/2016.

## 2016-06-30 ENCOUNTER — Other Ambulatory Visit: Payer: Self-pay | Admitting: Family

## 2016-06-30 MED ORDER — DEXMETHYLPHENIDATE HCL ER 20 MG PO CP24: 20.0000 mg | ORAL_CAPSULE | Freq: Every day | ORAL | 0 refills | Status: DC

## 2016-06-30 NOTE — Telephone Encounter (Signed)
Last Focalin Rx: 05/20/16, #30 Last OV: 04/02/16 Next OV;  Due 08/2016 UDS: moderate--05/29/16  Rx printed and forwarded to PCP for signature.

## 2016-07-02 MED FILL — DEXMETHYLPHENIDATE ER 20 MG: 20 | 30 days supply | Qty: 30 | Fill #0

## 2016-07-03 DIAGNOSIS — Z113 Encounter for screening for infections with a predominantly sexual mode of transmission: Secondary | ICD-10-CM | POA: Diagnosis not present

## 2016-07-03 DIAGNOSIS — Z114 Encounter for screening for human immunodeficiency virus [HIV]: Secondary | ICD-10-CM | POA: Diagnosis not present

## 2016-07-03 DIAGNOSIS — Z1159 Encounter for screening for other viral diseases: Secondary | ICD-10-CM | POA: Diagnosis not present

## 2016-08-04 ENCOUNTER — Other Ambulatory Visit: Payer: Self-pay | Admitting: Family

## 2016-08-04 MED ORDER — DEXMETHYLPHENIDATE HCL ER 20 MG PO CP24: 20.0000 mg | ORAL_CAPSULE | Freq: Every day | ORAL | 0 refills | Status: DC

## 2016-08-04 NOTE — Telephone Encounter (Signed)
Rx placed at front desk for pick up.  

## 2016-08-04 NOTE — Telephone Encounter (Signed)
Last RF: 06/30/16, #30 Last OV: 04/02/16 Next OV: due 10/03/16 UDS: 05/29/16  Rx printed and forwarded to PCP for signature.

## 2016-08-06 MED FILL — DEXMETHYLPHENIDATE ER 20 MG: 20 | 30 days supply | Qty: 30 | Fill #0

## 2016-08-07 DIAGNOSIS — N760 Acute vaginitis: Secondary | ICD-10-CM | POA: Diagnosis not present

## 2016-08-07 MED FILL — TINIDAZOLE 500 MG TABLET: 500 | 5 days supply | Qty: 10 | Fill #0

## 2016-08-18 ENCOUNTER — Telehealth: Payer: 59 | Admitting: Nurse Practitioner

## 2016-08-18 DIAGNOSIS — B373 Candidiasis of vulva and vagina: Secondary | ICD-10-CM

## 2016-08-18 DIAGNOSIS — B3731 Acute candidiasis of vulva and vagina: Secondary | ICD-10-CM

## 2016-08-18 MED ORDER — FLUCONAZOLE 150 MG PO TABS: 150.0000 mg | ORAL_TABLET | Freq: Once | ORAL | 0 refills | Status: AC

## 2016-08-18 NOTE — Progress Notes (Signed)

## 2016-08-20 MED FILL — FLUCONAZOLE 150 MG TABLET: 150 | 1 days supply | Qty: 1 | Fill #0

## 2016-09-04 ENCOUNTER — Other Ambulatory Visit: Payer: Self-pay | Admitting: Family

## 2016-09-05 MED ORDER — DEXMETHYLPHENIDATE HCL ER 20 MG PO CP24: 20.0000 mg | ORAL_CAPSULE | Freq: Every day | ORAL | 0 refills | Status: DC

## 2016-09-05 NOTE — Telephone Encounter (Signed)
Last Focalin RF: 08/04/16, #30 Last OV: 04/02/16 Next OV: due 10/03/16 and needs to schedule. UDS: 05/2016 and due 08/29/16. Will collect when she picks up Rx.  Rx printed and forwarded to PCP for signature.

## 2016-09-05 NOTE — Telephone Encounter (Signed)
Rx placed at front desk for pick up. Mychart message sent to pt. 

## 2016-09-19 ENCOUNTER — Other Ambulatory Visit: Payer: 59

## 2016-09-19 DIAGNOSIS — Z79899 Other long term (current) drug therapy: Secondary | ICD-10-CM | POA: Diagnosis not present

## 2016-09-19 MED FILL — DEXMETHYLPHENIDATE ER 20 MG: 20 | 30 days supply | Qty: 30 | Fill #0

## 2016-09-29 ENCOUNTER — Telehealth: Payer: 59 | Admitting: Family

## 2016-09-29 DIAGNOSIS — B9689 Other specified bacterial agents as the cause of diseases classified elsewhere: Secondary | ICD-10-CM

## 2016-09-29 DIAGNOSIS — J028 Acute pharyngitis due to other specified organisms: Secondary | ICD-10-CM | POA: Diagnosis not present

## 2016-09-29 MED ORDER — BENZONATATE 100 MG PO CAPS: 100.0000 mg | ORAL_CAPSULE | Freq: Three times a day (TID) | ORAL | 0 refills | Status: DC | PRN

## 2016-09-29 MED ORDER — AZITHROMYCIN 250 MG PO TABS: ORAL_TABLET | ORAL | 0 refills | Status: DC

## 2016-09-29 MED ORDER — PREDNISONE 5 MG PO TABS: 5.0000 mg | ORAL_TABLET | ORAL | 0 refills | Status: DC

## 2016-09-29 MED ORDER — FLUCONAZOLE 150 MG PO TABS: 150.0000 mg | ORAL_TABLET | Freq: Once | ORAL | 0 refills | Status: AC

## 2016-09-29 MED FILL — AZITHROMYCIN 250 MG TABLET: 250 | 5 days supply | Qty: 6 | Fill #0

## 2016-09-29 MED FILL — predniSONE 5 MG TABS: 5 | 6 days supply | Qty: 21 | Fill #0

## 2016-09-29 MED FILL — FLUCONAZOLE 150 MG TABLET: 150 | 2 days supply | Qty: 2 | Fill #0

## 2016-09-29 MED FILL — BENZONATATE 100 MG CAP: 100 | 5 days supply | Qty: 30 | Fill #0

## 2016-09-29 NOTE — Progress Notes (Signed)
We are sorry that you are not feeling well.  Here is how we plan to help!  Based on what you have shared with me it looks like you have upper respiratory tract inflammation that has resulted in a significant cough.  Inflammation and infection in the upper respiratory tract is commonly called bronchitis and has four common causes:  Allergies, Viral Infections, Acid Reflux and Bacterial Infections.  Allergies, viruses and acid reflux are treated by controlling symptoms or eliminating the cause. An example might be a cough caused by taking certain blood pressure medications. You stop the cough by changing the medication. Another example might be a cough caused by acid reflux. Controlling the reflux helps control the cough.  Based on your presentation I believe you most likely have A cough due to bacteria.  When patients have a fever and a productive cough with a change in color or increased sputum production, we are concerned about bacterial bronchitis.  If left untreated it can progress to pneumonia.  If your symptoms do not improve with your treatment plan it is important that you contact your provider.   I have prescribed Azithromyin 250 mg: two tables now and then one tablet daily for 4 additonal days    In addition you may use A non-prescription cough medication called Mucinex DM: take 2 tablets every 12 hours. and A prescription cough medication called Tessalon Perles 100mg. You may take 1-2 capsules every 8 hours as needed for your cough.  Sterapred 5 mg dosepak  USE OF BRONCHODILATOR ("RESCUE") INHALERS: There is a risk from using your bronchodilator too frequently.  The risk is that over-reliance on a medication which only relaxes the muscles surrounding the breathing tubes can reduce the effectiveness of medications prescribed to reduce swelling and congestion of the tubes themselves.  Although you feel brief relief from the bronchodilator inhaler, your asthma may actually be worsening with the  tubes becoming more swollen and filled with mucus.  This can delay other crucial treatments, such as oral steroid medications. If you need to use a bronchodilator inhaler daily, several times per day, you should discuss this with your provider.  There are probably better treatments that could be used to keep your asthma under control.     HOME CARE . Only take medications as instructed by your medical team. . Complete the entire course of an antibiotic. . Drink plenty of fluids and get plenty of rest. . Avoid close contacts especially the very young and the elderly . Cover your mouth if you cough or cough into your sleeve. . Always remember to wash your hands . A steam or ultrasonic humidifier can help congestion.   GET HELP RIGHT AWAY IF: . You develop worsening fever. . You become short of breath . You cough up blood. . Your symptoms persist after you have completed your treatment plan MAKE SURE YOU   Understand these instructions.  Will watch your condition.  Will get help right away if you are not doing well or get worse.  Your e-visit answers were reviewed by a board certified advanced clinical practitioner to complete your personal care plan.  Depending on the condition, your plan could have included both over the counter or prescription medications. If there is a problem please reply  once you have received a response from your provider. Your safety is important to us.  If you have drug allergies check your prescription carefully.    You can use MyChart to ask questions about today's   visit, request a non-urgent call back, or ask for a work or school excuse for 24 hours related to this e-Visit. If it has been greater than 24 hours you will need to follow up with your provider, or enter a new e-Visit to address those concerns. You will get an e-mail in the next two days asking about your experience.  I hope that your e-visit has been valuable and will speed your recovery. Thank you  for using e-visits.   

## 2016-09-29 NOTE — Addendum Note (Signed)
Addended by: Benjamine Mola on: 09/29/2016 01:21 PM   Modules accepted: Orders

## 2016-10-16 ENCOUNTER — Encounter: Payer: Self-pay | Admitting: Family

## 2016-10-19 ENCOUNTER — Other Ambulatory Visit: Payer: Self-pay | Admitting: Family

## 2016-10-20 MED ORDER — DEXMETHYLPHENIDATE HCL ER 20 MG PO CP24: 20.0000 mg | ORAL_CAPSULE | Freq: Every day | ORAL | 0 refills | Status: DC

## 2016-10-20 NOTE — Telephone Encounter (Signed)
See rx, needs UDS when she picks up please.

## 2016-10-20 NOTE — Telephone Encounter (Signed)
Patient contacted by [employer] message to pickup hardcopy of the focalin/do UDS. Called her and left a detailed message as well.

## 2016-10-20 NOTE — Telephone Encounter (Signed)
Patient checking on the status of My Chart message, patient states dexmethylphenidate (FOCALIN XR) 20 MG 24 hr capsule is in need of PA, please advise

## 2016-10-22 NOTE — Telephone Encounter (Signed)
Form received from [place], completed and faxed to [phone]. Awaiting determination.

## 2016-10-24 NOTE — Telephone Encounter (Addendum)
Received fax failure notice for Focalin PA. Form refaxed to [phone].

## 2016-10-30 ENCOUNTER — Encounter: Payer: Self-pay | Admitting: Family

## 2016-10-30 DIAGNOSIS — Z79899 Other long term (current) drug therapy: Secondary | ICD-10-CM | POA: Diagnosis not present

## 2016-10-30 MED FILL — DEXMETHYLPHENIDATE ER 20 MG: 20 | 30 days supply | Qty: 30 | Fill #0

## 2016-11-03 NOTE — Telephone Encounter (Signed)
Received fax from [place] with approval for Focalin XR good from 10/28/16 to 10/27/17. Email sent to pt.

## 2016-11-18 ENCOUNTER — Ambulatory Visit (INDEPENDENT_AMBULATORY_CARE_PROVIDER_SITE_OTHER)
Admission: RE | Admit: 2016-11-18 | Discharge: 2016-11-18 | Disposition: A | Discharge status: Home / Self Care | Payer: 59 | Source: Ambulatory Visit | Attending: Emergency Medicine | Admitting: Emergency Medicine

## 2016-11-18 DIAGNOSIS — R911 Solitary pulmonary nodule: Secondary | ICD-10-CM

## 2016-11-18 DIAGNOSIS — R918 Other nonspecific abnormal finding of lung field: Secondary | ICD-10-CM | POA: Diagnosis not present

## 2016-12-04 ENCOUNTER — Telehealth: Payer: Self-pay | Admitting: Family

## 2016-12-05 MED ORDER — DEXMETHYLPHENIDATE HCL ER 20 MG PO CP24: 20.0000 mg | ORAL_CAPSULE | Freq: Every day | ORAL | 0 refills | Status: DC

## 2016-12-05 NOTE — Telephone Encounter (Signed)
Rx signed and placed at front desk with contract. Pt should complete contract and schedule f/u appt with PCP. Mychart message sent to pt.

## 2016-12-05 NOTE — Telephone Encounter (Signed)
  Last focalin RX: 10/20/16, #30 Last OV: 04/02/16 Next OV: was due 09/2016 and will have pt schedule when she picks up rx UDS: 10/28/16, moderate and due 01/2017.  CSC needs updating and has been placed with Rx to complete when she picks up Rx.  Rx printed and forwarded to covering Provider Music therapist) for signature.

## 2016-12-18 ENCOUNTER — Encounter: Payer: Self-pay | Admitting: Emergency Medicine

## 2016-12-18 ENCOUNTER — Ambulatory Visit (INDEPENDENT_AMBULATORY_CARE_PROVIDER_SITE_OTHER): Payer: 59 | Admitting: Emergency Medicine

## 2016-12-18 DIAGNOSIS — J45909 Unspecified asthma, uncomplicated: Secondary | ICD-10-CM

## 2016-12-18 DIAGNOSIS — R918 Other nonspecific abnormal finding of lung field: Secondary | ICD-10-CM

## 2016-12-18 DIAGNOSIS — J301 Allergic rhinitis due to pollen: Secondary | ICD-10-CM | POA: Diagnosis not present

## 2016-12-18 MED ORDER — ALBUTEROL SULFATE HFA 108 (90 BASE) MCG/ACT IN AERS: 2.0000 | INHALATION_SPRAY | Freq: Four times a day (QID) | RESPIRATORY_TRACT | 2 refills | Status: DC | PRN

## 2016-12-18 NOTE — Progress Notes (Signed)
Subjective:    Patient ID: [name], female    DOB: [birth year], 46 y.o.   MRN: [id number]  HPI 46 year-old woman, former smoker (20 pk-yrs), carries a history of asthma and allergic rhinitis. She uses Allegra as needed, fluticasone nasal spray daily and albuterol available to use when necessary. Abdominal CT to evaluate hematuria on 09/19/15 showed multiple splenic granulomas and calcifications. There were no renal calculi or renal abnormalities addendum 5. There was a 6.5 mm right lower lobe nodule that was noncalcified.  She has dyspnea and wheeze with exertion. She is a runner, but has had to modify her exercise dur to these sx.   ROV 12/07/15 -- follow-up visit for history of asthma, allergies, former tobacco and pulmonary nodules were noted on CT scan of the abdomen in February '17. She had a CT scan of the chest on 11/01/15 that I personally reviewed. This shows 2 pulmonary nodules, a 7 x 5 mm groundglass nodule in the right lower lobe, and an 8 x 4 mm subpleural nodule in the peripheral left lower lobe. These are unchanged compared with abdominal CT.  She is on flonase and allegra. PFT from 10/29/15 reviewed by me > mild to moderate obstruction noted after albuterol was given.   ROV 12/18/16 -- Patient has a history of asthma, allergic rhinitis, former tobacco use. Moderate obstruction noted on pulmonary function testing from March 2017. We have been following 2 pulmonary nodules with serial CT scans. Her most recent was 11/18/16 that I have personally reviewed. This shows that her right lower lobe and left lower lobe nodules are stable in size and appearance. She is not currently on any scheduled bronchodilator. She has been doing fairly well, doesn't exercise as much as she used to do. She is using allegra and flonase.     Review of Systems  Constitutional: Negative for fever and unexpected weight change.  HENT: Negative for congestion, dental problem, ear pain, nosebleeds, postnasal drip,  rhinorrhea, sinus pressure, sneezing, sore throat and trouble swallowing.   Eyes: Negative for redness and itching.  Respiratory: Positive for cough, chest tightness, shortness of breath and wheezing.   Cardiovascular: Negative for palpitations and leg swelling.  Gastrointestinal: Negative for nausea and vomiting.  Genitourinary: Negative for dysuria.  Musculoskeletal: Negative for joint swelling.  Skin: Negative for rash.  Neurological: Negative for headaches.  Hematological: Does not bruise/bleed easily.  Psychiatric/Behavioral: Negative for dysphoric mood. The patient is not nervous/anxious.         Objective:   Physical Exam Vitals:   12/18/16 1605  BP: 102/66  Pulse: 88  SpO2: 98%  Weight: 171 lb 3.2 oz (77.7 kg)  Height: 5\' 6"  (1.676 m)   Gen: Pleasant, well-nourished, in no distress,  normal affect  ENT: No lesions,  mouth clear,  oropharynx clear, no postnasal drip  Neck: No JVD, no TMG, no carotid bruits  Lungs: No use of accessory muscles, clear without rales or rhonchi  Cardiovascular: RRR, heart sounds normal, no murmur or gallops, no peripheral edema  Musculoskeletal: No deformities, no cyanosis or clubbing  Neuro: alert, non focal  Skin: Warm, no lesions or rashes   CT chest 09/19/15 --  COMPARISON: No prior chest CT. CT the abdomen and pelvis 09/19/2015.  FINDINGS: Mediastinum/Lymph Nodes: Heart size is normal. There is no significant pericardial fluid, thickening or pericardial calcification. No pathologically enlarged mediastinal or hilar lymph nodes. Small amount of soft tissue in the anterior mediastinum as morphologic characteristics suggestive of residual  thymic tissue. Esophagus is normal in appearance. No axillary lymphadenopathy.  Lungs/Pleura: In the periphery of the right lower lobe there is a tiny 7 x 5 mm (mean diameter of 6 mm) ground-glass attenuation nodule (image 38 of series 4). 8 x 4 mm (mean diameter 6 mm) subpleural nodule  in the periphery of the left lower lobe (image 49 of series 4) which is unchanged, and statistically likely a subpleural lymph node. There is also a cyst style No other larger more suspicious appearing pulmonary nodules or masses are noted. No acute consolidative airspace disease. No pleural effusions.  Upper Abdomen: Unremarkable.  Musculoskeletal/Soft Tissues: Bilateral subpectoral breast implants are noted.  IMPRESSION: 1. There are 2 tiny pulmonary nodules in the lungs bilaterally, which are unchanged. The subpleural nodule in the periphery of the left lower lobe is statistically likely a subpleural lymph node, and no imaging follow-up is recommended for that lesion. The 6 mm ground-glass attenuation nodule in the periphery of the right lower lobe is also very likely to be benign. Follow-up for this lesion involves is a repeat CT at 12 months is recommended to confirm persistence. If persistent, repeat CT is recommended every 2 years until 5 years of stability has been established. This recommendation follows the consensus statement: Guidelines for Management of Incidental Pulmonary Nodules Detected on CT Images:From the Fleischner Society 2017; published online before print (10.1148/radiol.3754360677). 2. Additional incidental findings, as above.       Assessment & Plan:  Pulmonary nodules/lesions, multiple Stable in size and appearance. These have been stable for 1 year. We will recheck in 12 months  Asthma Not currently requiring a scheduled bronchodilator. Continue albuterol when necessary  Allergic rhinitis Please continue to use your Allegra and Flonase nasal spray  Baltazar Apo, MD, PhD 12/18/2016, 4:26 PM [facility] [phone] or if no answer [phone]

## 2016-12-18 NOTE — Patient Instructions (Signed)
Please continue to use your Allegra and Flonase nasal spray We will order an albuterol inhaler for you to have available. Use this 2 puffs as needed for shortness of breath We will repeat your CT scan of the chest without contrast in April 2019 to follow pulmonary nodules Follow with Dr Lamonte Sakai in April 2019 after your CT scan or sooner if you have any problems

## 2016-12-18 NOTE — Assessment & Plan Note (Signed)
Please continue to use your Allegra and Flonase nasal spray

## 2016-12-18 NOTE — Addendum Note (Signed)
Addended by: Jannette Spanner on: 12/18/2016 04:32 PM   Modules accepted: Orders

## 2016-12-18 NOTE — Assessment & Plan Note (Signed)
Not currently requiring a scheduled bronchodilator. Continue albuterol when necessary

## 2016-12-18 NOTE — Assessment & Plan Note (Signed)
Stable in size and appearance. These have been stable for 1 year. We will recheck in 12 months

## 2016-12-19 MED FILL — DEXMETHYLPHENIDATE ER 20 MG: 20 | 30 days supply | Qty: 30 | Fill #0

## 2016-12-24 ENCOUNTER — Ambulatory Visit (INDEPENDENT_AMBULATORY_CARE_PROVIDER_SITE_OTHER): Payer: 59 | Admitting: Family

## 2016-12-24 ENCOUNTER — Encounter: Payer: Self-pay | Admitting: Family

## 2016-12-24 DIAGNOSIS — F988 Other specified behavioral and emotional disorders with onset usually occurring in childhood and adolescence: Secondary | ICD-10-CM

## 2016-12-24 DIAGNOSIS — F418 Other specified anxiety disorders: Secondary | ICD-10-CM | POA: Diagnosis not present

## 2016-12-24 DIAGNOSIS — J301 Allergic rhinitis due to pollen: Secondary | ICD-10-CM | POA: Diagnosis not present

## 2016-12-24 NOTE — Progress Notes (Signed)
Subjective:    Patient ID: [name], female    DOB: Nov 28, 1970, 46 y.o.   MRN: [id number]  HPI  Anxiety/depression- having some work stress. Working night shift. Having trouble "switching" back and forth between days and nights since her shifts are often not in a row. Stays tired. Just feels "blah."  Trying to switch to days. Doesn't think that this will be possible until December.   ADD- reports well controlled on focalin.  Allergic rhinitis- maintained on allegra and flonase. Stable on these meds.  Review of Systems    see HPI  Past Medical History:  Diagnosis Date  . ADD (attention deficit disorder)   . Adjustment disorder    without depression  . Allergy    allergic rhinitis  . Asthma   . Endometriosis   . Former smoker   . SVD (spontaneous vaginal delivery)    x 1  . Thyroid disease    hyperthyroidism / Graves Disease - no treatment needed for years per patient     Social History   Social History  . Marital status: Single    Spouse name: N/A  . Number of children: 1  . Years of education: N/A   Occupational History  . RN [employer]   Social History Main Topics  . Smoking status: Former Smoker    Packs/day: 0.50    Years: 15.00    Types: Cigarettes    Quit date: 07/19/2011  . Smokeless tobacco: Never Used  . Alcohol use Yes     Comment: socially  . Drug use: No  . Sexual activity: Yes    Birth control/ protection: Pill     Comment: on loestrin    Other Topics Concern  . Not on file   Social History Narrative  . No narrative on file    Past Surgical History:  Procedure Laterality Date  . BREAST SURGERY  2004   augmentation  . DILITATION & CURRETTAGE/HYSTROSCOPY WITH HYDROTHERMAL ABLATION N/A 12/21/2014   Procedure: DILATATION & CURETTAGE/HYSTEROSCOPY WITH HYDROTHERMAL ABLATION;  Surgeon: Dian Queen, MD;  Location: [facility];  Service: Gynecology;  Laterality: N/A;  . LAPAROSCOPIC TUBAL LIGATION Bilateral 12/21/2014   Procedure:  LAPAROSCOPIC TUBAL LIGATION;  Surgeon: Dian Queen, MD;  Location: [facility];  Service: Gynecology;  Laterality: Bilateral;  . right ankle surgery  2002  . TUBAL LIGATION  12/21/14  . WISDOM TOOTH EXTRACTION      Family History  Problem Relation Age of Onset  . Diabetes Other   . Stroke Other   . Hypertension Other   . Thyroid disease Other   . Migraines Other     Allergies  Allergen Reactions  . Escitalopram Oxalate Other (See Comments)    Causes tremors.  . Montelukast Sodium Swelling    Causes swelling and pain in the joints.  . Wellbutrin [Bupropion] Other (See Comments)    Did well on a low dose but higher dose caused depression.  . Prednisone Anxiety and Other (See Comments)    Hot flashes, emotional upset    Current Outpatient Prescriptions on File Prior to Visit  Medication Sig Dispense Refill  . albuterol (PROVENTIL HFA;VENTOLIN HFA) 108 (90 Base) MCG/ACT inhaler Inhale 2 puffs into the lungs every 6 (six) hours as needed for wheezing or shortness of breath. 1 Inhaler 2  . dexmethylphenidate (FOCALIN XR) 20 MG 24 hr capsule Take 1 capsule (20 mg total) by mouth daily. 30 capsule 0  . fexofenadine (ALLEGRA) 180 MG tablet  Take 180 mg by mouth daily as needed.     . fluticasone (FLONASE) 50 MCG/ACT nasal spray Place 2 sprays into both nostrils daily. 16 g 6  . Multiple Vitamin (MULTIVITAMIN) tablet Take 1 tablet by mouth daily.    . [DISCONTINUED] Norethindrone-Ethinyl Estradiol-Fe Biphas (LO LOESTRIN FE) 1 MG-10 MCG / 10 MCG tablet Take 1 tablet by mouth daily.       No current facility-administered medications on file prior to visit.     BP 124/73 (BP Location: Right Arm, Cuff Size: Large)   Pulse (!) 54   Temp 97.8 F (36.6 C) (Oral)   Resp 16   Ht 5\' 6"  (1.676 m)   Wt 169 lb 12.8 oz (77 kg)   SpO2 100% Comment: room air  BMI 27.41 kg/m    Objective:   Physical Exam  Constitutional: She is oriented to person, place, and time. She appears well-developed and  well-nourished.  Cardiovascular: Normal rate, regular rhythm and normal heart sounds.   No murmur heard. Pulmonary/Chest: Effort normal and breath sounds normal. No respiratory distress. She has no wheezes.  Neurological: She is alert and oriented to person, place, and time.  Psychiatric: She has a normal mood and affect. Her behavior is normal. Judgment and thought content normal.          Assessment & Plan:

## 2016-12-24 NOTE — Assessment & Plan Note (Signed)
Stable on allegra and flonase.

## 2016-12-24 NOTE — Assessment & Plan Note (Signed)
Stable on focalin, continue same.

## 2016-12-24 NOTE — Assessment & Plan Note (Addendum)
Stable off of meds.  I think that once she is on a regular sleep schedule she will feel much better. Monitor.

## 2016-12-24 NOTE — Progress Notes (Signed)
Pre visit review using our clinic review tool, if applicable. No additional management support is needed unless otherwise documented below in the visit note. 

## 2016-12-24 NOTE — Patient Instructions (Signed)
Continue current meds 

## 2016-12-31 DIAGNOSIS — Z01419 Encounter for gynecological examination (general) (routine) without abnormal findings: Secondary | ICD-10-CM | POA: Diagnosis not present

## 2016-12-31 DIAGNOSIS — Z6827 Body mass index (BMI) 27.0-27.9, adult: Secondary | ICD-10-CM | POA: Diagnosis not present

## 2017-01-04 ENCOUNTER — Telehealth: Payer: 59 | Admitting: Family

## 2017-01-04 DIAGNOSIS — N390 Urinary tract infection, site not specified: Secondary | ICD-10-CM | POA: Diagnosis not present

## 2017-01-04 DIAGNOSIS — B3731 Acute candidiasis of vulva and vagina: Secondary | ICD-10-CM

## 2017-01-04 DIAGNOSIS — B373 Candidiasis of vulva and vagina: Secondary | ICD-10-CM | POA: Diagnosis not present

## 2017-01-04 MED ORDER — FLUCONAZOLE 150 MG PO TABS: 150.0000 mg | ORAL_TABLET | Freq: Once | ORAL | 0 refills | Status: AC

## 2017-01-04 MED ORDER — CEPHALEXIN 500 MG PO CAPS: 500.0000 mg | ORAL_CAPSULE | Freq: Two times a day (BID) | ORAL | 0 refills | Status: DC

## 2017-01-04 NOTE — Progress Notes (Signed)
We are sorry that you are not feeling well.  Here is how we plan to help!  Based on what you shared with me it looks like you most likely have a simple urinary tract infection.  A UTI (Urinary Tract Infection) is a bacterial infection of the bladder.  Most cases of urinary tract infections are simple to treat but a key part of your care is to encourage you to drink plenty of fluids and watch your symptoms carefully.  I have prescribed Keflex 500 mg twice a day for 7 days.  Your symptoms should gradually improve. Call us if the burning in your urine worsens, you develop worsening fever, back pain or pelvic pain or if your symptoms do not resolve after completing the antibiotic.  I also included Diflucan 150mg  x1 dose as you requested; thank you for adding all the extra detail in the comment boxes.   Urinary tract infections can be prevented by drinking plenty of water to keep your body hydrated.  Also be sure when you wipe, wipe from front to back and don't hold it in!  If possible, empty your bladder every 4 hours.  Your e-visit answers were reviewed by a board certified advanced clinical practitioner to complete your personal care plan.  Depending on the condition, your plan could have included both over the counter or prescription medications.  If there is a problem please reply  once you have received a response from your provider.  Your safety is important to [place].  If you have drug allergies check your prescription carefully.    You can use MyChart to ask questions about today's visit, request a non-urgent call back, or ask for a work or school excuse for 24 hours related to this e-Visit. If it has been greater than 24 hours you will need to follow up with your provider, or enter a new e-Visit to address those concerns.   You will get an e-mail in the next two days asking about your experience.  I hope that your e-visit has been valuable and will speed your recovery. Thank you for using  e-visits.

## 2017-01-19 ENCOUNTER — Other Ambulatory Visit: Payer: Self-pay | Admitting: Family

## 2017-01-19 MED ORDER — DEXMETHYLPHENIDATE HCL ER 20 MG PO CP24: 20.0000 mg | ORAL_CAPSULE | Freq: Every day | ORAL | 0 refills | Status: DC

## 2017-01-19 NOTE — Telephone Encounter (Signed)
Last focalin RX:  12/05/16, # 30 Last OV: 12/24/16 Next OV: due 06/26/17 UDS: 10/28/16 moderate and due 01/28/17. Will collect UDS when she picks up Rx.  Rx printed and forwarded to PCP for signature.

## 2017-01-30 DIAGNOSIS — Z79899 Other long term (current) drug therapy: Secondary | ICD-10-CM | POA: Diagnosis not present

## 2017-01-30 MED FILL — DEXMETHYLPHENIDATE ER 20 MG: 20 | 30 days supply | Qty: 30 | Fill #0

## 2017-02-03 DIAGNOSIS — N760 Acute vaginitis: Secondary | ICD-10-CM | POA: Diagnosis not present

## 2017-03-02 ENCOUNTER — Other Ambulatory Visit: Payer: Self-pay | Admitting: Family

## 2017-03-02 DIAGNOSIS — N760 Acute vaginitis: Secondary | ICD-10-CM | POA: Diagnosis not present

## 2017-03-02 MED ORDER — DEXMETHYLPHENIDATE HCL ER 20 MG PO CP24: 20.0000 mg | ORAL_CAPSULE | Freq: Every day | ORAL | 0 refills | Status: DC

## 2017-03-02 NOTE — Telephone Encounter (Signed)
Last Focalin RX: 01/19/17, #30 Last OV: 12/24/16 Next OV: due 06/26/17 UDS: 01/30/17 and due 05/02/17  Rx printed and forwarded to PCP for signature.

## 2017-03-03 MED FILL — metroNIDAZOLE 0.75 % GEL: 0.75 | 5 days supply | Qty: 70 | Fill #0

## 2017-03-09 MED FILL — DEXMETHYLPHENIDATE ER 20 MG: 20 | 30 days supply | Qty: 30 | Fill #0

## 2017-04-02 ENCOUNTER — Other Ambulatory Visit: Payer: Self-pay | Admitting: Family

## 2017-04-03 MED ORDER — DEXMETHYLPHENIDATE HCL ER 20 MG PO CP24: 20.0000 mg | ORAL_CAPSULE | Freq: Every day | ORAL | 0 refills | Status: DC

## 2017-04-03 NOTE — Telephone Encounter (Signed)
Last RX: 03/02/17, #30 Last OV: 12/24/16 Next OV: 06/26/17 UDS: 01/30/17, moderate. Due 05/03/17.  Rx printed and forwarded to PCP for signature.

## 2017-04-15 DIAGNOSIS — N760 Acute vaginitis: Secondary | ICD-10-CM | POA: Diagnosis not present

## 2017-04-16 MED FILL — SOLOSEC 2 GM PACK: 2 | 30 days supply | Qty: 1 | Fill #0

## 2017-04-17 MED FILL — metroNIDAZOLE 0.75 % GEL: 0.75 | 5 days supply | Qty: 70 | Fill #0

## 2017-04-17 MED FILL — DEXMETHYLPHENIDATE ER 20 MG: 20 | 30 days supply | Qty: 30 | Fill #0

## 2017-05-08 DIAGNOSIS — N760 Acute vaginitis: Secondary | ICD-10-CM | POA: Diagnosis not present

## 2017-05-20 DIAGNOSIS — M545 Low back pain: Secondary | ICD-10-CM | POA: Diagnosis not present

## 2017-05-21 ENCOUNTER — Other Ambulatory Visit: Payer: Self-pay | Admitting: Family

## 2017-05-22 MED ORDER — DEXMETHYLPHENIDATE HCL ER 20 MG PO CP24
20.0000 mg | ORAL_CAPSULE | Freq: Every day | ORAL | 0 refills | Status: DC
Start: 2017-05-22 — End: 2017-06-30

## 2017-05-22 NOTE — Telephone Encounter (Signed)
Last RX: 04/03/17, #30 Last OV: 12/24/16 Next OV: due 06/26/17 UDS: 01/30/17 moderate and due 05/02/17. Will collect when pt picks up rx. Will also schedule f/u for November.  Rx printed and forwarded to covering Provider for signature.

## 2017-05-25 ENCOUNTER — Encounter: Payer: Self-pay | Admitting: Sports Medicine

## 2017-05-25 ENCOUNTER — Ambulatory Visit (INDEPENDENT_AMBULATORY_CARE_PROVIDER_SITE_OTHER): Payer: 59 | Admitting: Sports Medicine

## 2017-05-25 VITALS — BP 110/66 | Ht 67.0 in | Wt 175.0 lb

## 2017-05-25 DIAGNOSIS — M216X2 Other acquired deformities of left foot: Secondary | ICD-10-CM

## 2017-05-25 DIAGNOSIS — M216X1 Other acquired deformities of right foot: Secondary | ICD-10-CM

## 2017-05-25 DIAGNOSIS — M545 Low back pain: Secondary | ICD-10-CM | POA: Diagnosis not present

## 2017-05-25 NOTE — Progress Notes (Signed)
   Subjective:    Patient ID: [name], female    DOB: [birth year], 46 y.o.   MRN: [id number]  HPI chief complaint: New orthotics  Patient comes in today requesting new custom orthotics. Her current orthotics are a couple of years old. She has found them very helpful in controlling her Achilles tendinopathy and pronation. She is without complaint today.  Interim medical history reviewed Medications reviewed Allergies reviewed    Review of Systems As above    Objective:   Physical Exam  Well-developed, well-nourished. No acute distress. Sitting comfortably in exam room.  Examination of both feet show no swelling. Well-preserved longitudinal arch but pronation with walking. No tenderness to palpation. Neurovascularly intact distally.      Assessment & Plan:  Pronation  New custom orthotics were constructed today. Patient found them to be comfortable prior to leaving the office. Total of 30 minutes was spent with the patient with greater than 50% of the time spent in face-to-face consultation discussing orthotic construction, instruction, and fitting. Pronation was well controlled the orthotics in place. She will follow-up as needed.  Patient was fitted for a : standard, cushioned, semi-rigid orthotic. The orthotic was heated and afterward the patient stood on the orthotic blank positioned on the orthotic stand. The patient was positioned in subtalar neutral position and 10 degrees of ankle dorsiflexion in a weight bearing stance. After completion of molding, a stable base was applied to the orthotic blank. The blank was ground to a stable position for weight bearing. Size: 10 Base: Blue EVA Posting: none Additional orthotic padding: none

## 2017-05-27 DIAGNOSIS — M545 Low back pain: Secondary | ICD-10-CM | POA: Diagnosis not present

## 2017-05-29 ENCOUNTER — Ambulatory Visit: Payer: 59 | Admitting: Family

## 2017-05-29 ENCOUNTER — Ambulatory Visit (INDEPENDENT_AMBULATORY_CARE_PROVIDER_SITE_OTHER): Payer: 59 | Admitting: Nurse Practitioner

## 2017-05-29 ENCOUNTER — Encounter: Payer: Self-pay | Admitting: Nurse Practitioner

## 2017-05-29 VITALS — BP 108/73 | HR 55 | Temp 97.9°F | Resp 16 | Ht 66.0 in | Wt 178.8 lb

## 2017-05-29 DIAGNOSIS — N3001 Acute cystitis with hematuria: Secondary | ICD-10-CM | POA: Diagnosis not present

## 2017-05-29 DIAGNOSIS — R350 Frequency of micturition: Secondary | ICD-10-CM | POA: Diagnosis not present

## 2017-05-29 DIAGNOSIS — Z79899 Other long term (current) drug therapy: Secondary | ICD-10-CM | POA: Diagnosis not present

## 2017-05-29 DIAGNOSIS — F988 Other specified behavioral and emotional disorders with onset usually occurring in childhood and adolescence: Secondary | ICD-10-CM | POA: Diagnosis not present

## 2017-05-29 LAB — POC URINALSYSI DIPSTICK (AUTOMATED)
Nitrite, UA: POSITIVE
SPEC GRAV UA: 1.02 (ref 1.010–1.025)
UROBILINOGEN UA: 2 U/dL — AB
pH, UA: 5.5 (ref 5.0–8.0)

## 2017-05-29 MED ORDER — CIPROFLOXACIN HCL 500 MG PO TABS: 500.0000 mg | ORAL_TABLET | Freq: Two times a day (BID) | ORAL | 0 refills | Status: DC

## 2017-05-29 MED ORDER — FLUCONAZOLE 150 MG PO TABS: 150.0000 mg | ORAL_TABLET | Freq: Once | ORAL | 0 refills | Status: AC

## 2017-05-29 MED FILL — CIPROFLOXACIN HCL 500 MG TA: 500 | 3 days supply | Qty: 6 | Fill #0

## 2017-05-29 MED FILL — DEXMETHYLPHENIDATE ER 20 MG: 20 | 30 days supply | Qty: 30 | Fill #0

## 2017-05-29 MED FILL — FLUCONAZOLE 150 MG TABLET: 150 | 1 days supply | Qty: 1 | Fill #0

## 2017-05-29 NOTE — Patient Instructions (Addendum)
Begin cipro antibiotic, 1 pill twice a day for three days. Finish the entire course of cipro. You may continue the AZO until the burning improves.  Remember to stay hydrated.  Please let us know if your symptoms are not better in 3 days.  Thanks for letting me take care of you today :)   Urinary Tract Infection, Adult A urinary tract infection (UTI) is an infection of any part of the urinary tract. The urinary tract includes the:  Kidneys.  Ureters.  Bladder.  Urethra.  These organs make, store, and get rid of pee (urine) in the body. Follow these instructions at home:  Take over-the-counter and prescription medicines only as told by your doctor.  If you were prescribed an antibiotic medicine, take it as told by your doctor. Do not stop taking the antibiotic even if you start to feel better.  Avoid the following drinks: ? Alcohol. ? Caffeine. ? Tea. ? Carbonated drinks.  Drink enough fluid to keep your pee clear or pale yellow.  Keep all follow-up visits as told by your doctor. This is important.  Make sure to: ? Empty your bladder often and completely. Do not to hold pee for long periods of time. ? Empty your bladder before and after sex. ? Wipe from front to back after a bowel movement if you are female. Use each tissue one time when you wipe. Contact a doctor if:  You have back pain.  You have a fever.  You feel sick to your stomach (nauseous).  You throw up (vomit).  Your symptoms do not get better after 3 days.  Your symptoms go away and then come back. Get help right away if:  You have very bad back pain.  You have very bad lower belly (abdominal) pain.  You are throwing up and cannot keep down any medicines or water. This information is not intended to replace advice given to you by your health care provider. Make sure you discuss any questions you have with your health care provider. Document Released: 01/21/2008 Document Revised: 01/10/2016  Document Reviewed: 06/25/2015 Elsevier Interactive Patient Education  [employer].

## 2017-05-29 NOTE — Addendum Note (Signed)
Addended by: Caffie Pinto on: 05/29/2017 04:08 PM   Modules accepted: Orders

## 2017-05-29 NOTE — Progress Notes (Signed)
Subjective:    Patient ID: [name], female    DOB: 08/04/1971, 46 y.o.   MRN: [id number]  HPI [name] is a 46 year old female who presents today with chief complaint of dysuria. The dysuria began on this past Saturday. She took a course of azo and drank cranberry juice earlier this week with some improvement until yesterday, when the dysuria worsened. She reports urinary frequency. She denies fevers, nausea, vomiting, back pain, flank pain, hematuria, vaginal discharge, vaginal bleeding. She started taking keflex on her own at home yesterday but does not feel any better. She reports decreased fluid intake this week, states she just "hasnt thought of drinking water."  Review of Systems  See HPI  Past Medical History:  Diagnosis Date  . ADD (attention deficit disorder)   . Adjustment disorder    without depression  . Allergy    allergic rhinitis  . Asthma   . Endometriosis   . Former smoker   . SVD (spontaneous vaginal delivery)    x 1  . Thyroid disease    hyperthyroidism / Graves Disease - no treatment needed for years per patient     Social History   Social History  . Marital status: Single    Spouse name: N/A  . Number of children: 1  . Years of education: N/A   Occupational History  . RN [employer]   Social History Main Topics  . Smoking status: Former Smoker    Packs/day: 0.50    Years: 15.00    Types: Cigarettes    Quit date: 07/19/2011  . Smokeless tobacco: Never Used  . Alcohol use Yes     Comment: socially  . Drug use: No  . Sexual activity: Yes    Birth control/ protection: Pill     Comment: on loestrin    Other Topics Concern  . Not on file   Social History Narrative  . No narrative on file    Past Surgical History:  Procedure Laterality Date  . BREAST SURGERY  2004   augmentation  . DILITATION & CURRETTAGE/HYSTROSCOPY WITH HYDROTHERMAL ABLATION N/A 12/21/2014   Procedure: DILATATION & CURETTAGE/HYSTEROSCOPY WITH  HYDROTHERMAL ABLATION;  Surgeon: Dian Queen, MD;  Location: [facility];  Service: Gynecology;  Laterality: N/A;  . LAPAROSCOPIC TUBAL LIGATION Bilateral 12/21/2014   Procedure: LAPAROSCOPIC TUBAL LIGATION;  Surgeon: Dian Queen, MD;  Location: [facility];  Service: Gynecology;  Laterality: Bilateral;  . right ankle surgery  2002  . TUBAL LIGATION  12/21/14  . WISDOM TOOTH EXTRACTION      Family History  Problem Relation Age of Onset  . Diabetes Other   . Stroke Other   . Hypertension Other   . Thyroid disease Other   . Migraines Other     Allergies  Allergen Reactions  . Escitalopram Oxalate Other (See Comments)    Causes tremors.  . Montelukast Sodium Swelling    Causes swelling and pain in the joints.  . Wellbutrin [Bupropion] Other (See Comments)    Did well on a low dose but higher dose caused depression.  . Prednisone Anxiety and Other (See Comments)    Hot flashes, emotional upset    Current Outpatient Prescriptions on File Prior to Visit  Medication Sig Dispense Refill  . albuterol (PROVENTIL HFA;VENTOLIN HFA) 108 (90 Base) MCG/ACT inhaler Inhale 2 puffs into the lungs every 6 (six) hours as needed for wheezing or shortness of breath. 1 Inhaler 2  . cephALEXin (KEFLEX) 500  MG capsule Take 1 capsule (500 mg total) by mouth 2 (two) times daily. 14 capsule 0  . dexmethylphenidate (FOCALIN XR) 20 MG 24 hr capsule Take 1 capsule (20 mg total) by mouth daily. 30 capsule 0  . fexofenadine (ALLEGRA) 180 MG tablet Take 180 mg by mouth daily as needed.     . fluticasone (FLONASE) 50 MCG/ACT nasal spray Place 2 sprays into both nostrils daily. 16 g 6  . Multiple Vitamin (MULTIVITAMIN) tablet Take 1 tablet by mouth daily.    . [DISCONTINUED] Norethindrone-Ethinyl Estradiol-Fe Biphas (LO LOESTRIN FE) 1 MG-10 MCG / 10 MCG tablet Take 1 tablet by mouth daily.       No current facility-administered medications on file prior to visit.     BP 108/73 (BP Location: Right Arm, Cuff Size:  Large)   Pulse (!) 55   Temp 97.9 F (36.6 C) (Oral)   Resp 16   Ht 5\' 6"  (1.676 m)   Wt 178 lb 12.8 oz (81.1 kg)   SpO2 100%   BMI 28.86 kg/m       Objective:   Physical Exam  Constitutional: She is oriented to person, place, and time. She appears well-developed and well-nourished. No distress.  Cardiovascular: Normal rate, regular rhythm and normal heart sounds.   Pulmonary/Chest: Effort normal and breath sounds normal.  Abdominal: There is no CVA tenderness.  Neurological: She is alert and oriented to person, place, and time.  Skin: Skin is warm and dry.  Psychiatric: She has a normal mood and affect. Judgment and thought content normal.       Assessment & Plan:  Acute cystitis with hematuria- Cipro 500 mg BID x 3 days. Pt with nitrites, leukocytes, blood in urine despite taking keflex at home. She will continue AZO until the dysuria resolves and increase fluid intake. Instructed to follow up for no improvement, worsening of symptoms. Diflucan x 1 sent per patient request, as she is prone to yeast infections after antibiotics.

## 2017-05-30 LAB — PAIN MGMT, PROFILE 8 W/CONF, U
6 ACETYLMORPHINE: NEGATIVE ng/mL (ref <10)
AMPHETAMINES: NEGATIVE ng/mL (ref <500)
Alcohol Metabolites: NEGATIVE ng/mL (ref <500)
Benzodiazepines: NEGATIVE ng/mL (ref <100)
Buprenorphine, Urine: NEGATIVE ng/mL (ref <5)
COCAINE METABOLITE: NEGATIVE ng/mL (ref <150)
CREATININE: 118.8 mg/dL
MDMA: NEGATIVE ng/mL (ref <500)
Marijuana Metabolite: NEGATIVE ng/mL (ref <20)
OXYCODONE: NEGATIVE ng/mL (ref <100)
Opiates: NEGATIVE ng/mL (ref <100)
Oxidant: POSITIVE ug/mL — AB (ref <200)
PH: 5.35 (ref 4.5–9.0)

## 2017-05-30 LAB — URINE CULTURE
MICRO NUMBER: 81140194
Result:: NO GROWTH
SPECIMEN QUALITY:: ADEQUATE

## 2017-06-03 ENCOUNTER — Telehealth: Payer: 59 | Admitting: Family

## 2017-06-03 DIAGNOSIS — J208 Acute bronchitis due to other specified organisms: Secondary | ICD-10-CM | POA: Diagnosis not present

## 2017-06-03 DIAGNOSIS — R059 Cough, unspecified: Secondary | ICD-10-CM

## 2017-06-03 DIAGNOSIS — R05 Cough: Secondary | ICD-10-CM | POA: Diagnosis not present

## 2017-06-03 MED ORDER — BENZONATATE 100 MG PO CAPS: 100.0000 mg | ORAL_CAPSULE | Freq: Three times a day (TID) | ORAL | 0 refills | Status: DC | PRN

## 2017-06-03 NOTE — Progress Notes (Signed)
We are sorry that you are not feeling well.  Here is how we plan to help!  Based on your presentation I believe you most likely have A cough due to a virus.  This is called viral bronchitis and is best treated by rest, plenty of fluids and control of the cough.  You may use Ibuprofen or Tylenol as directed to help your symptoms.     In addition you may use A non-prescription cough medication called Robitussin DAC. Take 2 teaspoons every 8 hours or Delsym: take 2 teaspoons every 12 hours., A non-prescription cough medication called Mucinex DM: take 2 tablets every 12 hours. and A prescription cough medication called Tessalon Perles 100mg . You may take 1-2 capsules every 8 hours as needed for your cough.  I can send you in prednisone, saw that you state that you can not tolerate that?  From your responses in the eVisit questionnaire you describe inflammation in the upper respiratory tract which is causing a significant cough.  This is commonly called Bronchitis and has four common causes:    Allergies  Viral Infections  Acid Reflux  Bacterial Infection Allergies, viruses and acid reflux are treated by controlling symptoms or eliminating the cause. An example might be a cough caused by taking certain blood pressure medications. You stop the cough by changing the medication. Another example might be a cough caused by acid reflux. Controlling the reflux helps control the cough.  USE OF BRONCHODILATOR ("RESCUE") INHALERS: There is a risk from using your bronchodilator too frequently.  The risk is that over-reliance on a medication which only relaxes the muscles surrounding the breathing tubes can reduce the effectiveness of medications prescribed to reduce swelling and congestion of the tubes themselves.  Although you feel brief relief from the bronchodilator inhaler, your asthma may actually be worsening with the tubes becoming more swollen and filled with mucus.  This can delay other crucial  treatments, such as oral steroid medications. If you need to use a bronchodilator inhaler daily, several times per day, you should discuss this with your provider.  There are probably better treatments that could be used to keep your asthma under control.     HOME CARE . Only take medications as instructed by your medical team. . Complete the entire course of an antibiotic. . Drink plenty of fluids and get plenty of rest. . Avoid close contacts especially the very young and the elderly . Cover your mouth if you cough or cough into your sleeve. . Always remember to wash your hands . A steam or ultrasonic humidifier can help congestion.   GET HELP RIGHT AWAY IF: . You develop worsening fever. . You become short of breath . You cough up blood. . Your symptoms persist after you have completed your treatment plan MAKE SURE YOU   Understand these instructions.  Will watch your condition.  Will get help right away if you are not doing well or get worse.  Your e-visit answers were reviewed by a board certified advanced clinical practitioner to complete your personal care plan.  Depending on the condition, your plan could have included both over the counter or prescription medications. If there is a problem please reply  once you have received a response from your provider. Your safety is important to [place].  If you have drug allergies check your prescription carefully.    You can use MyChart to ask questions about today's visit, request a non-urgent call back, or ask for a work or  school excuse for 24 hours related to this e-Visit. If it has been greater than 24 hours you will need to follow up with your provider, or enter a new e-Visit to address those concerns. You will get an e-mail in the next two days asking about your experience.  I hope that your e-visit has been valuable and will speed your recovery. Thank you for using e-visits.

## 2017-06-16 DIAGNOSIS — M545 Low back pain: Secondary | ICD-10-CM | POA: Diagnosis not present

## 2017-06-23 ENCOUNTER — Ambulatory Visit (INDEPENDENT_AMBULATORY_CARE_PROVIDER_SITE_OTHER): Payer: 59 | Admitting: Family Medicine

## 2017-06-23 ENCOUNTER — Ambulatory Visit (HOSPITAL_BASED_OUTPATIENT_CLINIC_OR_DEPARTMENT_OTHER)
Admission: RE | Admit: 2017-06-23 | Discharge: 2017-06-23 | Disposition: A | Discharge status: Home / Self Care | Payer: 59 | Source: Ambulatory Visit | Attending: Family Medicine | Admitting: Family Medicine

## 2017-06-23 ENCOUNTER — Encounter: Payer: Self-pay | Admitting: Family Medicine

## 2017-06-23 VITALS — BP 111/70 | HR 52 | Temp 98.1°F | Resp 16 | Ht 66.0 in | Wt 178.0 lb

## 2017-06-23 DIAGNOSIS — R1011 Right upper quadrant pain: Secondary | ICD-10-CM | POA: Diagnosis not present

## 2017-06-23 DIAGNOSIS — Z79899 Other long term (current) drug therapy: Secondary | ICD-10-CM

## 2017-06-23 DIAGNOSIS — L928 Other granulomatous disorders of the skin and subcutaneous tissue: Secondary | ICD-10-CM | POA: Insufficient documentation

## 2017-06-23 DIAGNOSIS — R300 Dysuria: Secondary | ICD-10-CM | POA: Diagnosis not present

## 2017-06-23 DIAGNOSIS — N760 Acute vaginitis: Secondary | ICD-10-CM

## 2017-06-23 DIAGNOSIS — R350 Frequency of micturition: Secondary | ICD-10-CM

## 2017-06-23 DIAGNOSIS — Q447 Other congenital malformations of liver: Secondary | ICD-10-CM | POA: Insufficient documentation

## 2017-06-23 DIAGNOSIS — M160 Bilateral primary osteoarthritis of hip: Secondary | ICD-10-CM | POA: Diagnosis not present

## 2017-06-23 DIAGNOSIS — R918 Other nonspecific abnormal finding of lung field: Secondary | ICD-10-CM | POA: Insufficient documentation

## 2017-06-23 DIAGNOSIS — I708 Atherosclerosis of other arteries: Secondary | ICD-10-CM | POA: Insufficient documentation

## 2017-06-23 DIAGNOSIS — R109 Unspecified abdominal pain: Secondary | ICD-10-CM | POA: Diagnosis not present

## 2017-06-23 DIAGNOSIS — F988 Other specified behavioral and emotional disorders with onset usually occurring in childhood and adolescence: Secondary | ICD-10-CM | POA: Diagnosis not present

## 2017-06-23 LAB — POC URINALSYSI DIPSTICK (AUTOMATED)
BILIRUBIN UA: NEGATIVE
Glucose, UA: NEGATIVE
KETONES UA: NEGATIVE
LEUKOCYTES UA: NEGATIVE
Nitrite, UA: NEGATIVE
PH UA: 6 (ref 5.0–8.0)
PROTEIN UA: NEGATIVE
SPEC GRAV UA: 1.025 (ref 1.010–1.025)
Urobilinogen, UA: 0.2 E.U./dL

## 2017-06-23 MED ORDER — FLUCONAZOLE 150 MG PO TABS: 150.0000 mg | ORAL_TABLET | Freq: Once | ORAL | 0 refills | Status: AC

## 2017-06-23 MED ORDER — CIPROFLOXACIN HCL 250 MG PO TABS: 250.0000 mg | ORAL_TABLET | Freq: Two times a day (BID) | ORAL | 0 refills | Status: DC

## 2017-06-23 MED FILL — FLUCONAZOLE 150 MG TABLET: 150 | 2 days supply | Qty: 2 | Fill #0

## 2017-06-23 MED FILL — CIPROFLOXACIN HCL 250 MG TA: 250 | 3 days supply | Qty: 6 | Fill #0

## 2017-06-23 NOTE — Progress Notes (Signed)
Patient ID: [name], female   DOB: [birth year], 46 y.o.   MRN: [id number]     Subjective:  I acted as a Education administrator for Dr. Carollee Herter.  Guerry Bruin, [place]   Patient ID: [name], female    DOB: [birth year], 46 y.o.   MRN: [id number]  Chief Complaint  Patient presents with  . Dysuria    Dysuria   This is a new problem. The current episode started today. The quality of the pain is described as burning. Associated symptoms include frequency and urgency. Pertinent negatives include no chills, flank pain, hematuria, nausea or vomiting. She has tried nothing for the symptoms.    Patient is in today for dysuria.  Patient Care Team: Debbrah Alar, NP as PCP - General (Internal Medicine)   Past Medical History:  Diagnosis Date  . ADD (attention deficit disorder)   . Adjustment disorder    without depression  . Allergy    allergic rhinitis  . Asthma   . Endometriosis   . Former smoker   . SVD (spontaneous vaginal delivery)    x 1  . Thyroid disease    hyperthyroidism / Graves Disease - no treatment needed for years per patient    Past Surgical History:  Procedure Laterality Date  . BREAST SURGERY  2004   augmentation  . right ankle surgery  2002  . TUBAL LIGATION  12/21/14  . WISDOM TOOTH EXTRACTION      Family History  Problem Relation Age of Onset  . Diabetes Other   . Stroke Other   . Hypertension Other   . Thyroid disease Other   . Migraines Other     Social History   Socioeconomic History  . Marital status: Single    Spouse name: Not on file  . Number of children: 1  . Years of education: Not on file  . Highest education level: Not on file  Social Needs  . Financial resource strain: Not on file  . Food insecurity - worry: Not on file  . Food insecurity - inability: Not on file  . Transportation needs - medical: Not on file  . Transportation needs - non-medical: Not on file  Occupational History  . Occupation: Programmer, multimedia: [employer]  Tobacco Use  . Smoking status: Former Smoker    Packs/day: 0.50    Years: 15.00    Pack years: 7.50    Types: Cigarettes    Last attempt to quit: 07/19/2011    Years since quitting: 5.9  . Smokeless tobacco: Never Used  Substance and Sexual Activity  . Alcohol use: Yes    Comment: socially  . Drug use: No  . Sexual activity: Yes    Birth control/protection: Pill    Comment: on loestrin   Other Topics Concern  . Not on file  Social History Narrative  . Not on file    Outpatient Medications Prior to Visit  Medication Sig Dispense Refill  . albuterol (PROVENTIL HFA;VENTOLIN HFA) 108 (90 Base) MCG/ACT inhaler Inhale 2 puffs into the lungs every 6 (six) hours as needed for wheezing or shortness of breath. 1 Inhaler 2  . dexmethylphenidate (FOCALIN XR) 20 MG 24 hr capsule Take 1 capsule (20 mg total) by mouth daily. 30 capsule 0  . fexofenadine (ALLEGRA) 180 MG tablet Take 180 mg by mouth daily as needed.     . fluticasone (FLONASE) 50 MCG/ACT nasal spray Place 2 sprays into both nostrils daily. 16 g 6  .  Multiple Vitamin (MULTIVITAMIN) tablet Take 1 tablet by mouth daily.    . benzonatate (TESSALON PERLES) 100 MG capsule Take 1 capsule (100 mg total) by mouth 3 (three) times daily as needed. 20 capsule 0  . cephALEXin (KEFLEX) 500 MG capsule Take 1 capsule (500 mg total) by mouth 2 (two) times daily. 14 capsule 0  . ciprofloxacin (CIPRO) 500 MG tablet Take 1 tablet (500 mg total) by mouth 2 (two) times daily. 6 tablet 0   No facility-administered medications prior to visit.     Allergies  Allergen Reactions  . Escitalopram Oxalate Other (See Comments)    Causes tremors.  . Montelukast Sodium Swelling    Causes swelling and pain in the joints.  . Wellbutrin [Bupropion] Other (See Comments)    Did well on a low dose but higher dose caused depression.  . Prednisone Anxiety and Other (See Comments)    Hot flashes, emotional upset    Review of Systems  Constitutional:  Negative for chills, fever and malaise/fatigue.  HENT: Negative for congestion and hearing loss.   Eyes: Negative for discharge.  Respiratory: Negative for cough, sputum production and shortness of breath.   Cardiovascular: Negative for chest pain, palpitations and leg swelling.  Gastrointestinal: Negative for abdominal pain, blood in stool, constipation, diarrhea, heartburn, nausea and vomiting.  Genitourinary: Positive for dysuria, frequency and urgency. Negative for flank pain and hematuria.  Musculoskeletal: Negative for back pain, falls and myalgias.  Skin: Negative for rash.  Neurological: Negative for dizziness, sensory change, loss of consciousness, weakness and headaches.  Endo/Heme/Allergies: Negative for environmental allergies. Does not bruise/bleed easily.  Psychiatric/Behavioral: Negative for depression and suicidal ideas. The patient is not nervous/anxious and does not have insomnia.        Objective:    Physical Exam  Constitutional: She is oriented to person, place, and time. She appears well-developed and well-nourished.  HENT:  Head: Normocephalic and atraumatic.  Eyes: Conjunctivae and EOM are normal.  Neck: Normal range of motion. Neck supple. No JVD present. Carotid bruit is not present. No thyromegaly present.  Cardiovascular: Normal rate, regular rhythm and normal heart sounds.  No murmur heard. Pulmonary/Chest: Effort normal and breath sounds normal. No respiratory distress. She has no wheezes. She has no rales. She exhibits no tenderness.  Abdominal: She exhibits no mass. There is tenderness in the right upper quadrant and suprapubic area. There is no rebound and no guarding.  Musculoskeletal: She exhibits no edema.  Neurological: She is alert and oriented to person, place, and time.  Psychiatric: She has a normal mood and affect.  Nursing note and vitals reviewed.   BP 111/70 (BP Location: Left Arm, Cuff Size: Normal)   Pulse (!) 52   Temp 98.1 F (36.7  C) (Oral)   Resp 16   Ht '5\' 6"'$  (1.676 m)   Wt 178 lb (80.7 kg)   SpO2 98%   BMI 28.73 kg/m  Wt Readings from Last 3 Encounters:  06/23/17 178 lb (80.7 kg)  05/29/17 178 lb 12.8 oz (81.1 kg)  05/25/17 175 lb (79.4 kg)   BP Readings from Last 3 Encounters:  06/23/17 111/70  05/29/17 108/73  05/25/17 110/66     Immunization History  Administered Date(s) Administered  . Hepatitis B 07/02/2006, 08/10/2006, 12/28/2006  . Influenza Whole 06/07/2010  . Influenza-Unspecified 04/19/2015  . MMR 07/16/2006  . Td 07/02/2006  . Tdap 10/23/2015    Health Maintenance  Topic Date Due  . INFLUENZA VACCINE  03/18/2017  .  PAP SMEAR  10/16/2017  . TETANUS/TDAP  10/22/2025  . HIV Screening  Completed    Lab Results  Component Value Date   WBC 3.5 (L) 10/23/2015   HGB 13.3 10/23/2015   HCT 39.2 10/23/2015   PLT 191.0 10/23/2015   GLUCOSE 92 10/23/2015   CHOL 121 10/23/2015   TRIG 55.0 10/23/2015   HDL 62.40 10/23/2015   LDLCALC 48 10/23/2015   ALT 15 10/23/2015   AST 20 10/23/2015   NA 140 10/23/2015   K 4.2 10/23/2015   CL 106 10/23/2015   CREATININE 0.83 10/23/2015   BUN 21 10/23/2015   CO2 27 10/23/2015   TSH 1.86 10/23/2015    Lab Results  Component Value Date   TSH 1.86 10/23/2015   Lab Results  Component Value Date   WBC 3.5 (L) 10/23/2015   HGB 13.3 10/23/2015   HCT 39.2 10/23/2015   MCV 93.3 10/23/2015   PLT 191.0 10/23/2015   Lab Results  Component Value Date   NA 140 10/23/2015   K 4.2 10/23/2015   CO2 27 10/23/2015   GLUCOSE 92 10/23/2015   BUN 21 10/23/2015   CREATININE 0.83 10/23/2015   BILITOT 0.6 10/23/2015   ALKPHOS 49 10/23/2015   AST 20 10/23/2015   ALT 15 10/23/2015   PROT 6.8 10/23/2015   ALBUMIN 4.3 10/23/2015   CALCIUM 9.4 10/23/2015   GFR 79.59 10/23/2015   Lab Results  Component Value Date   CHOL 121 10/23/2015   Lab Results  Component Value Date   HDL 62.40 10/23/2015   Lab Results  Component Value Date   LDLCALC 48  10/23/2015   Lab Results  Component Value Date   TRIG 55.0 10/23/2015   Lab Results  Component Value Date   CHOLHDL 2 10/23/2015   No results found for: HGBA1C       Assessment & Plan:   Problem List Items Addressed This Visit      Unprioritized   Right upper quadrant abdominal pain    With hematuria ? Kidney stone Check ct Strain urine       Relevant Orders   CT RENAL STONE STUDY (Completed)    Other Visit Diagnoses    Dysuria    -  Primary   Relevant Medications   ciprofloxacin (CIPRO) 250 MG tablet   Other Relevant Orders   POCT Urinalysis Dipstick (Automated) (Completed)   Urine Culture   Urinary frequency       Relevant Medications   ciprofloxacin (CIPRO) 250 MG tablet   Acute vaginitis       High risk medication use       Relevant Orders   Pain Mgmt, Profile 8 w/Conf, U      I have discontinued Asucena P. Folden's cephALEXin, ciprofloxacin, and benzonatate. I am also having her start on ciprofloxacin and fluconazole. Additionally, I am having her maintain her fexofenadine, multivitamin, fluticasone, albuterol, and dexmethylphenidate.  Meds ordered this encounter  Medications  . ciprofloxacin (CIPRO) 250 MG tablet    Sig: Take 1 tablet (250 mg total) 2 (two) times daily by mouth.    Dispense:  6 tablet    Refill:  0  . fluconazole (DIFLUCAN) 150 MG tablet    Sig: Take 1 tablet (150 mg total) once for 1 dose by mouth. May repeat in 3 days prn    Dispense:  2 tablet    Refill:  0    CMA served as scribe during this visit. History, Physical and Plan  performed by medical provider. Documentation and orders reviewed and attested to.  Ann Held, DO

## 2017-06-23 NOTE — Patient Instructions (Signed)
Kidney Stones  Kidney stones (urolithiasis) are solid, rock-like deposits that form inside of the organs that make urine (kidneys). A kidney stone may form in a kidney and move into the bladder, where it can cause intense pain and block the flow of urine. Kidney stones are created when high levels of certain minerals are found in the urine. They are usually passed through urination, but in some cases, medical treatment may be needed to remove them.  What are the causes?  Kidney stones may be caused by:  · A condition in which certain glands produce too much parathyroid hormone (primary hyperparathyroidism), which causes too much calcium buildup in the blood.  · Buildup of uric acid crystals in the bladder (hyperuricosuria). Uric acid is a chemical that the body produces when you eat certain foods. It usually exits the body in the urine.  · Narrowing (stricture) of one or both of the tubes that drain urine from the kidneys to the bladder (ureters).  · A kidney blockage that is present at birth (congenital obstruction).  · Past surgery on the kidney or the ureters, such as gastric bypass surgery.    What increases the risk?  The following factors make you more likely to develop kidney stones:  · Having had a kidney stone in the past.  · Having a family history of kidney stones.  · Not drinking enough water.  · Eating a diet that is high in protein, salt (sodium), or sugar.  · Being overweight or obese.    What are the signs or symptoms?  Symptoms of a kidney stone may include:  · Nausea.  · Vomiting.  · Blood in the urine (hematuria).  · Pain in the side of the abdomen, right below the ribs (flank pain). Pain usually spreads (radiates) to the groin.  · Needing to urinate frequently or urgently.    How is this diagnosed?  This condition may be diagnosed based on:  · Your medical history.  · A physical exam.  · Blood tests.  · Urine tests.  · CT scan.  · Abdominal X-ray.  · A procedure to examine the inside of the  bladder (cystoscopy).    How is this treated?  Treatment for kidney stones depends on the size, location, and makeup of the stones. Treatment may involve:  · Analyzing your urine before and after you pass the stone through urination.  · Being monitored at the hospital until you pass the stone through urination.  · Increasing your fluid intake and decreasing the amount of calcium and protein in your diet.  · A procedure to break up kidney stones in the bladder using:  ? A focused beam of light (laser therapy).  ? Shock waves (extracorporeal shock wave lithotripsy).  · Surgery to remove kidney stones. This may be needed if you have severe pain or have stones that block your urinary tract.    Follow these instructions at home:  Eating and drinking     · Drink enough fluid to keep your urine clear or pale yellow. This will help you to pass the kidney stone.  · If directed, change your diet. This may include:  ? Limiting how much sodium you eat.  ? Eating more fruits and vegetables.  ? Limiting how much meat, poultry, fish, and eggs you eat.  · Follow instructions from your health care provider about eating or drinking restrictions.  General instructions   · Collect urine samples as told by your health care   provider. You may need to collect a urine sample:  ? 24 hours after you pass the stone.  ? 8-12 weeks after passing the kidney stone, and every 6-12 months after that.  · Strain your urine every time you urinate, for as long as directed. Use the strainer that your health care provider recommends.  · Do not throw out the kidney stone after passing it. Keep the stone so it can be tested by your health care provider. Testing the makeup of your kidney stone may help prevent you from getting kidney stones in the future.  · Take over-the-counter and prescription medicines only as told by your health care provider.  · Keep all follow-up visits as told by your health care provider. This is important. You may need follow-up  X-rays or ultrasounds to make sure that your stone has passed.  How is this prevented?  To prevent another kidney stone:  · Drink enough fluid to keep your urine clear or pale yellow. This is the best way to prevent kidney stones.  · Eat a healthy diet and follow recommendations from your health care provider about foods to avoid. You may be instructed to eat a low-protein diet. Recommendations vary depending on the type of kidney stone that you have.  · Maintain a healthy weight.    Contact a health care provider if:  · You have pain that gets worse or does not get better with medicine.  Get help right away if:  · You have a fever or chills.  · You develop severe pain.  · You develop new abdominal pain.  · You faint.  · You are unable to urinate.  This information is not intended to replace advice given to you by your health care provider. Make sure you discuss any questions you have with your health care provider.  Document Released: 08/04/2005 Document Revised: 02/22/2016 Document Reviewed: 01/18/2016  Elsevier Interactive Patient Education © 2017 [employer]

## 2017-06-24 DIAGNOSIS — R1011 Right upper quadrant pain: Secondary | ICD-10-CM | POA: Insufficient documentation

## 2017-06-24 LAB — PAIN MGMT, PROFILE 8 W/CONF, U
6 ACETYLMORPHINE: NEGATIVE ng/mL (ref <10)
AMPHETAMINES: NEGATIVE ng/mL (ref <500)
Alcohol Metabolites: NEGATIVE ng/mL (ref <500)
BUPRENORPHINE, URINE: NEGATIVE ng/mL (ref <5)
Benzodiazepines: NEGATIVE ng/mL (ref <100)
Cocaine Metabolite: NEGATIVE ng/mL (ref <150)
Creatinine: 153.9 mg/dL
MDMA: NEGATIVE ng/mL (ref <500)
Marijuana Metabolite: NEGATIVE ng/mL (ref <20)
OXIDANT: NEGATIVE ug/mL (ref <200)
OXYCODONE: NEGATIVE ng/mL (ref <100)
Opiates: NEGATIVE ng/mL (ref <100)
pH: 6.33 (ref 4.5–9.0)

## 2017-06-24 NOTE — Assessment & Plan Note (Signed)
With hematuria ? Kidney stone Check ct Strain urine

## 2017-06-25 LAB — URINE CULTURE
MICRO NUMBER:: 81246547
SPECIMEN QUALITY: ADEQUATE

## 2017-06-30 ENCOUNTER — Other Ambulatory Visit: Payer: Self-pay | Admitting: Family Medicine

## 2017-06-30 NOTE — Telephone Encounter (Signed)
Pt is requesting refill on Focalin XR 20mg .  Last OV: 06/23/2017 Last Fill: 05/22/2017 #30 and 0RF UDS: 10/18/2016 Moderate risk  NCCR printed; placed on ledge  Please advise.

## 2017-07-01 MED ORDER — DEXMETHYLPHENIDATE HCL ER 20 MG PO CP24: 20.0000 mg | ORAL_CAPSULE | Freq: Every day | ORAL | 0 refills | Status: DC

## 2017-07-03 ENCOUNTER — Encounter: Payer: Self-pay | Admitting: Family

## 2017-07-03 ENCOUNTER — Ambulatory Visit (INDEPENDENT_AMBULATORY_CARE_PROVIDER_SITE_OTHER): Payer: 59 | Admitting: Family

## 2017-07-03 VITALS — BP 103/48 | HR 51 | Temp 98.0°F | Resp 16 | Ht 66.0 in | Wt 179.2 lb

## 2017-07-03 DIAGNOSIS — F988 Other specified behavioral and emotional disorders with onset usually occurring in childhood and adolescence: Secondary | ICD-10-CM

## 2017-07-03 DIAGNOSIS — S060X0A Concussion without loss of consciousness, initial encounter: Secondary | ICD-10-CM

## 2017-07-03 DIAGNOSIS — F418 Other specified anxiety disorders: Secondary | ICD-10-CM

## 2017-07-03 MED FILL — DEXMETHYLPHENIDATE ER 20 MG: 20 | 30 days supply | Qty: 30 | Fill #0

## 2017-07-03 NOTE — Progress Notes (Signed)
Subjective:    Patient ID: [name], female    DOB: Nov 09, 1970, 46 y.o.   MRN: [id number]  HPI  Ms. Campanella is a 46 yr old female who presents today for follow up.  1) Anxiety/depression- anxiety stable.  Running again, doing yoga.    2) ADD- continues focalin. 6-7 hours in she loses her concentration. Working nights. Will go to days in mid January.    3) Concussion-  Dog collided with her on Friday.  Has HA.  Mild dizziness if she moves too fast.     Review of Systems    see HPI  Past Medical History:  Diagnosis Date  . ADD (attention deficit disorder)   . Adjustment disorder    without depression  . Allergy    allergic rhinitis  . Asthma   . Endometriosis   . Former smoker   . SVD (spontaneous vaginal delivery)    x 1  . Thyroid disease    hyperthyroidism / Graves Disease - no treatment needed for years per patient     Social History   Socioeconomic History  . Marital status: Single    Spouse name: Not on file  . Number of children: 1  . Years of education: Not on file  . Highest education level: Not on file  Social Needs  . Financial resource strain: Not on file  . Food insecurity - worry: Not on file  . Food insecurity - inability: Not on file  . Transportation needs - medical: Not on file  . Transportation needs - non-medical: Not on file  Occupational History  . Occupation: Programmer, multimedia: [employer]  Tobacco Use  . Smoking status: Former Smoker    Packs/day: 0.50    Years: 15.00    Pack years: 7.50    Types: Cigarettes    Last attempt to quit: 07/19/2011    Years since quitting: 5.9  . Smokeless tobacco: Never Used  Substance and Sexual Activity  . Alcohol use: Yes    Comment: socially  . Drug use: No  . Sexual activity: Yes    Birth control/protection: Pill    Comment: on loestrin   Other Topics Concern  . Not on file  Social History Narrative  . Not on file    Past Surgical History:  Procedure Laterality Date  .  BREAST SURGERY  2004   augmentation  . DILATATION & CURETTAGE/HYSTEROSCOPY WITH HYDROTHERMAL ABLATION N/A 12/21/2014   Performed by Dian Queen, MD at [facility] ORS  . LAPAROSCOPIC TUBAL LIGATION Bilateral 12/21/2014   Performed by Dian Queen, MD at [facility]  . right ankle surgery  2002  . TUBAL LIGATION  12/21/14  . WISDOM TOOTH EXTRACTION      Family History  Problem Relation Age of Onset  . Diabetes Other   . Stroke Other   . Hypertension Other   . Thyroid disease Other   . Migraines Other     Allergies  Allergen Reactions  . Escitalopram Oxalate Other (See Comments)    Causes tremors.  . Montelukast Sodium Swelling    Causes swelling and pain in the joints.  . Wellbutrin [Bupropion] Other (See Comments)    Did well on a low dose but higher dose caused depression.  . Prednisone Anxiety and Other (See Comments)    Hot flashes, emotional upset    Current Outpatient Medications on File Prior to Visit  Medication Sig Dispense Refill  . albuterol (PROVENTIL HFA;VENTOLIN HFA)  108 (90 Base) MCG/ACT inhaler Inhale 2 puffs into the lungs every 6 (six) hours as needed for wheezing or shortness of breath. 1 Inhaler 2  . dexmethylphenidate (FOCALIN XR) 20 MG 24 hr capsule Take 1 capsule (20 mg total) daily by mouth. 30 capsule 0  . fexofenadine (ALLEGRA) 180 MG tablet Take 180 mg by mouth daily as needed.     . fluticasone (FLONASE) 50 MCG/ACT nasal spray Place 2 sprays into both nostrils daily. 16 g 6  . Multiple Vitamin (MULTIVITAMIN) tablet Take 1 tablet by mouth daily.    . [DISCONTINUED] Norethindrone-Ethinyl Estradiol-Fe Biphas (LO LOESTRIN FE) 1 MG-10 MCG / 10 MCG tablet Take 1 tablet by mouth daily.       No current facility-administered medications on file prior to visit.     BP (!) 103/48 (BP Location: Right Arm, Patient Position: Sitting, Cuff Size: Normal)   Pulse (!) 51   Temp 98 F (36.7 C) (Oral)   Resp 16   Ht 5\' 6"  (1.676 m)   Wt 179 lb 3.2 oz (81.3 kg)   SpO2  99%   BMI 28.92 kg/m    Objective:   Physical Exam  Constitutional: She is oriented to person, place, and time. She appears well-developed and well-nourished.  Cardiovascular: Normal rate, regular rhythm and normal heart sounds.  No murmur heard. Pulmonary/Chest: Effort normal and breath sounds normal. No respiratory distress. She has no wheezes.  Neurological: She is alert and oriented to person, place, and time. No cranial nerve deficit. She exhibits normal muscle tone. Coordination normal.  Reflex Scores:      Patellar reflexes are 2+ on the right side and 2+ on the left side. Skin:  Ecchymosis surrounding right eye  Psychiatric: She has a normal mood and affect. Her behavior is normal. Judgment and thought content normal.          Assessment & Plan:  Concussion-she is intact from a neuro standpoint.  Concussion precautions discussed.

## 2017-07-04 NOTE — Assessment & Plan Note (Signed)
Stable on  Focalin, continue same.  

## 2017-07-04 NOTE — Assessment & Plan Note (Signed)
Stable off of medications.  Monitor.  

## 2017-07-16 ENCOUNTER — Telehealth: Payer: Self-pay | Admitting: Family

## 2017-07-16 NOTE — Telephone Encounter (Signed)
Opened in error

## 2017-08-06 ENCOUNTER — Other Ambulatory Visit: Payer: Self-pay | Admitting: Family

## 2017-08-07 NOTE — Telephone Encounter (Signed)
Last focalin RX: 07/01/17, #30 Last OV: 06/23/17 Next OV: due 12/2016 UDS: 06/23/17 CSC:  12/05/16 CSR: No discrepancies identified

## 2017-08-08 MED ORDER — DEXMETHYLPHENIDATE HCL ER 20 MG PO CP24: 20.0000 mg | ORAL_CAPSULE | Freq: Every day | ORAL | 0 refills | Status: DC

## 2017-08-10 MED FILL — DEXMETHYLPHENIDATE ER 20 MG: 20 | 30 days supply | Qty: 30 | Fill #0

## 2017-08-31 DIAGNOSIS — N760 Acute vaginitis: Secondary | ICD-10-CM | POA: Diagnosis not present

## 2017-08-31 DIAGNOSIS — Z113 Encounter for screening for infections with a predominantly sexual mode of transmission: Secondary | ICD-10-CM | POA: Diagnosis not present

## 2017-08-31 MED FILL — metroNIDAZOLE 0.75 % GEL: 0.75 | 5 days supply | Qty: 70 | Fill #0

## 2017-08-31 MED FILL — SOLOSEC 2 GM PACK: 2 | 30 days supply | Qty: 1 | Fill #1

## 2017-08-31 MED FILL — FLUCONAZOLE 150 MG TABLET: 150 | 1 days supply | Qty: 1 | Fill #0

## 2017-09-08 ENCOUNTER — Encounter: Payer: Self-pay | Admitting: Sports Medicine

## 2017-09-08 ENCOUNTER — Ambulatory Visit: Payer: 59 | Admitting: Sports Medicine

## 2017-09-08 VITALS — BP 122/76 | Ht 66.0 in | Wt 180.0 lb

## 2017-09-08 DIAGNOSIS — M25562 Pain in left knee: Secondary | ICD-10-CM

## 2017-09-08 NOTE — Progress Notes (Signed)
   Subjective:    Patient ID: [name], female    DOB: [birth year], 47 y.o.   MRN: [id number]  HPI chief complaint: Left lower leg pain and swelling  [name] comes in today complaining of left lower leg pain and swelling that started after she tripped while running a trail race on January 12. She fell directly onto her left shin. She sustained a small laceration to the proximal anterior shin but was able to complete the race (injury occurred at mild 2 of an 8 mile race). Immediately afterwards she began to notice some swelling in the proximal tibia. Despite this, she has been able to continue running. She does endorse diffuse swelling in her lower leg after running which will resolve with elevation and compression. She was placed on Augmentin for her lower leg wound. No fevers or chills.     Review of Systems    as above Objective:   Physical Exam  Well-developed, well-nourished. No acute distress. Awake alert and oriented 3. Vital signs reviewed  Left lower leg: There is a palpable hematoma in the proximal tibia just below the patella. There is a well-healing wound in the center of the hematoma. Excellent eschar formation. Slight erythema but area is not warm to touch and is not tender to palpation. She has ecchymosis which extends approximately two thirds down the lower leg. No tenderness to palpation or percussion over the tibia. Full painless knee range of motion. Full painless ankle range of motion. Patient is able to bear weight and walk without pain.  MSK ultrasound of the left lower leg was performed. Limited images were obtained. Left lower leg hematoma is confirmed by ultrasound. I do not see a cortical irregularity of the tibia to suggest fracture.      Assessment & Plan:   Left lower leg hematoma status post fall Healing wound, left lower leg  Reassurance is provided. Patient has been able to continue running without pain. Main symptom is post exercise swelling of her  leg. I recommend that she utilize compression when running. Elevate her left leg post exercise. I've explained to her that hematomas can take several weeks before resolving. I do not think we need any other imaging. Her wound appears to be healing quite nicely but she will let me know if it starts to show signs of infection. I think she can continue with activity as tolerated and will follow-up with me for ongoing or recalcitrant issues.

## 2017-09-09 ENCOUNTER — Ambulatory Visit: Payer: Self-pay | Admitting: Family

## 2017-09-11 ENCOUNTER — Ambulatory Visit: Payer: Self-pay | Admitting: Family

## 2017-09-15 ENCOUNTER — Other Ambulatory Visit: Payer: Self-pay | Admitting: Family

## 2017-09-15 NOTE — Telephone Encounter (Signed)
Last RX:08/08/17  Last OV:07/03/17 Next OV: no future appointments UDS:06/23/17 low risk CSC:10/28/16 CSR: I Don't have access to the registry, can you please check this.

## 2017-09-15 NOTE — Telephone Encounter (Signed)
Controlled Substance Registry reviewed and No discrepancies identified.  Please advise below request.

## 2017-09-16 DIAGNOSIS — N951 Menopausal and female climacteric states: Secondary | ICD-10-CM | POA: Diagnosis not present

## 2017-09-16 MED ORDER — DEXMETHYLPHENIDATE HCL ER 20 MG PO CP24: 20.0000 mg | ORAL_CAPSULE | Freq: Every day | ORAL | 0 refills | Status: DC

## 2017-09-16 MED FILL — DEXMETHYLPHENIDATE ER 20 MG: 20 | 30 days supply | Qty: 30 | Fill #0

## 2017-09-17 DIAGNOSIS — R6882 Decreased libido: Secondary | ICD-10-CM | POA: Diagnosis not present

## 2017-09-17 DIAGNOSIS — N951 Menopausal and female climacteric states: Secondary | ICD-10-CM | POA: Diagnosis not present

## 2017-09-17 DIAGNOSIS — G479 Sleep disorder, unspecified: Secondary | ICD-10-CM | POA: Diagnosis not present

## 2017-09-17 DIAGNOSIS — N898 Other specified noninflammatory disorders of vagina: Secondary | ICD-10-CM | POA: Diagnosis not present

## 2017-09-17 DIAGNOSIS — R232 Flushing: Secondary | ICD-10-CM | POA: Diagnosis not present

## 2017-09-30 DIAGNOSIS — M25572 Pain in left ankle and joints of left foot: Secondary | ICD-10-CM | POA: Diagnosis not present

## 2017-09-30 DIAGNOSIS — M545 Low back pain: Secondary | ICD-10-CM | POA: Diagnosis not present

## 2017-10-07 DIAGNOSIS — M25572 Pain in left ankle and joints of left foot: Secondary | ICD-10-CM | POA: Diagnosis not present

## 2017-10-07 DIAGNOSIS — M545 Low back pain: Secondary | ICD-10-CM | POA: Diagnosis not present

## 2017-10-07 MED FILL — SOLOSEC 2 GM PACK: 2 | 30 days supply | Qty: 1 | Fill #2

## 2017-10-19 ENCOUNTER — Other Ambulatory Visit: Payer: Self-pay | Admitting: Emergency Medicine

## 2017-10-19 DIAGNOSIS — R911 Solitary pulmonary nodule: Secondary | ICD-10-CM

## 2017-10-22 ENCOUNTER — Other Ambulatory Visit: Payer: Self-pay | Admitting: Family

## 2017-10-22 DIAGNOSIS — M545 Low back pain: Secondary | ICD-10-CM | POA: Diagnosis not present

## 2017-10-22 DIAGNOSIS — M25572 Pain in left ankle and joints of left foot: Secondary | ICD-10-CM | POA: Diagnosis not present

## 2017-10-22 MED ORDER — DEXMETHYLPHENIDATE HCL ER 20 MG PO CP24: 20.0000 mg | ORAL_CAPSULE | Freq: Every day | ORAL | 0 refills | Status: DC

## 2017-10-22 MED FILL — DEXMETHYLPHENIDATE ER 20 MG: 20 | 30 days supply | Qty: 30 | Fill #0

## 2017-10-22 NOTE — Telephone Encounter (Signed)
Requesting: dexmethylphenidate Contract:12/05/16 UDS:06/23/17 Last Visit:07/03/17 Next Visit:none Last Refill:09/16/17  Please Advise

## 2017-10-26 DIAGNOSIS — M7912 Myalgia of auxiliary muscles, head and neck: Secondary | ICD-10-CM | POA: Diagnosis not present

## 2017-10-26 DIAGNOSIS — M7918 Myalgia, other site: Secondary | ICD-10-CM | POA: Diagnosis not present

## 2017-10-26 DIAGNOSIS — M9903 Segmental and somatic dysfunction of lumbar region: Secondary | ICD-10-CM | POA: Diagnosis not present

## 2017-10-26 DIAGNOSIS — M222X1 Patellofemoral disorders, right knee: Secondary | ICD-10-CM | POA: Diagnosis not present

## 2017-10-26 DIAGNOSIS — S8012XA Contusion of left lower leg, initial encounter: Secondary | ICD-10-CM | POA: Diagnosis not present

## 2017-10-26 DIAGNOSIS — R600 Localized edema: Secondary | ICD-10-CM | POA: Diagnosis not present

## 2017-10-26 DIAGNOSIS — M9901 Segmental and somatic dysfunction of cervical region: Secondary | ICD-10-CM | POA: Diagnosis not present

## 2017-10-26 DIAGNOSIS — M9902 Segmental and somatic dysfunction of thoracic region: Secondary | ICD-10-CM | POA: Diagnosis not present

## 2017-10-26 DIAGNOSIS — M9905 Segmental and somatic dysfunction of pelvic region: Secondary | ICD-10-CM | POA: Diagnosis not present

## 2017-10-30 DIAGNOSIS — M545 Low back pain: Secondary | ICD-10-CM | POA: Diagnosis not present

## 2017-10-30 DIAGNOSIS — M25572 Pain in left ankle and joints of left foot: Secondary | ICD-10-CM | POA: Diagnosis not present

## 2017-11-05 DIAGNOSIS — M545 Low back pain: Secondary | ICD-10-CM | POA: Diagnosis not present

## 2017-11-05 DIAGNOSIS — M25572 Pain in left ankle and joints of left foot: Secondary | ICD-10-CM | POA: Diagnosis not present

## 2017-11-10 DIAGNOSIS — M545 Low back pain: Secondary | ICD-10-CM | POA: Diagnosis not present

## 2017-11-10 DIAGNOSIS — M25572 Pain in left ankle and joints of left foot: Secondary | ICD-10-CM | POA: Diagnosis not present

## 2017-11-12 DIAGNOSIS — M9903 Segmental and somatic dysfunction of lumbar region: Secondary | ICD-10-CM | POA: Diagnosis not present

## 2017-11-12 DIAGNOSIS — M222X1 Patellofemoral disorders, right knee: Secondary | ICD-10-CM | POA: Diagnosis not present

## 2017-11-12 DIAGNOSIS — M25572 Pain in left ankle and joints of left foot: Secondary | ICD-10-CM | POA: Diagnosis not present

## 2017-11-12 DIAGNOSIS — M9901 Segmental and somatic dysfunction of cervical region: Secondary | ICD-10-CM | POA: Diagnosis not present

## 2017-11-12 DIAGNOSIS — M9902 Segmental and somatic dysfunction of thoracic region: Secondary | ICD-10-CM | POA: Diagnosis not present

## 2017-11-12 DIAGNOSIS — M7918 Myalgia, other site: Secondary | ICD-10-CM | POA: Diagnosis not present

## 2017-11-12 DIAGNOSIS — M545 Low back pain: Secondary | ICD-10-CM | POA: Diagnosis not present

## 2017-11-12 DIAGNOSIS — M7912 Myalgia of auxiliary muscles, head and neck: Secondary | ICD-10-CM | POA: Diagnosis not present

## 2017-11-12 DIAGNOSIS — S8012XA Contusion of left lower leg, initial encounter: Secondary | ICD-10-CM | POA: Diagnosis not present

## 2017-11-12 DIAGNOSIS — R600 Localized edema: Secondary | ICD-10-CM | POA: Diagnosis not present

## 2017-11-12 DIAGNOSIS — M9905 Segmental and somatic dysfunction of pelvic region: Secondary | ICD-10-CM | POA: Diagnosis not present

## 2017-11-20 ENCOUNTER — Inpatient Hospital Stay: Admission: RE | Admit: 2017-11-20 | Payer: 59 | Source: Ambulatory Visit

## 2017-11-26 ENCOUNTER — Ambulatory Visit (INDEPENDENT_AMBULATORY_CARE_PROVIDER_SITE_OTHER)
Admission: RE | Admit: 2017-11-26 | Discharge: 2017-11-26 | Disposition: A | Discharge status: Home / Self Care | Payer: 59 | Source: Ambulatory Visit | Attending: Emergency Medicine | Admitting: Emergency Medicine

## 2017-11-26 ENCOUNTER — Other Ambulatory Visit: Payer: Self-pay | Admitting: Family

## 2017-11-26 DIAGNOSIS — R918 Other nonspecific abnormal finding of lung field: Secondary | ICD-10-CM

## 2017-11-26 DIAGNOSIS — R911 Solitary pulmonary nodule: Secondary | ICD-10-CM

## 2017-11-27 MED ORDER — DEXMETHYLPHENIDATE HCL ER 20 MG PO CP24: 20.0000 mg | ORAL_CAPSULE | Freq: Every day | ORAL | 0 refills | Status: DC

## 2017-11-27 MED FILL — DEXMETHYLPHENIDATE ER 20 MG: 20 | 30 days supply | Qty: 30 | Fill #0

## 2017-11-27 NOTE — Telephone Encounter (Signed)
Last Focalin RX: 10/22/17, #30 Last OV: 06/2017 Next OV: due 12/2017 UDS: 06/23/17, moderate CSC: 12/05/16. Needs updating. Can do at next OV? CSR: No discrepancies identified

## 2017-12-14 ENCOUNTER — Encounter: Payer: Self-pay | Admitting: Family

## 2017-12-14 ENCOUNTER — Ambulatory Visit (INDEPENDENT_AMBULATORY_CARE_PROVIDER_SITE_OTHER): Payer: 59 | Admitting: Family

## 2017-12-14 VITALS — BP 111/69 | HR 58 | Temp 98.2°F | Resp 16 | Ht 66.0 in | Wt 183.8 lb

## 2017-12-14 DIAGNOSIS — F909 Attention-deficit hyperactivity disorder, unspecified type: Secondary | ICD-10-CM | POA: Diagnosis not present

## 2017-12-14 DIAGNOSIS — M545 Low back pain: Secondary | ICD-10-CM | POA: Diagnosis not present

## 2017-12-14 DIAGNOSIS — M222X1 Patellofemoral disorders, right knee: Secondary | ICD-10-CM | POA: Diagnosis not present

## 2017-12-14 DIAGNOSIS — M25572 Pain in left ankle and joints of left foot: Secondary | ICD-10-CM | POA: Diagnosis not present

## 2017-12-14 DIAGNOSIS — M9903 Segmental and somatic dysfunction of lumbar region: Secondary | ICD-10-CM | POA: Diagnosis not present

## 2017-12-14 DIAGNOSIS — J45909 Unspecified asthma, uncomplicated: Secondary | ICD-10-CM

## 2017-12-14 DIAGNOSIS — M9902 Segmental and somatic dysfunction of thoracic region: Secondary | ICD-10-CM | POA: Diagnosis not present

## 2017-12-14 DIAGNOSIS — M7912 Myalgia of auxiliary muscles, head and neck: Secondary | ICD-10-CM | POA: Diagnosis not present

## 2017-12-14 DIAGNOSIS — J309 Allergic rhinitis, unspecified: Secondary | ICD-10-CM | POA: Diagnosis not present

## 2017-12-14 DIAGNOSIS — F418 Other specified anxiety disorders: Secondary | ICD-10-CM | POA: Diagnosis not present

## 2017-12-14 DIAGNOSIS — S8012XA Contusion of left lower leg, initial encounter: Secondary | ICD-10-CM | POA: Diagnosis not present

## 2017-12-14 DIAGNOSIS — Z7721 Contact with and (suspected) exposure to potentially hazardous body fluids: Secondary | ICD-10-CM

## 2017-12-14 DIAGNOSIS — M7918 Myalgia, other site: Secondary | ICD-10-CM | POA: Diagnosis not present

## 2017-12-14 DIAGNOSIS — M9901 Segmental and somatic dysfunction of cervical region: Secondary | ICD-10-CM | POA: Diagnosis not present

## 2017-12-14 DIAGNOSIS — M9905 Segmental and somatic dysfunction of pelvic region: Secondary | ICD-10-CM | POA: Diagnosis not present

## 2017-12-14 DIAGNOSIS — R600 Localized edema: Secondary | ICD-10-CM | POA: Diagnosis not present

## 2017-12-14 NOTE — Patient Instructions (Signed)
Please complete lab work prior to leaving.   

## 2017-12-14 NOTE — Progress Notes (Signed)
Subjective:    Patient ID: [name], female    DOB: 1971-08-08, 47 y.o.   MRN: [id number]  HPI  Patient is a 47 yr old female who presents today for follow up.   Depression/Anxiety- Reports mood is good, no anxiety issues.   ADHD-Maintained on Focalin. Switched from nights to days 5-6 weeks ago. Still acclimating.  Working more due to ill coworker.   Allergic rhinitis- continues allegra and flonase. Helping but still has some symptoms.  She is uses albuterol prn.  Uses before exercise.   Had projectile vomited 2-3 units of bloody emesis.  This occurred the Friday before easter. Denies mucous membrane exposure.  Denies needlestick. Pt HIV+, Hep B and sypilis.    Review of Systems See HPI  Past Medical History:  Diagnosis Date  . ADD (attention deficit disorder)   . Adjustment disorder    without depression  . Allergy    allergic rhinitis  . Asthma   . Endometriosis   . Former smoker   . SVD (spontaneous vaginal delivery)    x 1  . Thyroid disease    hyperthyroidism / Graves Disease - no treatment needed for years per patient     Social History   Socioeconomic History  . Marital status: Single    Spouse name: Not on file  . Number of children: 1  . Years of education: Not on file  . Highest education level: Not on file  Occupational History  . Occupation: Programmer, multimedia: [employer]  Social Needs  . Financial resource strain: Not on file  . Food insecurity:    Worry: Not on file    Inability: Not on file  . Transportation needs:    Medical: Not on file    Non-medical: Not on file  Tobacco Use  . Smoking status: Former Smoker    Packs/day: 0.50    Years: 15.00    Pack years: 7.50    Types: Cigarettes    Last attempt to quit: 07/19/2011    Years since quitting: 6.4  . Smokeless tobacco: Never Used  Substance and Sexual Activity  . Alcohol use: Yes    Comment: socially  . Drug use: No  . Sexual activity: Yes    Birth control/protection:  Pill    Comment: on loestrin   Lifestyle  . Physical activity:    Days per week: Not on file    Minutes per session: Not on file  . Stress: Not on file  Relationships  . Social connections:    Talks on phone: Not on file    Gets together: Not on file    Attends religious service: Not on file    Active member of club or organization: Not on file    Attends meetings of clubs or organizations: Not on file    Relationship status: Not on file  . Intimate partner violence:    Fear of current or ex partner: Not on file    Emotionally abused: Not on file    Physically abused: Not on file    Forced sexual activity: Not on file  Other Topics Concern  . Not on file  Social History Narrative  . Not on file    Past Surgical History:  Procedure Laterality Date  . BREAST SURGERY  2004   augmentation  . DILITATION & CURRETTAGE/HYSTROSCOPY WITH HYDROTHERMAL ABLATION N/A 12/21/2014   Procedure: DILATATION & CURETTAGE/HYSTEROSCOPY WITH HYDROTHERMAL ABLATION;  Surgeon: Dian Queen, MD;  Location: [facility];  Service: Gynecology;  Laterality: N/A;  . LAPAROSCOPIC TUBAL LIGATION Bilateral 12/21/2014   Procedure: LAPAROSCOPIC TUBAL LIGATION;  Surgeon: Dian Queen, MD;  Location: [facility];  Service: Gynecology;  Laterality: Bilateral;  . right ankle surgery  2002  . TUBAL LIGATION  12/21/14  . WISDOM TOOTH EXTRACTION      Family History  Problem Relation Age of Onset  . Diabetes Other   . Stroke Other   . Hypertension Other   . Thyroid disease Other   . Migraines Other     Allergies  Allergen Reactions  . Escitalopram Oxalate Other (See Comments)    Causes tremors.  . Montelukast Sodium Swelling    Causes swelling and pain in the joints.  . Wellbutrin [Bupropion] Other (See Comments)    Did well on a low dose but higher dose caused depression.  . Prednisone Anxiety and Other (See Comments)    Hot flashes, emotional upset    Current Outpatient Medications on File Prior to Visit    Medication Sig Dispense Refill  . albuterol (PROVENTIL HFA;VENTOLIN HFA) 108 (90 Base) MCG/ACT inhaler Inhale 2 puffs into the lungs every 6 (six) hours as needed for wheezing or shortness of breath. 1 Inhaler 2  . dexmethylphenidate (FOCALIN XR) 20 MG 24 hr capsule Take 1 capsule (20 mg total) by mouth daily. 30 capsule 0  . fexofenadine (ALLEGRA) 180 MG tablet Take 180 mg by mouth daily as needed.     . fluticasone (FLONASE) 50 MCG/ACT nasal spray Place 2 sprays into both nostrils daily. 16 g 6  . Multiple Vitamin (MULTIVITAMIN) tablet Take 1 tablet by mouth daily.    . [DISCONTINUED] Norethindrone-Ethinyl Estradiol-Fe Biphas (LO LOESTRIN FE) 1 MG-10 MCG / 10 MCG tablet Take 1 tablet by mouth daily.       No current facility-administered medications on file prior to visit.     BP 111/69 (BP Location: Right Arm, Patient Position: Sitting, Cuff Size: Small)   Pulse (!) 58   Temp 98.2 F (36.8 C) (Oral)   Resp 16   Ht 5\' 6"  (1.676 m)   Wt 183 lb 12.8 oz (83.4 kg)   SpO2 100%   BMI 29.67 kg/m       Objective:   Physical Exam  Constitutional: She appears well-developed and well-nourished.  HENT:  Right Ear: Tympanic membrane normal.  Left Ear: Tympanic membrane normal.  Cardiovascular: Normal rate, regular rhythm and normal heart sounds.  No murmur heard. Pulmonary/Chest: Effort normal and breath sounds normal. No respiratory distress. She has no wheezes.  Psychiatric: She has a normal mood and affect. Her behavior is normal. Judgment and thought content normal.          Assessment & Plan:  ADHD- fair control. Continue Focalin, re-evaluate in 3 months.  Bloodborne pathogen exposure- check rpr, Acute hep panel, HIV RNA. Plan to repeat HIV in 3 months.  Low risk due to lack of mucous membrane exposure.  Anxiety/depression- stable. Monitor.   Allergic rhinitis/asthma- stable on current regimen, continue same.

## 2017-12-15 LAB — PAIN MGMT, PROFILE 8 W/CONF, U
6 Acetylmorphine: NEGATIVE ng/mL (ref <10)
AMPHETAMINES: NEGATIVE ng/mL (ref <500)
Alcohol Metabolites: NEGATIVE ng/mL (ref <500)
BUPRENORPHINE, URINE: NEGATIVE ng/mL (ref <5)
Benzodiazepines: NEGATIVE ng/mL (ref <100)
CREATININE: 43.8 mg/dL
Cocaine Metabolite: NEGATIVE ng/mL (ref <150)
MDMA: NEGATIVE ng/mL (ref <500)
Marijuana Metabolite: NEGATIVE ng/mL (ref <20)
Opiates: NEGATIVE ng/mL (ref <100)
Oxidant: NEGATIVE ug/mL (ref <200)
Oxycodone: NEGATIVE ng/mL (ref <100)
PH: 6.43 (ref 4.5–9.0)

## 2017-12-15 LAB — HEPATITIS PANEL, ACUTE
HEP B S AG: NONREACTIVE
HEP C AB: NONREACTIVE
Hep A IgM: NONREACTIVE
Hep B C IgM: NONREACTIVE
SIGNAL TO CUT-OFF: 0.03 (ref <1.00)

## 2017-12-15 LAB — RPR: RPR: NONREACTIVE

## 2017-12-16 ENCOUNTER — Encounter: Payer: Self-pay | Admitting: Family

## 2017-12-16 MED FILL — SOLOSEC 2 GM PACK: 2 | 30 days supply | Qty: 1 | Fill #3

## 2017-12-17 DIAGNOSIS — M25572 Pain in left ankle and joints of left foot: Secondary | ICD-10-CM | POA: Diagnosis not present

## 2017-12-17 DIAGNOSIS — M545 Low back pain: Secondary | ICD-10-CM | POA: Diagnosis not present

## 2017-12-18 LAB — TEST AUTHORIZATION

## 2017-12-19 LAB — HIV-1 RNA, QUALITATIVE, TMA: HIV-1 RNA, Qualitative, TMA: NOT DETECTED

## 2017-12-19 LAB — DRUG MONITORING, METHYLPHENIDATE METABOLITE, QUANTITATIVE: Ritalinic Acid: 1889 ng/mL — ABNORMAL HIGH (ref <100)

## 2017-12-28 ENCOUNTER — Other Ambulatory Visit: Payer: Self-pay | Admitting: Family

## 2017-12-28 DIAGNOSIS — Z79899 Other long term (current) drug therapy: Secondary | ICD-10-CM

## 2017-12-29 MED ORDER — DEXMETHYLPHENIDATE HCL ER 20 MG PO CP24: 20.0000 mg | ORAL_CAPSULE | Freq: Every day | ORAL | 0 refills | Status: DC

## 2017-12-29 NOTE — Telephone Encounter (Signed)
Last RX: 11/27/17, #30 Last OV: 12/14/17 Next OV: 03/16/18 UDS: 12/14/17 CSC: 12/05/16, past due CSR: No discrepancies identified

## 2017-12-29 NOTE — Telephone Encounter (Signed)
Controlled substance contract signed on 4/20 at her last visit. rx sent.

## 2017-12-30 MED FILL — DEXMETHYLPHENIDATE ER 20 MG: 20 | 30 days supply | Qty: 30 | Fill #0

## 2017-12-30 NOTE — Telephone Encounter (Signed)
Received call back from [facility] that they do not have Contract from 11/2017, only see the one from 11/2016. Please advise?

## 2017-12-30 NOTE — Telephone Encounter (Signed)
I saw that contract was printed that day but no notation was made by the lab that pt gave them the contract and it has not been scanned into Epic yet. Called [facility] and spoke with Varney Biles, she will check for document and call me back.

## 2017-12-30 NOTE — Telephone Encounter (Signed)
Let's leave one at front desk for patient to sign when she can please.

## 2018-01-04 NOTE — Telephone Encounter (Signed)
Author phoned pt. to relay Debbrah Alar, NP's order for [facility] and UDS before additional refills of focalin are given. No answer, so author left VM regarding need to come into the office to sign a contract, with [phone] call back number given.

## 2018-01-18 ENCOUNTER — Ambulatory Visit: Payer: 59 | Admitting: Sports Medicine

## 2018-01-18 ENCOUNTER — Encounter: Payer: Self-pay | Admitting: Sports Medicine

## 2018-01-18 VITALS — BP 106/72 | Ht 66.0 in | Wt 186.0 lb

## 2018-01-18 DIAGNOSIS — M25561 Pain in right knee: Secondary | ICD-10-CM | POA: Diagnosis not present

## 2018-01-18 NOTE — Progress Notes (Signed)
   Subjective:    Patient ID: [name], female    DOB: 09-17-1970, 47 y.o.   MRN: [id number]  HPI 47 yo white female runner presents with worsening R inferomedial knee pain since mid March. She was at the end of a 16 weeks traiing cycle for a half marathon. 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 Has been having pain R knee with push off on stairs. She reports falling on her L knee on a trail run in January and persistent L lateral foot pain and thinks she may be compensating for this and contributing the the R knee pain. She had increased from 15 miles a week to 20-30. She has not run in a week due to pain. Not taking any meds. She uses K tape and a pull on sleeve type brace which helps some of the time. She also ices, occasionally uses [employer] and occasional motrin at bedtime. She reports mild swelling at times.    Review of Systems As above    Objective:   Physical Exam Seated on exam table in NAD Inspection of BLE is WNL aside from light purple discoloration from previous injury inferior to the L knee. NL ROM of B knees, ankles and feet with no crepitus  R knee is not visibly swollen. Tender to palpation along the medial joint line. No tenderness along the lateral joint line NL strength of B knees R knee stable, negative anterior and posterior drawer sign, no crepitus/ locking/ catching/ popping Negative Thessaly's test  L foot is not visibly swollen Mild-moderately TTP over proximal L lateral dorsum of foot. NL strength of B ankles and feet L foot and ankle stable  BLEs NVI  BS US performed with limited images obtained. No effusion.       Assessment & Plan:  Likely experiencing some R medial meniscus irritation. Compensation from L foot pain may be playing a causative role. Assessed orthotics which seem too wide for current running shoe. (She states shoes are an 11 and she usually wears a 9.5.) Will give green inserts.  PT exercises to strengthen hip abductors. F/u 4 weeks for re-eval. Consider imaging at that time if no improvement.

## 2018-01-20 ENCOUNTER — Other Ambulatory Visit: Payer: 59

## 2018-01-20 ENCOUNTER — Encounter: Payer: Self-pay | Admitting: *Deleted

## 2018-01-20 NOTE — Telephone Encounter (Addendum)
Pt came in to the office and signed [place]. Pt will not need to complete UDS today.

## 2018-01-20 NOTE — Addendum Note (Signed)
Addended by: Kelle Darting A on: 01/20/2018 05:07 PM   Modules accepted: Orders

## 2018-02-01 ENCOUNTER — Other Ambulatory Visit: Payer: Self-pay | Admitting: Family

## 2018-02-01 DIAGNOSIS — F909 Attention-deficit hyperactivity disorder, unspecified type: Secondary | ICD-10-CM

## 2018-02-01 MED ORDER — DEXMETHYLPHENIDATE HCL ER 20 MG PO CP24: 20.0000 mg | ORAL_CAPSULE | Freq: Every day | ORAL | 0 refills | Status: DC

## 2018-02-01 NOTE — Telephone Encounter (Signed)
Requesting: Focalin XR 20mg  daily Contract: yes, 2019 UDS: 12/14/17 Last OV: 12/14/17 Next Ov: 03/04/18 Last refill: 12/29/17 Database: no discrepancies found.

## 2018-02-04 DIAGNOSIS — N760 Acute vaginitis: Secondary | ICD-10-CM | POA: Diagnosis not present

## 2018-02-04 DIAGNOSIS — Z803 Family history of malignant neoplasm of breast: Secondary | ICD-10-CM | POA: Diagnosis not present

## 2018-02-04 DIAGNOSIS — Z01419 Encounter for gynecological examination (general) (routine) without abnormal findings: Secondary | ICD-10-CM | POA: Diagnosis not present

## 2018-02-04 DIAGNOSIS — Z808 Family history of malignant neoplasm of other organs or systems: Secondary | ICD-10-CM | POA: Diagnosis not present

## 2018-02-04 DIAGNOSIS — Z6829 Body mass index (BMI) 29.0-29.9, adult: Secondary | ICD-10-CM | POA: Diagnosis not present

## 2018-02-04 DIAGNOSIS — Z1212 Encounter for screening for malignant neoplasm of rectum: Secondary | ICD-10-CM | POA: Diagnosis not present

## 2018-02-04 MED FILL — metroNIDAZOLE 500 MG TABS: 500 | 7 days supply | Qty: 14 | Fill #0

## 2018-02-04 MED FILL — FLUCONAZOLE 150 MG TABS: 150 | 1 days supply | Qty: 1 | Fill #0

## 2018-02-09 LAB — HM PAP SMEAR: HM Pap smear: NEGATIVE

## 2018-02-10 MED FILL — DEXMETHYLPHENIDATE ER 20 MG: 20 | 30 days supply | Qty: 30 | Fill #0

## 2018-02-15 ENCOUNTER — Ambulatory Visit: Payer: 59 | Admitting: Sports Medicine

## 2018-02-15 VITALS — BP 104/70 | Ht 66.0 in | Wt 181.0 lb

## 2018-02-15 DIAGNOSIS — M25561 Pain in right knee: Secondary | ICD-10-CM

## 2018-02-15 NOTE — Progress Notes (Signed)
   Subjective:    Patient ID: [name], female    DOB: [birth year], 47 y.o.   MRN: [id number]  HPI   Patient comes in today for follow-up on right knee pain. She is still having pain despite a home exercise program. She's been unable to return to running. Her pain is diffuse. She does endorse intermittent swelling and stiffness. No locking or catching.Symptoms have been present now since February. Overall, she has not noted any significant improvement over the past 4 weeks.   Review of Systems    as above Objective:   Physical Exam  Well-developed, well-nourished. No acute distress. Awake alert and oriented 3. Vital signs reviewed  Right knee: Range of motion 0-120. Trace effusion. She is tender to palpation along the medial joint line. Slight tenderness along the lateral joint line. Knee is stable ligamentous exam. Equivocal Thessaly's. Neurovascularly intact distally.      Assessment & Plan:   Persistent right knee pain worrisome for early DJD versus degenerative meniscal tear  MRI of the right knee. Phone follow-up with those results when available. We'll delineate further treatment based on those findings. In the meantime, continue with home exercises strengthening the right knee and hip.

## 2018-02-16 ENCOUNTER — Ambulatory Visit: Payer: 59 | Admitting: Sports Medicine

## 2018-02-23 ENCOUNTER — Inpatient Hospital Stay: Admission: RE | Admit: 2018-02-23 | Payer: 59 | Source: Ambulatory Visit

## 2018-03-04 ENCOUNTER — Encounter: Payer: 59 | Admitting: Family

## 2018-03-14 ENCOUNTER — Other Ambulatory Visit: Payer: Self-pay | Admitting: Family

## 2018-03-14 DIAGNOSIS — F909 Attention-deficit hyperactivity disorder, unspecified type: Secondary | ICD-10-CM

## 2018-03-15 MED ORDER — DEXMETHYLPHENIDATE HCL ER 20 MG PO CP24: 20.0000 mg | ORAL_CAPSULE | Freq: Every day | ORAL | 0 refills | Status: DC

## 2018-03-15 NOTE — Telephone Encounter (Signed)
Last focalin RX: 02/01/18, #30 Last OV: 12/14/17 Next OV: 03/24/18 UDS: 12/14/17 CSC: 01/20/18 CSR: No discrepancies identified

## 2018-03-16 ENCOUNTER — Ambulatory Visit: Payer: 59 | Admitting: Family

## 2018-03-16 MED FILL — DEXMETHYLPHENIDATE ER 20 MG: 20 | 30 days supply | Qty: 30 | Fill #0

## 2018-03-24 ENCOUNTER — Encounter: Payer: 59 | Admitting: Family

## 2018-03-24 ENCOUNTER — Ambulatory Visit
Admission: RE | Admit: 2018-03-24 | Discharge: 2018-03-24 | Disposition: A | Discharge status: Home / Self Care | Payer: 59 | Source: Ambulatory Visit | Attending: Sports Medicine | Admitting: Sports Medicine

## 2018-03-24 DIAGNOSIS — M1711 Unilateral primary osteoarthritis, right knee: Secondary | ICD-10-CM | POA: Diagnosis not present

## 2018-03-24 DIAGNOSIS — M25561 Pain in right knee: Secondary | ICD-10-CM

## 2018-03-25 DIAGNOSIS — M9902 Segmental and somatic dysfunction of thoracic region: Secondary | ICD-10-CM | POA: Diagnosis not present

## 2018-03-25 DIAGNOSIS — M9905 Segmental and somatic dysfunction of pelvic region: Secondary | ICD-10-CM | POA: Diagnosis not present

## 2018-03-25 DIAGNOSIS — M222X1 Patellofemoral disorders, right knee: Secondary | ICD-10-CM | POA: Diagnosis not present

## 2018-03-25 DIAGNOSIS — M7912 Myalgia of auxiliary muscles, head and neck: Secondary | ICD-10-CM | POA: Diagnosis not present

## 2018-03-25 DIAGNOSIS — M545 Low back pain: Secondary | ICD-10-CM | POA: Diagnosis not present

## 2018-03-25 DIAGNOSIS — M7918 Myalgia, other site: Secondary | ICD-10-CM | POA: Diagnosis not present

## 2018-03-25 DIAGNOSIS — Z1231 Encounter for screening mammogram for malignant neoplasm of breast: Secondary | ICD-10-CM | POA: Diagnosis not present

## 2018-03-25 DIAGNOSIS — M542 Cervicalgia: Secondary | ICD-10-CM | POA: Diagnosis not present

## 2018-03-25 DIAGNOSIS — Z809 Family history of malignant neoplasm, unspecified: Secondary | ICD-10-CM | POA: Diagnosis not present

## 2018-03-25 DIAGNOSIS — Z13820 Encounter for screening for osteoporosis: Secondary | ICD-10-CM | POA: Diagnosis not present

## 2018-03-25 DIAGNOSIS — M9901 Segmental and somatic dysfunction of cervical region: Secondary | ICD-10-CM | POA: Diagnosis not present

## 2018-03-25 DIAGNOSIS — M9903 Segmental and somatic dysfunction of lumbar region: Secondary | ICD-10-CM | POA: Diagnosis not present

## 2018-03-25 DIAGNOSIS — N958 Other specified menopausal and perimenopausal disorders: Secondary | ICD-10-CM | POA: Diagnosis not present

## 2018-03-29 ENCOUNTER — Other Ambulatory Visit: Payer: Self-pay | Admitting: Obstetrics and Gynecology

## 2018-03-29 DIAGNOSIS — R928 Other abnormal and inconclusive findings on diagnostic imaging of breast: Secondary | ICD-10-CM

## 2018-03-30 ENCOUNTER — Ambulatory Visit: Payer: 59 | Admitting: Sports Medicine

## 2018-03-30 ENCOUNTER — Encounter: Payer: Self-pay | Admitting: Family

## 2018-03-30 ENCOUNTER — Ambulatory Visit (INDEPENDENT_AMBULATORY_CARE_PROVIDER_SITE_OTHER): Payer: 59 | Admitting: Family

## 2018-03-30 VITALS — BP 110/66 | HR 67 | Temp 98.5°F | Resp 16 | Ht 66.5 in | Wt 180.0 lb

## 2018-03-30 DIAGNOSIS — Z0000 Encounter for general adult medical examination without abnormal findings: Secondary | ICD-10-CM

## 2018-03-30 LAB — URINALYSIS, ROUTINE W REFLEX MICROSCOPIC
Bilirubin Urine: NEGATIVE
Hgb urine dipstick: NEGATIVE
Ketones, ur: NEGATIVE
Leukocytes, UA: NEGATIVE
Nitrite: NEGATIVE
RBC / HPF: NONE SEEN
Specific Gravity, Urine: >=1.03 — AB (ref 1.000–1.030)
Total Protein, Urine: NEGATIVE
Urine Glucose: NEGATIVE
Urobilinogen, UA: 0.2 (ref 0.0–1.0)
pH: 6 (ref 5.0–8.0)

## 2018-03-30 LAB — CBC WITH DIFFERENTIAL/PLATELET
Basophils Absolute: 0 K/uL (ref 0.0–0.1)
Basophils Relative: 1.1 % (ref 0.0–3.0)
Eosinophils Absolute: 0.1 K/uL (ref 0.0–0.7)
Eosinophils Relative: 2.6 % (ref 0.0–5.0)
HCT: 38.2 % (ref 36.0–46.0)
Hemoglobin: 12.9 g/dL (ref 12.0–15.0)
Lymphocytes Relative: 37.7 % (ref 12.0–46.0)
Lymphs Abs: 1.4 K/uL (ref 0.7–4.0)
MCHC: 33.9 g/dL (ref 30.0–36.0)
MCV: 96 fl (ref 78.0–100.0)
Monocytes Absolute: 0.3 K/uL (ref 0.1–1.0)
Monocytes Relative: 8 % (ref 3.0–12.0)
Neutro Abs: 1.8 K/uL (ref 1.4–7.7)
Neutrophils Relative %: 50.6 % (ref 43.0–77.0)
Platelets: 182 K/uL (ref 150.0–400.0)
RBC: 3.98 Mil/uL (ref 3.87–5.11)
RDW: 12.7 % (ref 11.5–15.5)
WBC: 3.6 K/uL — ABNORMAL LOW (ref 4.0–10.5)

## 2018-03-30 LAB — BASIC METABOLIC PANEL WITH GFR
BUN: 18 mg/dL (ref 6–23)
CO2: 32 meq/L (ref 19–32)
Calcium: 9.5 mg/dL (ref 8.4–10.5)
Chloride: 104 meq/L (ref 96–112)
Creatinine, Ser: 1.05 mg/dL (ref 0.40–1.20)
GFR: 60.01 mL/min
Glucose, Bld: 82 mg/dL (ref 70–99)
Potassium: 4.2 meq/L (ref 3.5–5.1)
Sodium: 140 meq/L (ref 135–145)

## 2018-03-30 LAB — LIPID PANEL
Cholesterol: 131 mg/dL (ref 0–200)
HDL: 61 mg/dL
LDL Cholesterol: 51 mg/dL (ref 0–99)
NonHDL: 70.45
Total CHOL/HDL Ratio: 2
Triglycerides: 95 mg/dL (ref 0.0–149.0)
VLDL: 19 mg/dL (ref 0.0–40.0)

## 2018-03-30 LAB — HEPATIC FUNCTION PANEL
ALT: 11 U/L (ref 0–35)
AST: 17 U/L (ref 0–37)
Albumin: 4.2 g/dL (ref 3.5–5.2)
Alkaline Phosphatase: 44 U/L (ref 39–117)
Bilirubin, Direct: 0.1 mg/dL (ref 0.0–0.3)
Total Bilirubin: 0.5 mg/dL (ref 0.2–1.2)
Total Protein: 6.8 g/dL (ref 6.0–8.3)

## 2018-03-30 LAB — TSH: TSH: 2.88 u[IU]/mL (ref 0.35–4.50)

## 2018-03-30 NOTE — Progress Notes (Signed)
Subjective:    Patient ID: [name], female    DOB: 09/13/1970, 47 y.o.   MRN: [id number]  HPI  Patient presents today for complete physical.  Immunizations:  Up to date, will bet flu shot from employer Diet: joined weight watchers Wt Readings from Last 3 Encounters:  03/30/18 180 lb (81.6 kg)  02/15/18 181 lb (82.1 kg)  01/18/18 186 lb (84.4 kg)  Exercise: running, did 2 half marathons this year Pap Smear: 02/04/18 Mammogram: up to date   Continues focalin- stable.   Review of Systems  Constitutional: Negative for unexpected weight change.  HENT: Negative for hearing loss and rhinorrhea.   Eyes: Negative for visual disturbance.  Respiratory: Negative for cough and shortness of breath.   Cardiovascular: Negative for chest pain.  Gastrointestinal: Negative for diarrhea.       Some constipation which she attributes to diet  Genitourinary: Negative for dysuria and frequency.  Musculoskeletal: Negative for arthralgias and myalgias.  Skin: Negative for rash.  Neurological: Negative for headaches.  Hematological: Negative for adenopathy.  Psychiatric/Behavioral:       Denies depression/anxiety   Past Medical History:  Diagnosis Date  . ADD (attention deficit disorder)   . Adjustment disorder    without depression  . Allergy    allergic rhinitis  . Asthma   . Endometriosis   . Former smoker   . SVD (spontaneous vaginal delivery)    x 1  . Thyroid disease    hyperthyroidism / Graves Disease - no treatment needed for years per patient     Social History   Socioeconomic History  . Marital status: Single    Spouse name: Not on file  . Number of children: 1  . Years of education: Not on file  . Highest education level: Not on file  Occupational History  . Occupation: Programmer, multimedia: [employer]  Social Needs  . Financial resource strain: Not on file  . Food insecurity:    Worry: Not on file    Inability: Not on file  . Transportation needs:    Medical: Not on file    Non-medical: Not on file  Tobacco Use  . Smoking status: Former Smoker    Packs/day: 0.50    Years: 15.00    Pack years: 7.50    Types: Cigarettes    Last attempt to quit: 07/19/2011    Years since quitting: 6.7  . Smokeless tobacco: Never Used  Substance and Sexual Activity  . Alcohol use: Yes    Comment: socially  . Drug use: No  . Sexual activity: Yes    Birth control/protection: Pill    Comment: on loestrin   Lifestyle  . Physical activity:    Days per week: Not on file    Minutes per session: Not on file  . Stress: Not on file  Relationships  . Social connections:    Talks on phone: Not on file    Gets together: Not on file    Attends religious service: Not on file    Active member of club or organization: Not on file    Attends meetings of clubs or organizations: Not on file    Relationship status: Not on file  . Intimate partner violence:    Fear of current or ex partner: Not on file    Emotionally abused: Not on file    Physically abused: Not on file    Forced sexual activity: Not on file  Other  Topics Concern  . Not on file  Social History Narrative  . Not on file    Past Surgical History:  Procedure Laterality Date  . BREAST SURGERY  2004   augmentation  . DILITATION & CURRETTAGE/HYSTROSCOPY WITH HYDROTHERMAL ABLATION N/A 12/21/2014   Procedure: DILATATION & CURETTAGE/HYSTEROSCOPY WITH HYDROTHERMAL ABLATION;  Surgeon: Dian Queen, MD;  Location: [facility];  Service: Gynecology;  Laterality: N/A;  . LAPAROSCOPIC TUBAL LIGATION Bilateral 12/21/2014   Procedure: LAPAROSCOPIC TUBAL LIGATION;  Surgeon: Dian Queen, MD;  Location: [facility];  Service: Gynecology;  Laterality: Bilateral;  . right ankle surgery  2002  . TUBAL LIGATION  12/21/14  . WISDOM TOOTH EXTRACTION      Family History  Problem Relation Age of Onset  . Diabetes Other   . Stroke Other   . Hypertension Other   . Thyroid disease Other   . Migraines Other      Allergies  Allergen Reactions  . Escitalopram Oxalate Other (See Comments)    Causes tremors.  . Montelukast Sodium Swelling    Causes swelling and pain in the joints.  . Wellbutrin [Bupropion] Other (See Comments)    Did well on a low dose but higher dose caused depression.  . Prednisone Anxiety and Other (See Comments)    Hot flashes, emotional upset    Current Outpatient Medications on File Prior to Visit  Medication Sig Dispense Refill  . albuterol (PROVENTIL HFA;VENTOLIN HFA) 108 (90 Base) MCG/ACT inhaler Inhale 2 puffs into the lungs every 6 (six) hours as needed for wheezing or shortness of breath. 1 Inhaler 2  . dexmethylphenidate (FOCALIN XR) 20 MG 24 hr capsule Take 1 capsule (20 mg total) by mouth daily. 30 capsule 0  . fexofenadine (ALLEGRA) 180 MG tablet Take 180 mg by mouth daily as needed.     . fluticasone (FLONASE) 50 MCG/ACT nasal spray Place 2 sprays into both nostrils daily. 16 g 6  . Multiple Vitamin (MULTIVITAMIN) tablet Take 1 tablet by mouth daily.    . [DISCONTINUED] Norethindrone-Ethinyl Estradiol-Fe Biphas (LO LOESTRIN FE) 1 MG-10 MCG / 10 MCG tablet Take 1 tablet by mouth daily.       No current facility-administered medications on file prior to visit.     BP 110/66 (BP Location: Right Arm, Patient Position: Sitting, Cuff Size: Small)   Pulse 67   Temp 98.5 F (36.9 C) (Oral)   Resp 16   Ht 5' 6.5" (1.689 m)   Wt 180 lb (81.6 kg)   SpO2 98%   BMI 28.62 kg/m       Objective:   Physical Exam  Physical Exam  Constitutional: She is oriented to person, place, and time. She appears well-developed and well-nourished. No distress.  HENT:  Head: Normocephalic and atraumatic.  Right Ear: Tympanic membrane and ear canal normal.  Left Ear: Tympanic membrane and ear canal normal.  Mouth/Throat: Oropharynx is clear and moist.  Eyes: Pupils are equal, round, and reactive to light. No scleral icterus.  Neck: Normal range of motion. No thyromegaly  present.  Cardiovascular: Normal rate and regular rhythm.   No murmur heard. Pulmonary/Chest: Effort normal and breath sounds normal. No respiratory distress. He has no wheezes. She has no rales. She exhibits no tenderness.  Abdominal: Soft. Bowel sounds are normal. She exhibits no distension and no mass. There is no tenderness. There is no rebound and no guarding.  Musculoskeletal: She exhibits no edema.  Lymphadenopathy:    She has no cervical adenopathy.  Neurological:  She is alert and oriented to person, place, and time. She has normal patellar reflexes. She exhibits normal muscle tone. Coordination normal.  Skin: Skin is warm and dry.  Psychiatric: She has a normal mood and affect. Her behavior is normal. Judgment and thought content normal.  Breast/pelvic: deferred           Assessment & Plan:   Preventative care- discussed healthy diet, exercise, weight loss.  Obtain routine lab work. Pap/mammo up to date.      Assessment & Plan:  EKG tracing is personally reviewed.  EKG notes NSR.  No acute changes.

## 2018-03-30 NOTE — Patient Instructions (Addendum)
Please complete lab work prior to leaving.  Follow up in 6 months.  

## 2018-04-05 ENCOUNTER — Encounter: Payer: Self-pay | Admitting: Sports Medicine

## 2018-04-05 ENCOUNTER — Ambulatory Visit: Payer: 59 | Admitting: Sports Medicine

## 2018-04-05 VITALS — BP 118/70 | Ht 66.5 in | Wt 177.0 lb

## 2018-04-05 DIAGNOSIS — M2241 Chondromalacia patellae, right knee: Secondary | ICD-10-CM | POA: Diagnosis not present

## 2018-04-05 NOTE — Progress Notes (Signed)
  [name] comes in today to discuss MRI findings of her right knee. MRI shows some mild chondromalacia patella and some mild medial compartmental DJD. No evidence of meniscal tear. She is feeling pretty good. I've reassured her of her findings. I think she can continue with activity using pain as her guide, including running. I also explained that we could do a cortisone injection in the future if her symptoms once again worsen. I would like for her to start doing isometric quad exercises 4-5 times a week. She can simply add this into her normal workout. She has custom orthotics for her running shoes. Follow-up with me as needed.

## 2018-04-06 ENCOUNTER — Ambulatory Visit
Admission: RE | Admit: 2018-04-06 | Discharge: 2018-04-06 | Disposition: A | Discharge status: Home / Self Care | Payer: 59 | Source: Ambulatory Visit | Attending: Obstetrics and Gynecology | Admitting: Obstetrics and Gynecology

## 2018-04-06 ENCOUNTER — Other Ambulatory Visit: Payer: Self-pay | Admitting: Obstetrics and Gynecology

## 2018-04-06 DIAGNOSIS — R928 Other abnormal and inconclusive findings on diagnostic imaging of breast: Secondary | ICD-10-CM

## 2018-04-06 DIAGNOSIS — N6489 Other specified disorders of breast: Secondary | ICD-10-CM

## 2018-04-06 DIAGNOSIS — R922 Inconclusive mammogram: Secondary | ICD-10-CM | POA: Diagnosis not present

## 2018-04-29 ENCOUNTER — Other Ambulatory Visit: Payer: Self-pay | Admitting: Family

## 2018-04-29 DIAGNOSIS — F909 Attention-deficit hyperactivity disorder, unspecified type: Secondary | ICD-10-CM

## 2018-04-30 NOTE — Telephone Encounter (Signed)
Last focalin RX: 03/15/18, #30 Last OV: 03/30/18 Next OV: 09/30/18 UDS: 12/14/17 CSC: 01/20/18 CSR: No discrepancies identified

## 2018-05-02 MED ORDER — DEXMETHYLPHENIDATE HCL ER 20 MG PO CP24: 20.0000 mg | ORAL_CAPSULE | Freq: Every day | ORAL | 0 refills | Status: DC

## 2018-05-03 MED FILL — DEXMETHYLPHENIDATE ER 20 MG: 20 | 30 days supply | Qty: 30 | Fill #0

## 2018-06-08 ENCOUNTER — Other Ambulatory Visit: Payer: Self-pay | Admitting: Family

## 2018-06-08 DIAGNOSIS — F909 Attention-deficit hyperactivity disorder, unspecified type: Secondary | ICD-10-CM

## 2018-06-08 NOTE — Telephone Encounter (Signed)
Last Focalin RX: 05/02/18, #30 Last OV: 03/30/18 Next OV: due in 6 months. Not yet scheduled. UDS: 12/14/17 CSC: 01/20/18 CSR: No discrepancies identified

## 2018-06-09 MED ORDER — DEXMETHYLPHENIDATE HCL ER 20 MG PO CP24: 20.0000 mg | ORAL_CAPSULE | Freq: Every day | ORAL | 0 refills | Status: DC

## 2018-06-09 MED FILL — DEXMETHYLPHENIDATE HCL ER 2: 20 | 30 days supply | Qty: 30 | Fill #0

## 2018-07-13 DIAGNOSIS — M9901 Segmental and somatic dysfunction of cervical region: Secondary | ICD-10-CM | POA: Diagnosis not present

## 2018-07-13 DIAGNOSIS — M25551 Pain in right hip: Secondary | ICD-10-CM | POA: Diagnosis not present

## 2018-07-13 DIAGNOSIS — M545 Low back pain: Secondary | ICD-10-CM | POA: Diagnosis not present

## 2018-07-13 DIAGNOSIS — S76911A Strain of unspecified muscles, fascia and tendons at thigh level, right thigh, initial encounter: Secondary | ICD-10-CM | POA: Diagnosis not present

## 2018-07-13 DIAGNOSIS — M9902 Segmental and somatic dysfunction of thoracic region: Secondary | ICD-10-CM | POA: Diagnosis not present

## 2018-07-13 DIAGNOSIS — M7912 Myalgia of auxiliary muscles, head and neck: Secondary | ICD-10-CM | POA: Diagnosis not present

## 2018-07-13 DIAGNOSIS — M7918 Myalgia, other site: Secondary | ICD-10-CM | POA: Diagnosis not present

## 2018-07-13 DIAGNOSIS — M9903 Segmental and somatic dysfunction of lumbar region: Secondary | ICD-10-CM | POA: Diagnosis not present

## 2018-07-13 DIAGNOSIS — M9905 Segmental and somatic dysfunction of pelvic region: Secondary | ICD-10-CM | POA: Diagnosis not present

## 2018-07-18 ENCOUNTER — Other Ambulatory Visit: Payer: Self-pay | Admitting: Family

## 2018-07-18 DIAGNOSIS — F909 Attention-deficit hyperactivity disorder, unspecified type: Secondary | ICD-10-CM

## 2018-07-21 DIAGNOSIS — M9905 Segmental and somatic dysfunction of pelvic region: Secondary | ICD-10-CM | POA: Diagnosis not present

## 2018-07-21 DIAGNOSIS — M9901 Segmental and somatic dysfunction of cervical region: Secondary | ICD-10-CM | POA: Diagnosis not present

## 2018-07-21 DIAGNOSIS — S76911A Strain of unspecified muscles, fascia and tendons at thigh level, right thigh, initial encounter: Secondary | ICD-10-CM | POA: Diagnosis not present

## 2018-07-21 DIAGNOSIS — M7918 Myalgia, other site: Secondary | ICD-10-CM | POA: Diagnosis not present

## 2018-07-21 DIAGNOSIS — M545 Low back pain: Secondary | ICD-10-CM | POA: Diagnosis not present

## 2018-07-21 DIAGNOSIS — M7912 Myalgia of auxiliary muscles, head and neck: Secondary | ICD-10-CM | POA: Diagnosis not present

## 2018-07-21 DIAGNOSIS — M9903 Segmental and somatic dysfunction of lumbar region: Secondary | ICD-10-CM | POA: Diagnosis not present

## 2018-07-21 DIAGNOSIS — M9902 Segmental and somatic dysfunction of thoracic region: Secondary | ICD-10-CM | POA: Diagnosis not present

## 2018-07-21 DIAGNOSIS — M25551 Pain in right hip: Secondary | ICD-10-CM | POA: Diagnosis not present

## 2018-07-22 ENCOUNTER — Other Ambulatory Visit: Payer: Self-pay | Admitting: Family

## 2018-07-22 DIAGNOSIS — F909 Attention-deficit hyperactivity disorder, unspecified type: Secondary | ICD-10-CM

## 2018-07-23 ENCOUNTER — Telehealth: Payer: Self-pay

## 2018-07-23 MED ORDER — DEXMETHYLPHENIDATE HCL ER 20 MG PO CP24: 20.0000 mg | ORAL_CAPSULE | Freq: Every day | ORAL | 0 refills | Status: DC

## 2018-07-23 MED FILL — DEXMETHYLPHENIDATE HCL ER 2: 20 | 30 days supply | Qty: 30 | Fill #0

## 2018-07-23 NOTE — Telephone Encounter (Signed)
Copied from [place] [id number]. Topic: General - Other >> Jul 23, 2018  2:01 PM Mcneil, Ja-Kwan wrote: Reason for CRM: Patient called to check status of refill request for dexmethylphenidate (FOCALIN XR) 20 MG 24 hr capsule. Patient states she hopes to be able to get the mediation by Monday 07/26/18.

## 2018-07-26 DIAGNOSIS — M545 Low back pain: Secondary | ICD-10-CM | POA: Diagnosis not present

## 2018-07-26 DIAGNOSIS — S76911A Strain of unspecified muscles, fascia and tendons at thigh level, right thigh, initial encounter: Secondary | ICD-10-CM | POA: Diagnosis not present

## 2018-07-26 DIAGNOSIS — M7912 Myalgia of auxiliary muscles, head and neck: Secondary | ICD-10-CM | POA: Diagnosis not present

## 2018-07-26 DIAGNOSIS — M25551 Pain in right hip: Secondary | ICD-10-CM | POA: Diagnosis not present

## 2018-07-26 DIAGNOSIS — M9902 Segmental and somatic dysfunction of thoracic region: Secondary | ICD-10-CM | POA: Diagnosis not present

## 2018-07-26 DIAGNOSIS — M9901 Segmental and somatic dysfunction of cervical region: Secondary | ICD-10-CM | POA: Diagnosis not present

## 2018-07-26 DIAGNOSIS — M9903 Segmental and somatic dysfunction of lumbar region: Secondary | ICD-10-CM | POA: Diagnosis not present

## 2018-07-26 DIAGNOSIS — M9905 Segmental and somatic dysfunction of pelvic region: Secondary | ICD-10-CM | POA: Diagnosis not present

## 2018-07-26 DIAGNOSIS — M7918 Myalgia, other site: Secondary | ICD-10-CM | POA: Diagnosis not present

## 2018-07-27 NOTE — Telephone Encounter (Signed)
Medication sent 07/23/18

## 2018-07-30 DIAGNOSIS — N760 Acute vaginitis: Secondary | ICD-10-CM | POA: Diagnosis not present

## 2018-07-30 DIAGNOSIS — A630 Anogenital (venereal) warts: Secondary | ICD-10-CM | POA: Diagnosis not present

## 2018-07-30 MED FILL — FLUCONAZOLE 150 MG TABS: 150 | 1 days supply | Qty: 1 | Fill #0

## 2018-08-04 DIAGNOSIS — M7918 Myalgia, other site: Secondary | ICD-10-CM | POA: Diagnosis not present

## 2018-08-04 DIAGNOSIS — M9901 Segmental and somatic dysfunction of cervical region: Secondary | ICD-10-CM | POA: Diagnosis not present

## 2018-08-04 DIAGNOSIS — M9903 Segmental and somatic dysfunction of lumbar region: Secondary | ICD-10-CM | POA: Diagnosis not present

## 2018-08-04 DIAGNOSIS — M7912 Myalgia of auxiliary muscles, head and neck: Secondary | ICD-10-CM | POA: Diagnosis not present

## 2018-08-04 DIAGNOSIS — M545 Low back pain: Secondary | ICD-10-CM | POA: Diagnosis not present

## 2018-08-04 DIAGNOSIS — M9902 Segmental and somatic dysfunction of thoracic region: Secondary | ICD-10-CM | POA: Diagnosis not present

## 2018-08-04 DIAGNOSIS — S76911A Strain of unspecified muscles, fascia and tendons at thigh level, right thigh, initial encounter: Secondary | ICD-10-CM | POA: Diagnosis not present

## 2018-08-04 DIAGNOSIS — M25551 Pain in right hip: Secondary | ICD-10-CM | POA: Diagnosis not present

## 2018-08-04 DIAGNOSIS — M9905 Segmental and somatic dysfunction of pelvic region: Secondary | ICD-10-CM | POA: Diagnosis not present

## 2018-08-05 ENCOUNTER — Other Ambulatory Visit: Payer: Self-pay | Admitting: Chiropractic Medicine

## 2018-08-05 ENCOUNTER — Encounter: Payer: Self-pay | Admitting: Family

## 2018-08-05 DIAGNOSIS — M25551 Pain in right hip: Secondary | ICD-10-CM

## 2018-08-12 DIAGNOSIS — M9903 Segmental and somatic dysfunction of lumbar region: Secondary | ICD-10-CM | POA: Diagnosis not present

## 2018-08-12 DIAGNOSIS — M9901 Segmental and somatic dysfunction of cervical region: Secondary | ICD-10-CM | POA: Diagnosis not present

## 2018-08-12 DIAGNOSIS — M25551 Pain in right hip: Secondary | ICD-10-CM | POA: Diagnosis not present

## 2018-08-12 DIAGNOSIS — M7918 Myalgia, other site: Secondary | ICD-10-CM | POA: Diagnosis not present

## 2018-08-12 DIAGNOSIS — M9902 Segmental and somatic dysfunction of thoracic region: Secondary | ICD-10-CM | POA: Diagnosis not present

## 2018-08-12 DIAGNOSIS — M7912 Myalgia of auxiliary muscles, head and neck: Secondary | ICD-10-CM | POA: Diagnosis not present

## 2018-08-12 DIAGNOSIS — M9905 Segmental and somatic dysfunction of pelvic region: Secondary | ICD-10-CM | POA: Diagnosis not present

## 2018-08-12 DIAGNOSIS — M545 Low back pain: Secondary | ICD-10-CM | POA: Diagnosis not present

## 2018-08-12 DIAGNOSIS — S76911A Strain of unspecified muscles, fascia and tendons at thigh level, right thigh, initial encounter: Secondary | ICD-10-CM | POA: Diagnosis not present

## 2018-08-25 ENCOUNTER — Ambulatory Visit
Admission: RE | Admit: 2018-08-25 | Discharge: 2018-08-25 | Disposition: A | Discharge status: Home / Self Care | Payer: 59 | Source: Ambulatory Visit | Attending: Chiropractic Medicine | Admitting: Chiropractic Medicine

## 2018-08-25 DIAGNOSIS — M1611 Unilateral primary osteoarthritis, right hip: Secondary | ICD-10-CM | POA: Diagnosis not present

## 2018-08-25 DIAGNOSIS — M25551 Pain in right hip: Secondary | ICD-10-CM

## 2018-08-25 MED ORDER — GADOBENATE DIMEGLUMINE 529 MG/ML IV SOLN
15.0000 mL | Freq: Once | INTRAVENOUS | Status: AC | PRN
  Administered 2018-08-25: 15 mL via INTRAVENOUS

## 2018-08-26 ENCOUNTER — Other Ambulatory Visit: Payer: Self-pay | Admitting: Family

## 2018-08-26 DIAGNOSIS — F909 Attention-deficit hyperactivity disorder, unspecified type: Secondary | ICD-10-CM

## 2018-08-26 MED ORDER — DEXMETHYLPHENIDATE HCL ER 20 MG PO CP24: 20.0000 mg | ORAL_CAPSULE | Freq: Every day | ORAL | 0 refills | Status: DC

## 2018-08-26 MED FILL — DEXMETHYLPHENIDATE HCL ER 2: 20 | 30 days supply | Qty: 30 | Fill #0

## 2018-09-07 DIAGNOSIS — M9905 Segmental and somatic dysfunction of pelvic region: Secondary | ICD-10-CM | POA: Diagnosis not present

## 2018-09-07 DIAGNOSIS — M9902 Segmental and somatic dysfunction of thoracic region: Secondary | ICD-10-CM | POA: Diagnosis not present

## 2018-09-07 DIAGNOSIS — M25551 Pain in right hip: Secondary | ICD-10-CM | POA: Diagnosis not present

## 2018-09-07 DIAGNOSIS — S76911A Strain of unspecified muscles, fascia and tendons at thigh level, right thigh, initial encounter: Secondary | ICD-10-CM | POA: Diagnosis not present

## 2018-09-07 DIAGNOSIS — M7918 Myalgia, other site: Secondary | ICD-10-CM | POA: Diagnosis not present

## 2018-09-07 DIAGNOSIS — M545 Low back pain: Secondary | ICD-10-CM | POA: Diagnosis not present

## 2018-09-07 DIAGNOSIS — M7912 Myalgia of auxiliary muscles, head and neck: Secondary | ICD-10-CM | POA: Diagnosis not present

## 2018-09-07 DIAGNOSIS — M9901 Segmental and somatic dysfunction of cervical region: Secondary | ICD-10-CM | POA: Diagnosis not present

## 2018-09-07 DIAGNOSIS — M9903 Segmental and somatic dysfunction of lumbar region: Secondary | ICD-10-CM | POA: Diagnosis not present

## 2018-09-27 DIAGNOSIS — M7912 Myalgia of auxiliary muscles, head and neck: Secondary | ICD-10-CM | POA: Diagnosis not present

## 2018-09-27 DIAGNOSIS — M9905 Segmental and somatic dysfunction of pelvic region: Secondary | ICD-10-CM | POA: Diagnosis not present

## 2018-09-27 DIAGNOSIS — M9901 Segmental and somatic dysfunction of cervical region: Secondary | ICD-10-CM | POA: Diagnosis not present

## 2018-09-27 DIAGNOSIS — M25551 Pain in right hip: Secondary | ICD-10-CM | POA: Diagnosis not present

## 2018-09-27 DIAGNOSIS — M9903 Segmental and somatic dysfunction of lumbar region: Secondary | ICD-10-CM | POA: Diagnosis not present

## 2018-09-27 DIAGNOSIS — M545 Low back pain: Secondary | ICD-10-CM | POA: Diagnosis not present

## 2018-09-27 DIAGNOSIS — M9902 Segmental and somatic dysfunction of thoracic region: Secondary | ICD-10-CM | POA: Diagnosis not present

## 2018-09-27 DIAGNOSIS — M7918 Myalgia, other site: Secondary | ICD-10-CM | POA: Diagnosis not present

## 2018-09-27 DIAGNOSIS — S76911A Strain of unspecified muscles, fascia and tendons at thigh level, right thigh, initial encounter: Secondary | ICD-10-CM | POA: Diagnosis not present

## 2018-10-01 ENCOUNTER — Other Ambulatory Visit: Payer: Self-pay | Admitting: Family

## 2018-10-01 DIAGNOSIS — F909 Attention-deficit hyperactivity disorder, unspecified type: Secondary | ICD-10-CM

## 2018-10-01 MED ORDER — DEXMETHYLPHENIDATE HCL ER 20 MG PO CP24: 20.0000 mg | ORAL_CAPSULE | Freq: Every day | ORAL | 0 refills | Status: DC

## 2018-10-01 MED FILL — DEXMETHYLPHENIDATE HCL ER 2: 20 | 30 days supply | Qty: 30 | Fill #0

## 2018-10-01 NOTE — Telephone Encounter (Signed)
Refill sent but pt needs follow up prior to additional refills.

## 2018-10-08 ENCOUNTER — Ambulatory Visit
Admission: RE | Admit: 2018-10-08 | Discharge: 2018-10-08 | Disposition: A | Discharge status: Home / Self Care | Payer: 59 | Source: Ambulatory Visit | Attending: Obstetrics and Gynecology | Admitting: Obstetrics and Gynecology

## 2018-10-08 ENCOUNTER — Other Ambulatory Visit: Payer: 59

## 2018-10-08 ENCOUNTER — Other Ambulatory Visit: Payer: Self-pay | Admitting: Obstetrics and Gynecology

## 2018-10-08 DIAGNOSIS — N6489 Other specified disorders of breast: Secondary | ICD-10-CM

## 2018-10-08 DIAGNOSIS — R922 Inconclusive mammogram: Secondary | ICD-10-CM | POA: Diagnosis not present

## 2018-10-09 ENCOUNTER — Telehealth: Payer: 59 | Admitting: Nurse Practitioner

## 2018-10-09 DIAGNOSIS — N3000 Acute cystitis without hematuria: Secondary | ICD-10-CM | POA: Diagnosis not present

## 2018-10-09 MED ORDER — FLUCONAZOLE 150 MG PO TABS: 150.0000 mg | ORAL_TABLET | Freq: Once | ORAL | 0 refills | Status: AC

## 2018-10-09 MED ORDER — CEPHALEXIN 500 MG PO CAPS: 500.0000 mg | ORAL_CAPSULE | Freq: Two times a day (BID) | ORAL | 0 refills | Status: DC

## 2018-10-09 NOTE — Progress Notes (Signed)
We are sorry that you are not feeling well.  Here is how we plan to help!  Based on what you shared with me it looks like you most likely have a simple urinary tract infection.  A UTI (Urinary Tract Infection) is a bacterial infection of the bladder.  Most cases of urinary tract infections are simple to treat but a key part of your care is to encourage you to drink plenty of fluids and watch your symptoms carefully.  I have prescribed Keflex 500 mg twice a day for 7 days.  Your symptoms should gradually improve. Call us if the burning in your urine worsens, you develop worsening fever, back pain or pelvic pain or if your symptoms do not resolve after completing the antibiotic. I also sent in a diflucan 150mg  1 po when needed.  Urinary tract infections can be prevented by drinking plenty of water to keep your body hydrated.  Also be sure when you wipe, wipe from front to back and don't hold it in!  If possible, empty your bladder every 4 hours.  Your e-visit answers were reviewed by a board certified advanced clinical practitioner to complete your personal care plan.  Depending on the condition, your plan could have included both over the counter or prescription medications.  If there is a problem please reply  once you have received a response from your provider.  Your safety is important to [place].  If you have drug allergies check your prescription carefully.    You can use MyChart to ask questions about today's visit, request a non-urgent call back, or ask for a work or school excuse for 24 hours related to this e-Visit. If it has been greater than 24 hours you will need to follow up with your provider, or enter a new e-Visit to address those concerns.   You will get an e-mail in the next two days asking about your experience.  I hope that your e-visit has been valuable and will speed your recovery. Thank you for using e-visits.   5 minutes spent reviewing and documenting in chart.

## 2018-11-01 ENCOUNTER — Other Ambulatory Visit: Payer: Self-pay | Admitting: Family

## 2018-11-01 DIAGNOSIS — F909 Attention-deficit hyperactivity disorder, unspecified type: Secondary | ICD-10-CM

## 2018-11-01 MED ORDER — DEXMETHYLPHENIDATE HCL ER 20 MG PO CP24: 20.0000 mg | ORAL_CAPSULE | Freq: Every day | ORAL | 0 refills | Status: DC

## 2018-11-01 MED FILL — DEXMETHYLPHENIDATE HCL ER 2: 20 | 30 days supply | Qty: 30 | Fill #0

## 2018-11-01 NOTE — Telephone Encounter (Signed)
See mychart.  

## 2018-12-02 ENCOUNTER — Other Ambulatory Visit: Payer: Self-pay | Admitting: Family

## 2018-12-02 DIAGNOSIS — F909 Attention-deficit hyperactivity disorder, unspecified type: Secondary | ICD-10-CM

## 2018-12-03 ENCOUNTER — Ambulatory Visit (INDEPENDENT_AMBULATORY_CARE_PROVIDER_SITE_OTHER): Payer: 59 | Admitting: Family

## 2018-12-03 DIAGNOSIS — J302 Other seasonal allergic rhinitis: Secondary | ICD-10-CM | POA: Diagnosis not present

## 2018-12-03 DIAGNOSIS — F909 Attention-deficit hyperactivity disorder, unspecified type: Secondary | ICD-10-CM

## 2018-12-03 DIAGNOSIS — F4321 Adjustment disorder with depressed mood: Secondary | ICD-10-CM

## 2018-12-03 MED ORDER — DEXMETHYLPHENIDATE HCL ER 20 MG PO CP24: 20.0000 mg | ORAL_CAPSULE | Freq: Every day | ORAL | 0 refills | Status: DC

## 2018-12-03 NOTE — Progress Notes (Signed)
Virtual Visit via Video Note  I connected with [name] on 12/03/18 at  2:40 PM EDT by a video enabled telemedicine application and verified that I am speaking with the correct person using two identifiers. This visit type was conducted due to national recommendations for restrictions regarding the COVID-19 Pandemic (e.g. social distancing).  This format is felt to be most appropriate for this patient at this time.   I discussed the limitations of evaluation and management by telemedicine and the availability of in person appointments. The patient expressed understanding and agreed to proceed.  Only the patient and myself were on today's video visit. The patient was at home and I was in my office at the time of today's visit.   History of Present Illness:  ADHD- reports that she does well on focalin. Notes that sometimes she skips on the days she does not work and notes that she "does not get anything done" on those days. Able to concentrate well on the days she takes it.   Seasonal allergies- continues allegra. Not currently taking flonase, but plans to start.   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 She reports that her mood was really down initially but she seems to be doing better and understands that her emotions are situational.  Observations/Objective:    Gen: Awake, alert, no acute distress Resp: Breathing is even and non-labored Psych: calm/pleasant demeanor Neuro: Alert and Oriented x 3, + facial symmetry, speech is clear.   Assessment and Plan:  ADHD- stable on focalin, refill provided.   Situational depression- stable.  Will continue to monitor.  Seasonal allergies- fair control on Allegra, pt to add flonase.  Follow Up Instructions:    I discussed the assessment and treatment plan with the patient. The patient was provided an opportunity to  ask questions and all were answered. The patient agreed with the plan and demonstrated an understanding of the instructions.   The patient was advised to call back or seek an in-person evaluation if the symptoms worsen or if the condition fails to improve as anticipated.    Nance Pear, NP

## 2018-12-03 NOTE — Telephone Encounter (Signed)
Appointment scheduled for virtual visit today

## 2018-12-07 ENCOUNTER — Other Ambulatory Visit: Payer: Self-pay | Admitting: Family

## 2018-12-07 DIAGNOSIS — F909 Attention-deficit hyperactivity disorder, unspecified type: Secondary | ICD-10-CM

## 2018-12-08 MED ORDER — DEXMETHYLPHENIDATE HCL ER 20 MG PO CP24: 20.0000 mg | ORAL_CAPSULE | Freq: Every day | ORAL | 0 refills | Status: DC

## 2018-12-08 MED FILL — DEXMETHYLPHENIDATE HCL ER 2: 20 | 30 days supply | Qty: 30 | Fill #0

## 2018-12-08 NOTE — Telephone Encounter (Signed)
Rx has been resent to [facility]. Could you please cancel the rx at [facility]? thanks

## 2018-12-28 DIAGNOSIS — H5213 Myopia, bilateral: Secondary | ICD-10-CM | POA: Diagnosis not present

## 2019-01-01 ENCOUNTER — Telehealth: Payer: 59 | Admitting: Nurse Practitioner

## 2019-01-01 DIAGNOSIS — N3000 Acute cystitis without hematuria: Secondary | ICD-10-CM | POA: Diagnosis not present

## 2019-01-01 MED ORDER — CEPHALEXIN 500 MG PO CAPS: 500.0000 mg | ORAL_CAPSULE | Freq: Two times a day (BID) | ORAL | 0 refills | Status: DC

## 2019-01-01 MED ORDER — FLUCONAZOLE 150 MG PO TABS: 150.0000 mg | ORAL_TABLET | Freq: Once | ORAL | 0 refills | Status: AC

## 2019-01-01 NOTE — Progress Notes (Signed)
We are sorry that you are not feeling well.  Here is how we plan to help!  Based on what you shared with me it looks like you most likely have a simple urinary tract infection.  A UTI (Urinary Tract Infection) is a bacterial infection of the bladder.  Most cases of urinary tract infections are simple to treat but a key part of your care is to encourage you to drink plenty of fluids and watch your symptoms carefully.  I have prescribed Keflex 500 mg twice a day for 7 days.  Your symptoms should gradually improve. Call us if the burning in your urine worsens, you develop worsening fever, back pain or pelvic pain or if your symptoms do not resolve after completing the antibiotic. I also sent in a diflucan 150mg  1 po when needed..  Urinary tract infections can be prevented by drinking plenty of water to keep your body hydrated.  Also be sure when you wipe, wipe from front to back and don't hold it in!  If possible, empty your bladder every 4 hours.  Your e-visit answers were reviewed by a board certified advanced clinical practitioner to complete your personal care plan.  Depending on the condition, your plan could have included both over the counter or prescription medications.  If there is a problem please reply  once you have received a response from your provider.  Your safety is important to [place].  If you have drug allergies check your prescription carefully.    You can use MyChart to ask questions about today's visit, request a non-urgent call back, or ask for a work or school excuse for 24 hours related to this e-Visit. If it has been greater than 24 hours you will need to follow up with your provider, or enter a new e-Visit to address those concerns.   You will get an e-mail in the next two days asking about your experience.  I hope that your e-visit has been valuable and will speed your recovery. Thank you for using e-visits.   5-10 minutes spent reviewing and documenting in chart.

## 2019-01-04 DIAGNOSIS — M5013 Cervical disc disorder with radiculopathy, cervicothoracic region: Secondary | ICD-10-CM | POA: Diagnosis not present

## 2019-01-04 DIAGNOSIS — M25572 Pain in left ankle and joints of left foot: Secondary | ICD-10-CM | POA: Diagnosis not present

## 2019-01-04 DIAGNOSIS — M545 Low back pain: Secondary | ICD-10-CM | POA: Diagnosis not present

## 2019-01-04 DIAGNOSIS — M542 Cervicalgia: Secondary | ICD-10-CM | POA: Diagnosis not present

## 2019-01-07 DIAGNOSIS — M545 Low back pain: Secondary | ICD-10-CM | POA: Diagnosis not present

## 2019-01-07 DIAGNOSIS — M5013 Cervical disc disorder with radiculopathy, cervicothoracic region: Secondary | ICD-10-CM | POA: Diagnosis not present

## 2019-01-07 DIAGNOSIS — M25572 Pain in left ankle and joints of left foot: Secondary | ICD-10-CM | POA: Diagnosis not present

## 2019-01-07 DIAGNOSIS — M542 Cervicalgia: Secondary | ICD-10-CM | POA: Diagnosis not present

## 2019-01-11 DIAGNOSIS — M25572 Pain in left ankle and joints of left foot: Secondary | ICD-10-CM | POA: Diagnosis not present

## 2019-01-11 DIAGNOSIS — M5013 Cervical disc disorder with radiculopathy, cervicothoracic region: Secondary | ICD-10-CM | POA: Diagnosis not present

## 2019-01-11 DIAGNOSIS — M542 Cervicalgia: Secondary | ICD-10-CM | POA: Diagnosis not present

## 2019-01-11 DIAGNOSIS — M545 Low back pain: Secondary | ICD-10-CM | POA: Diagnosis not present

## 2019-01-12 ENCOUNTER — Telehealth (INDEPENDENT_AMBULATORY_CARE_PROVIDER_SITE_OTHER): Payer: 59 | Admitting: Family

## 2019-01-12 DIAGNOSIS — N3000 Acute cystitis without hematuria: Secondary | ICD-10-CM

## 2019-01-12 DIAGNOSIS — F909 Attention-deficit hyperactivity disorder, unspecified type: Secondary | ICD-10-CM | POA: Diagnosis not present

## 2019-01-12 MED ORDER — FLUCONAZOLE 150 MG PO TABS: 150.0000 mg | ORAL_TABLET | Freq: Once | ORAL | 0 refills | Status: AC

## 2019-01-12 MED ORDER — CIPROFLOXACIN HCL 500 MG PO TABS: 500.0000 mg | ORAL_TABLET | Freq: Two times a day (BID) | ORAL | 0 refills | Status: DC

## 2019-01-12 MED ORDER — DEXMETHYLPHENIDATE HCL ER 20 MG PO CP24: 20.0000 mg | ORAL_CAPSULE | Freq: Every day | ORAL | 0 refills | Status: DC

## 2019-01-12 MED FILL — DEXMETHYLPHENIDATE HCL ER 2: 20 | 30 days supply | Qty: 30 | Fill #0

## 2019-01-12 NOTE — Progress Notes (Signed)
Virtual Visit via Video Note  I connected with [name] on 01/12/19 at  9:40 AM EDT by a video enabled telemedicine application and verified that I am speaking with the correct person using two identifiers. This visit type was conducted due to national recommendations for restrictions regarding the COVID-19 Pandemic (e.g. social distancing).  This format is felt to be most appropriate for this patient at this time.   I discussed the limitations of evaluation and management by telemedicine and the availability of in person appointments. The patient expressed understanding and agreed to proceed.  Only the patient and myself were on today's video visit. The patient was at home and I was in my office at the time of today's visit.   History of Present Illness:  Patient is a 48 yr old female who reports that she did an evisit about 10 days ago and was treated with keflex for UTI.  She reports that her symptoms initially resolved.  Had intercourse 2 days ago and then developed recurrent dysuria. 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  ADHD- reports that symptoms are stable as long as she continues her focalin. Requests refill.   Observations/Objective:   Gen: Awake, alert, no acute distress Resp: Breathing is even and non-labored Psych: calm/pleasant demeanor Neuro: Alert and Oriented x 3, + facial symmetry, speech is clear.  Assessment and Plan:  UTI- will rx with cipro. Advised pt to call if symptoms worsen or if symptoms fail to improve. Would need to obtain urine culture at that time. Encouraged her to empty her bladder regularly and ensure adequate hydration.  Also request rx for diflucan to use prn.   ADHD- stable on focalin. Refill sent.   Follow Up Instructions:    I discussed the assessment and treatment plan with the patient. The patient was provided an opportunity to ask questions and  all were answered. The patient agreed with the plan and demonstrated an understanding of the instructions.   The patient was advised to call back or seek an in-person evaluation if the symptoms worsen or if the condition fails to improve as anticipated.    Nance Pear, NP

## 2019-02-15 DIAGNOSIS — A630 Anogenital (venereal) warts: Secondary | ICD-10-CM | POA: Diagnosis not present

## 2019-02-15 DIAGNOSIS — Z6828 Body mass index (BMI) 28.0-28.9, adult: Secondary | ICD-10-CM | POA: Diagnosis not present

## 2019-02-15 DIAGNOSIS — N760 Acute vaginitis: Secondary | ICD-10-CM | POA: Diagnosis not present

## 2019-02-15 DIAGNOSIS — Z01419 Encounter for gynecological examination (general) (routine) without abnormal findings: Secondary | ICD-10-CM | POA: Diagnosis not present

## 2019-02-16 ENCOUNTER — Encounter: Payer: Self-pay | Admitting: Family

## 2019-02-16 ENCOUNTER — Other Ambulatory Visit: Payer: Self-pay | Admitting: Family

## 2019-02-16 DIAGNOSIS — F909 Attention-deficit hyperactivity disorder, unspecified type: Secondary | ICD-10-CM

## 2019-02-16 MED ORDER — DEXMETHYLPHENIDATE HCL ER 20 MG PO CP24: 20.0000 mg | ORAL_CAPSULE | Freq: Every day | ORAL | 0 refills | Status: DC

## 2019-02-16 MED FILL — DEXMETHYLPHENIDATE HCL ER 2: 20 | 30 days supply | Qty: 30 | Fill #0

## 2019-03-07 DIAGNOSIS — M7918 Myalgia, other site: Secondary | ICD-10-CM | POA: Diagnosis not present

## 2019-03-07 DIAGNOSIS — M9905 Segmental and somatic dysfunction of pelvic region: Secondary | ICD-10-CM | POA: Diagnosis not present

## 2019-03-07 DIAGNOSIS — M545 Low back pain: Secondary | ICD-10-CM | POA: Diagnosis not present

## 2019-03-07 DIAGNOSIS — M9902 Segmental and somatic dysfunction of thoracic region: Secondary | ICD-10-CM | POA: Diagnosis not present

## 2019-03-07 DIAGNOSIS — M9901 Segmental and somatic dysfunction of cervical region: Secondary | ICD-10-CM | POA: Diagnosis not present

## 2019-03-07 DIAGNOSIS — M542 Cervicalgia: Secondary | ICD-10-CM | POA: Diagnosis not present

## 2019-03-07 DIAGNOSIS — M7912 Myalgia of auxiliary muscles, head and neck: Secondary | ICD-10-CM | POA: Diagnosis not present

## 2019-03-07 DIAGNOSIS — M25551 Pain in right hip: Secondary | ICD-10-CM | POA: Diagnosis not present

## 2019-03-07 DIAGNOSIS — M9903 Segmental and somatic dysfunction of lumbar region: Secondary | ICD-10-CM | POA: Diagnosis not present

## 2019-03-15 ENCOUNTER — Encounter: Payer: Self-pay | Admitting: Family

## 2019-03-18 ENCOUNTER — Other Ambulatory Visit: Payer: Self-pay

## 2019-03-18 ENCOUNTER — Ambulatory Visit (INDEPENDENT_AMBULATORY_CARE_PROVIDER_SITE_OTHER): Payer: 59 | Admitting: Family Medicine

## 2019-03-18 ENCOUNTER — Encounter: Payer: Self-pay | Admitting: Family Medicine

## 2019-03-18 VITALS — HR 79 | Temp 98.3°F

## 2019-03-18 DIAGNOSIS — U071 COVID-19: Secondary | ICD-10-CM

## 2019-03-18 DIAGNOSIS — J324 Chronic pansinusitis: Secondary | ICD-10-CM | POA: Diagnosis not present

## 2019-03-18 MED ORDER — FLUCONAZOLE 150 MG PO TABS: ORAL_TABLET | ORAL | 0 refills | Status: DC

## 2019-03-18 MED ORDER — FLUTICASONE PROPIONATE 50 MCG/ACT NA SUSP: 2.0000 | Freq: Every day | NASAL | 6 refills | Status: DC

## 2019-03-18 MED ORDER — AMOXICILLIN-POT CLAVULANATE 875-125 MG PO TABS: 1.0000 | ORAL_TABLET | Freq: Two times a day (BID) | ORAL | 0 refills | Status: DC

## 2019-03-18 MED FILL — AMOX-CLAV 875-125 MG TABLET: 875-125 | 10 days supply | Qty: 20 | Fill #0

## 2019-03-18 MED FILL — FLUCONAZOLE 150 MG TABS: 150 | 6 days supply | Qty: 2 | Fill #0

## 2019-03-18 MED FILL — FLUTICASONE PROP 50 MCG SPR: 50 | 30 days supply | Qty: 16 | Fill #0

## 2019-03-18 NOTE — Progress Notes (Signed)
Virtual Visit via Video Note  I connected with [name] on 03/18/19 at  3:15 PM EDT by a video enabled telemedicine application and verified that I am speaking with the correct person using two identifiers.  Location: Patient: home  Provider: office    I discussed the limitations of evaluation and management by telemedicine and the availability of in person appointments. The patient expressed understanding and agreed to proceed.  History of Present Illness: Pt is home with 10 days sinus congestion fever , body aches.  She tested + covid as did her boyfriend after going to a [place]  The body aches are better but she is very congested.  She is taking robitussin DM    Observations/Objective:  Vitals:   03/18/19 1510  Pulse: 79  Temp: 98.3 F (36.8 C)  SpO2: 98%   Pt is in NAD  Assessment and Plan:   1. Pansinusitis, unspecified chronicity flonase and abx Can cont robitussin  Call if symptoms persist  - amoxicillin-clavulanate (AUGMENTIN) 875-125 MG tablet; Take 1 tablet by mouth 2 (two) times daily.  Dispense: 20 tablet; Refill: 0 - fluconazole (DIFLUCAN) 150 MG tablet; 1 po x1, may repeat in 3 days prn  Dispense: 2 tablet; Refill: 0 - fluticasone (FLONASE) 50 MCG/ACT nasal spray; Place 2 sprays into both nostrils daily.  Dispense: 16 g; Refill: 6  2. COVID-19 virus infection Day #10  - MyChart COVID-19 home monitoring program; Future - Temperature monitoring; Future  Follow Up Instructions:    I discussed the assessment and treatment plan with the patient. The patient was provided an opportunity to ask questions and all were answered. The patient agreed with the plan and demonstrated an understanding of the instructions.   The patient was advised to call back or seek an in-person evaluation if the symptoms worsen or if the condition fails to improve as anticipated.  I provided 15 minutes of non-face-to-face time during this encounter.   Ann Held, DO

## 2019-04-08 ENCOUNTER — Other Ambulatory Visit: Payer: Self-pay

## 2019-04-08 ENCOUNTER — Ambulatory Visit
Admission: RE | Admit: 2019-04-08 | Discharge: 2019-04-08 | Disposition: A | Discharge status: Home / Self Care | Payer: 59 | Source: Ambulatory Visit | Attending: Obstetrics and Gynecology | Admitting: Obstetrics and Gynecology

## 2019-04-08 DIAGNOSIS — R922 Inconclusive mammogram: Secondary | ICD-10-CM | POA: Diagnosis not present

## 2019-04-08 DIAGNOSIS — N6489 Other specified disorders of breast: Secondary | ICD-10-CM | POA: Diagnosis not present

## 2019-04-11 ENCOUNTER — Other Ambulatory Visit: Payer: Self-pay | Admitting: Family

## 2019-04-11 DIAGNOSIS — F909 Attention-deficit hyperactivity disorder, unspecified type: Secondary | ICD-10-CM

## 2019-04-13 MED ORDER — DEXMETHYLPHENIDATE HCL ER 20 MG PO CP24: 20.0000 mg | ORAL_CAPSULE | Freq: Every day | ORAL | 0 refills | Status: DC

## 2019-04-13 MED FILL — DEXMETHYLPHENIDATE HCL ER 2: 20 | 30 days supply | Qty: 30 | Fill #0

## 2019-04-20 ENCOUNTER — Other Ambulatory Visit: Payer: Self-pay

## 2019-04-20 ENCOUNTER — Ambulatory Visit (INDEPENDENT_AMBULATORY_CARE_PROVIDER_SITE_OTHER): Payer: 59 | Admitting: Family

## 2019-04-20 ENCOUNTER — Telehealth: Payer: Self-pay | Admitting: Family

## 2019-04-20 DIAGNOSIS — F329 Major depressive disorder, single episode, unspecified: Secondary | ICD-10-CM

## 2019-04-20 DIAGNOSIS — F32A Depression, unspecified: Secondary | ICD-10-CM

## 2019-04-20 MED ORDER — SERTRALINE HCL 50 MG PO TABS: ORAL_TABLET | ORAL | 0 refills | Status: DC

## 2019-04-20 MED FILL — SERTRALINE HCL 50 MG TABLET: 50 | 30 days supply | Qty: 30 | Fill #0

## 2019-04-20 NOTE — Progress Notes (Signed)
Virtual Visit via Video Note  I connected with [name] on 04/20/19 at 12:20 PM EDT by a video enabled telemedicine application and verified that I am speaking with the correct person using two identifiers.  Location: Patient: home Provider: home   I discussed the limitations of evaluation and management by telemedicine and the availability of in person appointments. The patient expressed understanding and agreed to proceed.  History of Present Illness:  Patient is a 48 yr old female who presents today to discuss concerns about depression.   She reports + irritability.  Feels like she is not herself.  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  She reports that she was doing frequent intubations and also witnessed a lot of negative outcomes.  She feels like this is way down her some.  She finds herself being irritable towards her patients at work as well as her family members.  She has spoken to several other friends who also had COVID-19 who notes that they to have been having some significant irritability following COVID-19 infection.  She denies suicidal ideation or homicidal ideation.   She tested positive for COVID-19.  She first developed symptoms on 03/10/2019.  She was later treated for sinusitis on March 18, 2019.  Overall from a COVID-19 standpoint she reports that she has improved with the exception of some ongoing fatigue.  She also notes some sense of brain fog.  Past Medical History:  Diagnosis Date  . ADD (attention deficit disorder)   . Adjustment disorder    without depression  . Allergy    allergic rhinitis  . Asthma   . Endometriosis   . Former smoker   . SVD (spontaneous vaginal delivery)    x 1  . Thyroid disease    hyperthyroidism / Graves Disease - no treatment needed for years per patient     Social History   Socioeconomic History  . Marital status: Single    Spouse name:  Not on file  . Number of children: 1  . Years of education: Not on file  . Highest education level: Not on file  Occupational History  . Occupation: Programmer, multimedia: [employer]  Social Needs  . Financial resource strain: Not on file  . Food insecurity    Worry: Not on file    Inability: Not on file  . Transportation needs    Medical: Not on file    Non-medical: Not on file  Tobacco Use  . Smoking status: Former Smoker    Packs/day: 0.50    Years: 15.00    Pack years: 7.50    Types: Cigarettes    Quit date: 07/19/2011    Years since quitting: 7.7  . Smokeless tobacco: Never Used  Substance and Sexual Activity  . Alcohol use: Yes    Comment: socially  . Drug use: No  . Sexual activity: Yes    Birth control/protection: Pill    Comment: on loestrin   Lifestyle  . Physical activity    Days per week: Not on file    Minutes per session: Not on file  . Stress: Not on file  Relationships  . Social Herbalist on phone: Not on file    Gets together: Not on file    Attends religious service: Not on file    Active member of club or organization: Not on file  Attends meetings of clubs or organizations: Not on file    Relationship status: Not on file  . Intimate partner violence    Fear of current or ex partner: Not on file    Emotionally abused: Not on file    Physically abused: Not on file    Forced sexual activity: Not on file  Other Topics Concern  . Not on file  Social History Narrative  . Not on file    Past Surgical History:  Procedure Laterality Date  . AUGMENTATION MAMMAPLASTY    . BREAST BIOPSY    . BREAST SURGERY  2004   augmentation  . DILITATION & CURRETTAGE/HYSTROSCOPY WITH HYDROTHERMAL ABLATION N/A 12/21/2014   Procedure: DILATATION & CURETTAGE/HYSTEROSCOPY WITH HYDROTHERMAL ABLATION;  Surgeon: Dian Queen, MD;  Location: [facility];  Service: Gynecology;  Laterality: N/A;  . LAPAROSCOPIC TUBAL LIGATION Bilateral 12/21/2014   Procedure:  LAPAROSCOPIC TUBAL LIGATION;  Surgeon: Dian Queen, MD;  Location: [facility];  Service: Gynecology;  Laterality: Bilateral;  . right ankle surgery  2002  . TUBAL LIGATION  12/21/14  . WISDOM TOOTH EXTRACTION      Family History  Problem Relation Age of Onset  . Diabetes Other   . Stroke Other   . Hypertension Other   . Thyroid disease Other   . Migraines Other   . Breast cancer Maternal Aunt     Allergies  Allergen Reactions  . Escitalopram Oxalate Other (See Comments)    Causes tremors.  . Montelukast Sodium Swelling    Causes swelling and pain in the joints.  . Wellbutrin [Bupropion] Other (See Comments)    Did well on a low dose but higher dose caused depression.  . Prednisone Anxiety and Other (See Comments)    Hot flashes, emotional upset    Current Outpatient Medications on File Prior to Visit  Medication Sig Dispense Refill  . albuterol (PROVENTIL HFA;VENTOLIN HFA) 108 (90 Base) MCG/ACT inhaler Inhale 2 puffs into the lungs every 6 (six) hours as needed for wheezing or shortness of breath. 1 Inhaler 2  . amoxicillin-clavulanate (AUGMENTIN) 875-125 MG tablet Take 1 tablet by mouth 2 (two) times daily. 20 tablet 0  . dexmethylphenidate (FOCALIN XR) 20 MG 24 hr capsule Take 1 capsule (20 mg total) by mouth daily. 30 capsule 0  . fexofenadine (ALLEGRA) 180 MG tablet Take 180 mg by mouth daily as needed.     . fluconazole (DIFLUCAN) 150 MG tablet 1 po x1, may repeat in 3 days prn 2 tablet 0  . fluticasone (FLONASE) 50 MCG/ACT nasal spray Place 2 sprays into both nostrils daily. 16 g 6  . fluticasone (FLONASE) 50 MCG/ACT nasal spray Place 2 sprays into both nostrils daily. 16 g 6  . [DISCONTINUED] Norethindrone-Ethinyl Estradiol-Fe Biphas (LO LOESTRIN FE) 1 MG-10 MCG / 10 MCG tablet Take 1 tablet by mouth daily.       No current facility-administered medications on file prior to visit.     There were no vitals taken for this visit.    Observations/Objective:   Gen:  Awake, alert, no acute distress Resp: Breathing is even and non-labored Psych: calm/pleasant demeanor. Intermittent tearfulness. Neuro: Alert and Oriented x 3, + facial symmetry, speech is clear.   Assessment and Plan:  Depression-patient appears to be exhibiting some signs of depression.  I also think she is suffering some from the trauma of her recent job responsibilities and recent illness.  I have suggested that she call with our behavioral health to establish an  appointment with a counselor.  We also discussed initiating Zoloft 50 mg once daily.  I instructed pt to start 1/2 tablet once daily for 1 week and then increase to a full tablet once daily on week two as tolerated.  We discussed common side effects such as nausea, drowsiness and weight gain.  Also discussed rare but serious side effect of suicide ideation.  She is instructed to discontinue medication go directly to ED if this occurs.  Pt verbalizes understanding.  Plan follow up in 1 month to evaluate progress.         Follow Up Instructions:    I discussed the assessment and treatment plan with the patient. The patient was provided an opportunity to ask questions and all were answered. The patient agreed with the plan and demonstrated an understanding of the instructions.   The patient was advised to call back or seek an in-person evaluation if the symptoms worsen or if the condition fails to improve as anticipated.  Nance Pear, NP

## 2019-04-20 NOTE — Telephone Encounter (Signed)
Patient is due for follow up visit. Please contact to schedule.

## 2019-04-21 NOTE — Telephone Encounter (Signed)
Pt schedule 05/17/19

## 2019-05-17 ENCOUNTER — Encounter: Payer: Self-pay | Admitting: Family

## 2019-05-17 ENCOUNTER — Other Ambulatory Visit: Payer: Self-pay

## 2019-05-17 ENCOUNTER — Ambulatory Visit (INDEPENDENT_AMBULATORY_CARE_PROVIDER_SITE_OTHER): Payer: 59 | Admitting: Family

## 2019-05-17 VITALS — HR 76 | Wt 174.6 lb

## 2019-05-17 DIAGNOSIS — F329 Major depressive disorder, single episode, unspecified: Secondary | ICD-10-CM

## 2019-05-17 DIAGNOSIS — F32A Depression, unspecified: Secondary | ICD-10-CM

## 2019-05-17 DIAGNOSIS — F909 Attention-deficit hyperactivity disorder, unspecified type: Secondary | ICD-10-CM | POA: Diagnosis not present

## 2019-05-17 MED ORDER — DEXMETHYLPHENIDATE HCL ER 20 MG PO CP24: 20.0000 mg | ORAL_CAPSULE | Freq: Every day | ORAL | 0 refills | Status: DC

## 2019-05-17 MED ORDER — SERTRALINE HCL 50 MG PO TABS: 50.0000 mg | ORAL_TABLET | Freq: Every day | ORAL | 1 refills | Status: DC

## 2019-05-17 MED FILL — SERTRALINE HCL 50 MG TABLET: 50 | 90 days supply | Qty: 90 | Fill #0

## 2019-05-17 MED FILL — DEXMETHYLPHENIDATE HCL ER 2: 20 | 30 days supply | Qty: 30 | Fill #0

## 2019-05-17 NOTE — Progress Notes (Signed)
Virtual Visit via Video Note  I connected with [name] on 05/17/19 at  9:40 AM EDT by a video enabled telemedicine application and verified that I am speaking with the correct person using two identifiers.  Location: Patient: home Provider: work   I discussed the limitations of evaluation and management by telemedicine and the availability of in person appointments. The patient expressed understanding and agreed to proceed.  History of Present Illness:  Last visit we discussed her concerns about mood and irritability.  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  She also was recovering from COVID-19 herself.  We discussed referral to a counselor and initiated Zoloft.  She reports that she had some mild nausea and insomnia upon initiation of Zoloft.  However, she notes that when she switched the dosing to the a.m. she no longer had sleep issues.  Nausea improved after few weeks.  She notes overall that she is less emotional and has more patience.  Mood is stable and improved.  She does report that she feels a little bit like she has complete lack of emotion or flat affect.  Did note that she became emotional the other day over something which she was pleased about that she was able to elicit an emotion.  Has a new patient apt 10/5 with counselor.   Wt Readings from Last 3 Encounters:  05/17/19 174 lb 9.6 oz (79.2 kg)  04/05/18 177 lb (80.3 kg)  03/30/18 180 lb (81.6 kg)   ADHD-she continues Focalin.  Notes that she is a little bit spacey at times.  Still wonders if it is residual from her recent COVID-19 infection.  She does not wish to make any changes at this time.  Past Medical History:  Diagnosis Date  . ADD (attention deficit disorder)   . Adjustment disorder    without depression  . Allergy    allergic rhinitis  . Asthma   . Endometriosis   . Former smoker   . SVD (spontaneous vaginal delivery)    x 1  .  Thyroid disease    hyperthyroidism / Graves Disease - no treatment needed for years per patient     Social History   Socioeconomic History  . Marital status: Single    Spouse name: Not on file  . Number of children: 1  . Years of education: Not on file  . Highest education level: Not on file  Occupational History  . Occupation: Programmer, multimedia: [employer]  Social Needs  . Financial resource strain: Not on file  . Food insecurity    Worry: Not on file    Inability: Not on file  . Transportation needs    Medical: Not on file    Non-medical: Not on file  Tobacco Use  . Smoking status: Former Smoker    Packs/day: 0.50    Years: 15.00    Pack years: 7.50    Types: Cigarettes    Quit date: 07/19/2011    Years since quitting: 7.8  . Smokeless tobacco: Never Used  Substance and Sexual Activity  . Alcohol use: Yes    Comment: socially  . Drug use: No  . Sexual activity: Yes    Birth control/protection: Pill    Comment: on loestrin   Lifestyle  . Physical activity    Days per week: Not on file    Minutes per session: Not on file  . Stress: Not  on file  Relationships  . Social Herbalist on phone: Not on file    Gets together: Not on file    Attends religious service: Not on file    Active member of club or organization: Not on file    Attends meetings of clubs or organizations: Not on file    Relationship status: Not on file  . Intimate partner violence    Fear of current or ex partner: Not on file    Emotionally abused: Not on file    Physically abused: Not on file    Forced sexual activity: Not on file  Other Topics Concern  . Not on file  Social History Narrative  . Not on file    Past Surgical History:  Procedure Laterality Date  . AUGMENTATION MAMMAPLASTY    . BREAST BIOPSY    . BREAST SURGERY  2004   augmentation  . DILITATION & CURRETTAGE/HYSTROSCOPY WITH HYDROTHERMAL ABLATION N/A 12/21/2014   Procedure: DILATATION &  CURETTAGE/HYSTEROSCOPY WITH HYDROTHERMAL ABLATION;  Surgeon: Dian Queen, MD;  Location: [facility];  Service: Gynecology;  Laterality: N/A;  . LAPAROSCOPIC TUBAL LIGATION Bilateral 12/21/2014   Procedure: LAPAROSCOPIC TUBAL LIGATION;  Surgeon: Dian Queen, MD;  Location: [facility];  Service: Gynecology;  Laterality: Bilateral;  . right ankle surgery  2002  . TUBAL LIGATION  12/21/14  . WISDOM TOOTH EXTRACTION      Family History  Problem Relation Age of Onset  . Diabetes Other   . Stroke Other   . Hypertension Other   . Thyroid disease Other   . Migraines Other   . Breast cancer Maternal Aunt     Allergies  Allergen Reactions  . Escitalopram Oxalate Other (See Comments)    Causes tremors.  . Montelukast Sodium Swelling    Causes swelling and pain in the joints.  . Wellbutrin [Bupropion] Other (See Comments)    Did well on a low dose but higher dose caused depression.  . Prednisone Anxiety and Other (See Comments)    Hot flashes, emotional upset    Current Outpatient Medications on File Prior to Visit  Medication Sig Dispense Refill  . albuterol (PROVENTIL HFA;VENTOLIN HFA) 108 (90 Base) MCG/ACT inhaler Inhale 2 puffs into the lungs every 6 (six) hours as needed for wheezing or shortness of breath. 1 Inhaler 2  . fexofenadine (ALLEGRA) 180 MG tablet Take 180 mg by mouth daily as needed.     . fluticasone (FLONASE) 50 MCG/ACT nasal spray Place 2 sprays into both nostrils daily. 16 g 6  . fluticasone (FLONASE) 50 MCG/ACT nasal spray Place 2 sprays into both nostrils daily. 16 g 6  . [DISCONTINUED] Norethindrone-Ethinyl Estradiol-Fe Biphas (LO LOESTRIN FE) 1 MG-10 MCG / 10 MCG tablet Take 1 tablet by mouth daily.       No current facility-administered medications on file prior to visit.     Pulse 76   Wt 174 lb 9.6 oz (79.2 kg)   SpO2 99%   BMI 27.76 kg/m      Observations/Objective:   Gen: Awake, alert, no acute distress Resp: Breathing is even and non-labored Psych:  calm/pleasant demeanor Neuro: Alert and Oriented x 3, + facial symmetry, speech is clear.   Assessment and Plan:  Depression-stable/improved on Zoloft.  Continue same.  ADHD- fair control on Focalin.  We will continue current dose for now.  If symptoms worsen or fail to improve could consider increasing to 30 mg once daily.  Follow Up Instructions:  I discussed the assessment and treatment plan with the patient. The patient was provided an opportunity to ask questions and all were answered. The patient agreed with the plan and demonstrated an understanding of the instructions.   The patient was advised to call back or seek an in-person evaluation if the symptoms worsen or if the condition fails to improve as anticipated.  Nance Pear, NP

## 2019-05-23 ENCOUNTER — Ambulatory Visit (INDEPENDENT_AMBULATORY_CARE_PROVIDER_SITE_OTHER): Payer: 59 | Admitting: Psychology

## 2019-05-23 DIAGNOSIS — F331 Major depressive disorder, recurrent, moderate: Secondary | ICD-10-CM | POA: Diagnosis not present

## 2019-05-25 ENCOUNTER — Encounter: Payer: Self-pay | Admitting: Family

## 2019-05-26 NOTE — Telephone Encounter (Signed)
Please see mychart message and call pt to schedule a virtual visit. I can see her tomorrow or monday

## 2019-05-28 ENCOUNTER — Telehealth: Payer: 59 | Admitting: Physician Assistant

## 2019-05-28 DIAGNOSIS — R300 Dysuria: Secondary | ICD-10-CM | POA: Diagnosis not present

## 2019-05-28 MED ORDER — FLUCONAZOLE 150 MG PO TABS: 150.0000 mg | ORAL_TABLET | Freq: Once | ORAL | 0 refills | Status: AC

## 2019-05-28 MED ORDER — CEPHALEXIN 500 MG PO CAPS: 500.0000 mg | ORAL_CAPSULE | Freq: Two times a day (BID) | ORAL | 0 refills | Status: AC

## 2019-05-28 NOTE — Progress Notes (Signed)
We are sorry that you are not feeling well.  Here is how we plan to help!  Based on what you shared with me it looks like you most likely have a simple urinary tract infection.  A UTI (Urinary Tract Infection) is a bacterial infection of the bladder.  Most cases of urinary tract infections are simple to treat but a key part of your care is to encourage you to drink plenty of fluids and watch your symptoms carefully.  I have prescribed Keflex 500 mg twice a day for 7 days.  I will send in a single dose of Diflucan to take if symptoms of yeast begin due to antibiotic. Make sure to take a daily probiotic. Your symptoms should gradually improve. Call us if the burning in your urine worsens, you develop worsening fever, back pain or pelvic pain or if your symptoms do not resolve after completing the antibiotic.  Urinary tract infections can be prevented by drinking plenty of water to keep your body hydrated.  Also be sure when you wipe, wipe from front to back and don't hold it in!  If possible, empty your bladder every 4 hours.  Your e-visit answers were reviewed by a board certified advanced clinical practitioner to complete your personal care plan.  Depending on the condition, your plan could have included both over the counter or prescription medications.  If there is a problem please reply  once you have received a response from your provider.  Your safety is important to [place].  If you have drug allergies check your prescription carefully.    You can use MyChart to ask questions about today's visit, request a non-urgent call back, or ask for a work or school excuse for 24 hours related to this e-Visit. If it has been greater than 24 hours you will need to follow up with your provider, or enter a new e-Visit to address those concerns.   You will get an e-mail in the next two days asking about your experience.  I hope that your e-visit has been valuable and will speed your recovery. Thank you for  using e-visits.

## 2019-05-28 NOTE — Progress Notes (Signed)
I have spent 5 minutes in review of e-visit questionnaire, review and updating patient chart, medical decision making and response to patient.   Patti Shorb Cody Laurieann Friddle, PA-C    

## 2019-06-02 ENCOUNTER — Other Ambulatory Visit: Payer: Self-pay

## 2019-06-03 ENCOUNTER — Ambulatory Visit (INDEPENDENT_AMBULATORY_CARE_PROVIDER_SITE_OTHER): Payer: 59 | Admitting: Family

## 2019-06-03 ENCOUNTER — Other Ambulatory Visit: Payer: Self-pay

## 2019-06-03 ENCOUNTER — Encounter: Payer: Self-pay | Admitting: Family

## 2019-06-03 VITALS — BP 111/72 | HR 76 | Temp 97.1°F | Resp 16 | Ht 66.0 in | Wt 177.0 lb

## 2019-06-03 DIAGNOSIS — R232 Flushing: Secondary | ICD-10-CM

## 2019-06-03 DIAGNOSIS — N3000 Acute cystitis without hematuria: Secondary | ICD-10-CM

## 2019-06-03 DIAGNOSIS — Z0000 Encounter for general adult medical examination without abnormal findings: Secondary | ICD-10-CM

## 2019-06-03 DIAGNOSIS — F418 Other specified anxiety disorders: Secondary | ICD-10-CM

## 2019-06-03 LAB — HEPATIC FUNCTION PANEL
ALT: 16 U/L (ref 0–35)
AST: 22 U/L (ref 0–37)
Albumin: 4.9 g/dL (ref 3.5–5.2)
Alkaline Phosphatase: 47 U/L (ref 39–117)
Bilirubin, Direct: 0.1 mg/dL (ref 0.0–0.3)
Total Bilirubin: 0.6 mg/dL (ref 0.2–1.2)
Total Protein: 7.4 g/dL (ref 6.0–8.3)

## 2019-06-03 LAB — CBC WITH DIFFERENTIAL/PLATELET
Basophils Absolute: 0 10*3/uL (ref 0.0–0.1)
Basophils Relative: 1 % (ref 0.0–3.0)
Eosinophils Absolute: 0 10*3/uL (ref 0.0–0.7)
Eosinophils Relative: 0.8 % (ref 0.0–5.0)
HCT: 39.3 % (ref 36.0–46.0)
Hemoglobin: 13.3 g/dL (ref 12.0–15.0)
Lymphocytes Relative: 23.2 % (ref 12.0–46.0)
Lymphs Abs: 1.2 10*3/uL (ref 0.7–4.0)
MCHC: 33.8 g/dL (ref 30.0–36.0)
MCV: 95.1 fl (ref 78.0–100.0)
Monocytes Absolute: 0.3 10*3/uL (ref 0.1–1.0)
Monocytes Relative: 5.4 % (ref 3.0–12.0)
Neutro Abs: 3.4 10*3/uL (ref 1.4–7.7)
Neutrophils Relative %: 69.6 % (ref 43.0–77.0)
Platelets: 209 10*3/uL (ref 150.0–400.0)
RBC: 4.14 Mil/uL (ref 3.87–5.11)
RDW: 13.1 % (ref 11.5–15.5)
WBC: 5 10*3/uL (ref 4.0–10.5)

## 2019-06-03 LAB — LIPID PANEL
Cholesterol: 169 mg/dL (ref 0–200)
HDL: 71.2 mg/dL (ref >39.00)
LDL Cholesterol: 79 mg/dL (ref 0–99)
NonHDL: 97.87
Total CHOL/HDL Ratio: 2
Triglycerides: 96 mg/dL (ref 0.0–149.0)
VLDL: 19.2 mg/dL (ref 0.0–40.0)

## 2019-06-03 LAB — BASIC METABOLIC PANEL
BUN: 17 mg/dL (ref 6–23)
CO2: 30 mEq/L (ref 19–32)
Calcium: 9.8 mg/dL (ref 8.4–10.5)
Chloride: 101 mEq/L (ref 96–112)
Creatinine, Ser: 0.88 mg/dL (ref 0.40–1.20)
GFR: 68.87 mL/min (ref >60.00)
Glucose, Bld: 72 mg/dL (ref 70–99)
Potassium: 4.2 mEq/L (ref 3.5–5.1)
Sodium: 139 mEq/L (ref 135–145)

## 2019-06-03 LAB — LUTEINIZING HORMONE: LH: 6.95 m[IU]/mL

## 2019-06-03 LAB — FOLLICLE STIMULATING HORMONE: FSH: 24.1 m[IU]/mL

## 2019-06-03 LAB — TSH: TSH: 3.24 u[IU]/mL (ref 0.35–4.50)

## 2019-06-03 NOTE — Patient Instructions (Signed)
Please complete lab work prior to leaving.    Preventive Care 40-47 Years Old, Female Preventive care refers to visits with your health care provider and lifestyle choices that can promote health and wellness. This includes:  A yearly physical exam. This may also be called an annual well check.  Regular dental visits and eye exams.  Immunizations.  Screening for certain conditions.  Healthy lifestyle choices, such as eating a healthy diet, getting regular exercise, not using drugs or products that contain nicotine and tobacco, and limiting alcohol use. What can I expect for my preventive care visit? Physical exam Your health care provider will check your:  Height and weight. This may be used to calculate body mass index (BMI), which tells if you are at a healthy weight.  Heart rate and blood pressure.  Skin for abnormal spots. Counseling Your health care provider may ask you questions about your:  Alcohol, tobacco, and drug use.  Emotional well-being.  Home and relationship well-being.  Sexual activity.  Eating habits.  Work and work environment.  Method of birth control.  Menstrual cycle.  Pregnancy history. What immunizations do I need?  Influenza (flu) vaccine  This is recommended every year. Tetanus, diphtheria, and pertussis (Tdap) vaccine  You may need a Td booster every 10 years. Varicella (chickenpox) vaccine  You may need this if you have not been vaccinated. Zoster (shingles) vaccine  You may need this after age 60. Measles, mumps, and rubella (MMR) vaccine  You may need at least one dose of MMR if you were born in 1957 or later. You may also need a second dose. Pneumococcal conjugate (PCV13) vaccine  You may need this if you have certain conditions and were not previously vaccinated. Pneumococcal polysaccharide (PPSV23) vaccine  You may need one or two doses if you smoke cigarettes or if you have certain conditions. Meningococcal conjugate  (MenACWY) vaccine  You may need this if you have certain conditions. Hepatitis A vaccine  You may need this if you have certain conditions or if you travel or work in places where you may be exposed to hepatitis A. Hepatitis B vaccine  You may need this if you have certain conditions or if you travel or work in places where you may be exposed to hepatitis B. Haemophilus influenzae type b (Hib) vaccine  You may need this if you have certain conditions. Human papillomavirus (HPV) vaccine  If recommended by your health care provider, you may need three doses over 6 months. You may receive vaccines as individual doses or as more than one vaccine together in one shot (combination vaccines). Talk with your health care provider about the risks and benefits of combination vaccines. What tests do I need? Blood tests  Lipid and cholesterol levels. These may be checked every 5 years, or more frequently if you are over 50 years old.  Hepatitis C test.  Hepatitis B test. Screening  Lung cancer screening. You may have this screening every year starting at age 55 if you have a 30-pack-year history of smoking and currently smoke or have quit within the past 15 years.  Colorectal cancer screening. All adults should have this screening starting at age 50 and continuing until age 75. Your health care provider may recommend screening at age 45 if you are at increased risk. You will have tests every 1-10 years, depending on your results and the type of screening test.  Diabetes screening. This is done by checking your blood sugar (glucose) after you have   not eaten for a while (fasting). You may have this done every 1-3 years.  Mammogram. This may be done every 1-2 years. Talk with your health care provider about when you should start having regular mammograms. This may depend on whether you have a family history of breast cancer.  BRCA-related cancer screening. This may be done if you have a family  history of breast, ovarian, tubal, or peritoneal cancers.  Pelvic exam and Pap test. This may be done every 3 years starting at age 21. Starting at age 30, this may be done every 5 years if you have a Pap test in combination with an HPV test. Other tests  Sexually transmitted disease (STD) testing.  Bone density scan. This is done to screen for osteoporosis. You may have this scan if you are at high risk for osteoporosis. Follow these instructions at home: Eating and drinking  Eat a diet that includes fresh fruits and vegetables, whole grains, lean protein, and low-fat dairy.  Take vitamin and mineral supplements as recommended by your health care provider.  Do not drink alcohol if: ? Your health care provider tells you not to drink. ? You are pregnant, may be pregnant, or are planning to become pregnant.  If you drink alcohol: ? Limit how much you have to 0-1 drink a day. ? Be aware of how much alcohol is in your drink. In the [place] one drink equals one 12 oz bottle of beer (355 mL), one 5 oz glass of wine (148 mL), or one 1 oz glass of hard liquor (44 mL). Lifestyle  Take daily care of your teeth and gums.  Stay active. Exercise for at least 30 minutes on 5 or more days each week.  Do not use any products that contain nicotine or tobacco, such as cigarettes, e-cigarettes, and chewing tobacco. If you need help quitting, ask your health care provider.  If you are sexually active, practice safe sex. Use a condom or other form of birth control (contraception) in order to prevent pregnancy and STIs (sexually transmitted infections).  If told by your health care provider, take low-dose aspirin daily starting at age 50. What's next?  Visit your health care provider once a year for a well check visit.  Ask your health care provider how often you should have your eyes and teeth checked.  Stay up to date on all vaccines. This information is not intended to replace advice given to you  by your health care provider. Make sure you discuss any questions you have with your health care provider. Document Released: 08/31/2015 Document Revised: 04/15/2018 Document Reviewed: 04/15/2018 Elsevier Patient Education  2020 Elsevier Inc.  

## 2019-06-03 NOTE — Progress Notes (Signed)
Subjective:    Patient ID: [name], female    DOB: 06/16/1971, 48 y.o.   MRN: [id number]  HPI  Patient is a 48 yr old female who presents today for cpx.  Immunizations: flu shot up to date, Dtap 2017 Diet:  Overall diet has been fair, not having time to meal prep Exercise: yoga  Pap Smear: 2016- due, reports that Dr. Candie Mile did pap last visit Mammogram: 04/08/19 Vision: 6/20 Dental: due Wt Readings from Last 3 Encounters:  06/03/19 177 lb (80.3 kg)  05/17/19 174 lb 9.6 oz (79.2 kg)  04/05/18 177 lb (80.3 kg)   Depression- reports that she has weaned off of zoloft.  Reports resolution of the "hollowness" that she previously described.    UTI- using keflex, was given in an ED.  Feels like symptoms are improving.   Hot flashes-  Worse at night.  Does have them sometimes during the day  Review of Systems  Constitutional: Negative for unexpected weight change.  HENT: Negative for hearing loss.   Eyes: Negative for visual disturbance.  Respiratory: Positive for shortness of breath (some mild SOB since she had covid, and mainly only when she is wearing a mask). Negative for cough.   Cardiovascular: Negative for chest pain.  Gastrointestinal: Negative for constipation and diarrhea.  Genitourinary: Negative for dysuria and frequency.  Musculoskeletal: Negative for arthralgias and myalgias.  Skin: Negative for rash.  Neurological: Negative for headaches.  Hematological: Negative for adenopathy.  Psychiatric/Behavioral:       See HPI   Past Medical History:  Diagnosis Date   ADD (attention deficit disorder)    Adjustment disorder    without depression   Allergy    allergic rhinitis   Asthma    Endometriosis    Former smoker    SVD (spontaneous vaginal delivery)    x 1   Thyroid disease    hyperthyroidism / Graves Disease - no treatment needed for years per patient     Social History   Socioeconomic History   Marital status: Single    Spouse name:  Not on file   Number of children: 1   Years of education: Not on file   Highest education level: Not on file  Occupational History   Occupation: Programmer, multimedia: [employer] resource strain: Not on file   Food insecurity    Worry: Not on file    Inability: Not on file   Transportation needs    Medical: Not on file    Non-medical: Not on file  Tobacco Use   Smoking status: Former Smoker    Packs/day: 0.50    Years: 15.00    Pack years: 7.50    Types: Cigarettes    Quit date: 07/19/2011    Years since quitting: 7.8   Smokeless tobacco: Never Used  Substance and Sexual Activity   Alcohol use: Yes    Comment: socially   Drug use: No   Sexual activity: Yes    Birth control/protection: Pill    Comment: on loestrin   Lifestyle   Physical activity    Days per week: Not on file    Minutes per session: Not on file   Stress: Not on file  Relationships   Social connections    Talks on phone: Not on file    Gets together: Not on file    Attends religious service: Not on file    Active member of  club or organization: Not on file    Attends meetings of clubs or organizations: Not on file    Relationship status: Not on file   Intimate partner violence    Fear of current or ex partner: Not on file    Emotionally abused: Not on file    Physically abused: Not on file    Forced sexual activity: Not on file  Other Topics Concern   Not on file  Social History Narrative   Not on file    Past Surgical History:  Procedure Laterality Date   AUGMENTATION MAMMAPLASTY     BREAST BIOPSY     BREAST SURGERY  2004   augmentation   DILITATION & CURRETTAGE/HYSTROSCOPY WITH HYDROTHERMAL ABLATION N/A 12/21/2014   Procedure: DILATATION & CURETTAGE/HYSTEROSCOPY WITH HYDROTHERMAL ABLATION;  Surgeon: Dian Queen, MD;  Location: [facility];  Service: Gynecology;  Laterality: N/A;   LAPAROSCOPIC TUBAL LIGATION Bilateral 12/21/2014   Procedure:  LAPAROSCOPIC TUBAL LIGATION;  Surgeon: Dian Queen, MD;  Location: [facility];  Service: Gynecology;  Laterality: Bilateral;   right ankle surgery  2002   TUBAL LIGATION  12/21/14   WISDOM TOOTH EXTRACTION      Family History  Problem Relation Age of Onset   Diabetes Other    Stroke Other    Hypertension Other    Thyroid disease Other    Migraines Other    Breast cancer Maternal Aunt     Allergies  Allergen Reactions   Escitalopram Oxalate Other (See Comments)    Causes tremors.   Montelukast Sodium Swelling    Causes swelling and pain in the joints.   Paroxetine Hcl    Wellbutrin [Bupropion] Other (See Comments)    Did well on a low dose but higher dose caused depression.   Prednisone Anxiety and Other (See Comments)    Hot flashes, emotional upset    Current Outpatient Medications on File Prior to Visit  Medication Sig Dispense Refill   albuterol (PROVENTIL HFA;VENTOLIN HFA) 108 (90 Base) MCG/ACT inhaler Inhale 2 puffs into the lungs every 6 (six) hours as needed for wheezing or shortness of breath. 1 Inhaler 2   cephALEXin (KEFLEX) 500 MG capsule Take 1 capsule (500 mg total) by mouth 2 (two) times daily for 7 days. 14 capsule 0   dexmethylphenidate (FOCALIN XR) 20 MG 24 hr capsule Take 1 capsule (20 mg total) by mouth daily. 30 capsule 0   fexofenadine (ALLEGRA) 180 MG tablet Take 180 mg by mouth daily as needed.      fluticasone (FLONASE) 50 MCG/ACT nasal spray Place 2 sprays into both nostrils daily. 16 g 6   [DISCONTINUED] Norethindrone-Ethinyl Estradiol-Fe Biphas (LO LOESTRIN FE) 1 MG-10 MCG / 10 MCG tablet Take 1 tablet by mouth daily.       No current facility-administered medications on file prior to visit.     BP 111/72 (BP Location: Right Arm, Patient Position: Sitting, Cuff Size: Small)    Pulse 76    Temp (!) 97.1 F (36.2 C) (Temporal)    Resp 16    Ht 5\' 6"  (1.676 m)    Wt 177 lb (80.3 kg)    SpO2 99%    BMI 28.57 kg/m       Objective:    Physical Exam  Physical Exam  Constitutional: She is oriented to person, place, and time. She appears well-developed and well-nourished. No distress.  HENT:  Head: Normocephalic and atraumatic.  Right Ear: Tympanic membrane and ear canal normal.  Left  Ear: Tympanic membrane and ear canal normal.  Mouth/Throat: deferred- pt wearing a mask for covid precautions  Eyes: Pupils are equal, round, and reactive to light. No scleral icterus.  Neck: Normal range of motion. No thyromegaly present.  Cardiovascular: Normal rate and regular rhythm.   No murmur heard. Pulmonary/Chest: Effort normal and breath sounds normal. No respiratory distress. He has no wheezes. She has no rales. She exhibits no tenderness.  Abdominal: Soft. Bowel sounds are normal. She exhibits no distension and no mass. There is no tenderness. There is no rebound and no guarding.  Musculoskeletal: She exhibits no edema.  Lymphadenopathy:    She has no cervical adenopathy.  Neurological: She is alert and oriented to person, place, and time. She exhibits normal muscle tone. Coordination normal.  Skin: Skin is warm and dry.  Psychiatric: She has a normal mood and affect. Her behavior is normal. Judgment and thought content normal.  Breast/pelvic: deferred       Assessment & Plan:   Preventative care- discussed healthy diet, exercise. Flu shot and tetanus up to date.  Mammo and pap up to date, will request pap results from her GYN,  Obtain routine lab work.  Hot flashes- check FSH, LH, estradiol  Depression/anxiety- currently stable off of zoloft. Pt continues to follow with her counselor. She will let me know if symptoms worsen,  UTI- clinically improving on keflex.  Continue same.       Assessment & Plan:

## 2019-06-04 LAB — ESTRADIOL: Estradiol: 79 pg/mL

## 2019-06-06 ENCOUNTER — Ambulatory Visit (INDEPENDENT_AMBULATORY_CARE_PROVIDER_SITE_OTHER): Payer: 59 | Admitting: Psychology

## 2019-06-06 ENCOUNTER — Encounter: Payer: Self-pay | Admitting: Family

## 2019-06-06 DIAGNOSIS — F331 Major depressive disorder, recurrent, moderate: Secondary | ICD-10-CM

## 2019-06-18 ENCOUNTER — Telehealth: Payer: 59 | Admitting: Nurse Practitioner

## 2019-06-18 DIAGNOSIS — R21 Rash and other nonspecific skin eruption: Secondary | ICD-10-CM

## 2019-06-18 NOTE — Progress Notes (Signed)
Based on what you shared with me it looks like you have rash,that should be evaluated in a face to face office visit. Ringworm is usually not that wide spread even though  that is what it looks like in your pictures. A dermatologist needs to look at it to make sure that is what you have. I have nothing else I can prescribe in and evisit. Antibiotics are not used to treat fungal rashes, it does not help.     NOTE: If you entered your credit card information for this eVisit, you will not be charged. You may see a "hold" on your card for the $35 but that hold will drop off and you will not have a charge processed.  If you are having a true medical emergency please call 911.     For an urgent face to face visit, [place] has four urgent care centers for your convenience:   . [facility]    [phone]                  Get Driving Directions  [id number] [place], [place] [address] . 10 am to 8 pm Monday-Friday . 12 pm to 8 pm Saturday-Sunday   . [facility] [place]                  Get Driving Directions  [id number] [place], [place], [place] [address] . 8 am to 8 pm Monday-Friday . 9 am to 6 pm Saturday . 11 am to 6 pm Sunday   . [facility] [place]                  Get Driving Directions   [address] [place], [place] [address] . 8 am to 8 pm Monday-Friday . 8 am to 4 pm Saturday-Sunday    . [facility] [place]                    Get Driving Directions  [id number]  [address] [place], [place] [address]  . Monday-Friday, 12 PM to 6 PM    Your e-visit answers were reviewed by a board certified advanced clinical practitioner to complete your personal care plan.  Thank you for using e-Visits.

## 2019-06-20 ENCOUNTER — Ambulatory Visit (INDEPENDENT_AMBULATORY_CARE_PROVIDER_SITE_OTHER): Payer: 59 | Admitting: Psychology

## 2019-06-20 DIAGNOSIS — F331 Major depressive disorder, recurrent, moderate: Secondary | ICD-10-CM

## 2019-06-22 ENCOUNTER — Encounter: Payer: Self-pay | Admitting: Family

## 2019-06-22 ENCOUNTER — Other Ambulatory Visit: Payer: Self-pay

## 2019-06-22 ENCOUNTER — Ambulatory Visit (INDEPENDENT_AMBULATORY_CARE_PROVIDER_SITE_OTHER): Payer: 59 | Admitting: Family

## 2019-06-22 VITALS — BP 123/71 | HR 62 | Temp 97.1°F | Resp 16 | Wt 176.0 lb

## 2019-06-22 DIAGNOSIS — B354 Tinea corporis: Secondary | ICD-10-CM

## 2019-06-22 DIAGNOSIS — F909 Attention-deficit hyperactivity disorder, unspecified type: Secondary | ICD-10-CM | POA: Diagnosis not present

## 2019-06-22 DIAGNOSIS — M9902 Segmental and somatic dysfunction of thoracic region: Secondary | ICD-10-CM | POA: Diagnosis not present

## 2019-06-22 DIAGNOSIS — M9901 Segmental and somatic dysfunction of cervical region: Secondary | ICD-10-CM | POA: Diagnosis not present

## 2019-06-22 DIAGNOSIS — M542 Cervicalgia: Secondary | ICD-10-CM | POA: Diagnosis not present

## 2019-06-22 DIAGNOSIS — M7912 Myalgia of auxiliary muscles, head and neck: Secondary | ICD-10-CM | POA: Diagnosis not present

## 2019-06-22 DIAGNOSIS — M25551 Pain in right hip: Secondary | ICD-10-CM | POA: Diagnosis not present

## 2019-06-22 DIAGNOSIS — M9905 Segmental and somatic dysfunction of pelvic region: Secondary | ICD-10-CM | POA: Diagnosis not present

## 2019-06-22 DIAGNOSIS — M545 Low back pain: Secondary | ICD-10-CM | POA: Diagnosis not present

## 2019-06-22 DIAGNOSIS — M7918 Myalgia, other site: Secondary | ICD-10-CM | POA: Diagnosis not present

## 2019-06-22 DIAGNOSIS — M9903 Segmental and somatic dysfunction of lumbar region: Secondary | ICD-10-CM | POA: Diagnosis not present

## 2019-06-22 MED ORDER — TERBINAFINE HCL 250 MG PO TABS: 250.0000 mg | ORAL_TABLET | Freq: Every day | ORAL | 0 refills | Status: AC

## 2019-06-22 MED ORDER — DEXMETHYLPHENIDATE HCL ER 20 MG PO CP24: 20.0000 mg | ORAL_CAPSULE | Freq: Every day | ORAL | 0 refills | Status: DC

## 2019-06-22 MED FILL — DEXMETHYLPHENIDATE HCL ER 2: 20 | 30 days supply | Qty: 30 | Fill #0

## 2019-06-22 MED FILL — TERBINAFINE HCL 250 MG TAB: 250 | 14 days supply | Qty: 14 | Fill #0

## 2019-06-22 NOTE — Progress Notes (Signed)
Subjective:    Patient ID: [name], female    DOB: Apr 05, 1971, 48 y.o.   MRN: [id number]  HPI   Patient is a 48 yr old female who presents today with chief complaint of skin rash. Reports that she was without AC for a while.  Reports that she noted mold in her shower.  Also saw mold on her boots.  Bought some new bras.  She is changing towels daily.  Initially noted spot on her anterior chest.  Then developed spot on her abdomen. Has been using ketoconozole without improvement.  Did an E-visit on Saturday and was told she needed face to face.    ADHD- reports symptoms are stable on Focalin. Requests refill.   Review of Systems    see HPI  Past Medical History:  Diagnosis Date  . ADD (attention deficit disorder)   . Adjustment disorder    without depression  . Allergy    allergic rhinitis  . Asthma   . Endometriosis   . Former smoker   . SVD (spontaneous vaginal delivery)    x 1  . Thyroid disease    hyperthyroidism / Graves Disease - no treatment needed for years per patient     Social History   Socioeconomic History  . Marital status: Single    Spouse name: Not on file  . Number of children: 1  . Years of education: Not on file  . Highest education level: Not on file  Occupational History  . Occupation: Programmer, multimedia: [employer]  Social Needs  . Financial resource strain: Not on file  . Food insecurity    Worry: Not on file    Inability: Not on file  . Transportation needs    Medical: Not on file    Non-medical: Not on file  Tobacco Use  . Smoking status: Former Smoker    Packs/day: 0.50    Years: 15.00    Pack years: 7.50    Types: Cigarettes    Quit date: 07/19/2011    Years since quitting: 7.9  . Smokeless tobacco: Never Used  Substance and Sexual Activity  . Alcohol use: Yes    Comment: socially  . Drug use: No  . Sexual activity: Yes    Birth control/protection: Pill    Comment: on loestrin   Lifestyle  . Physical activity     Days per week: Not on file    Minutes per session: Not on file  . Stress: Not on file  Relationships  . Social Herbalist on phone: Not on file    Gets together: Not on file    Attends religious service: Not on file    Active member of club or organization: Not on file    Attends meetings of clubs or organizations: Not on file    Relationship status: Not on file  . Intimate partner violence    Fear of current or ex partner: Not on file    Emotionally abused: Not on file    Physically abused: Not on file    Forced sexual activity: Not on file  Other Topics Concern  . Not on file  Social History Narrative  . Not on file    Past Surgical History:  Procedure Laterality Date  . AUGMENTATION MAMMAPLASTY    . BREAST BIOPSY    . BREAST SURGERY  2004   augmentation  . DILITATION & CURRETTAGE/HYSTROSCOPY WITH HYDROTHERMAL ABLATION N/A 12/21/2014  Procedure: DILATATION & CURETTAGE/HYSTEROSCOPY WITH HYDROTHERMAL ABLATION;  Surgeon: Dian Queen, MD;  Location: [facility];  Service: Gynecology;  Laterality: N/A;  . LAPAROSCOPIC TUBAL LIGATION Bilateral 12/21/2014   Procedure: LAPAROSCOPIC TUBAL LIGATION;  Surgeon: Dian Queen, MD;  Location: [facility];  Service: Gynecology;  Laterality: Bilateral;  . right ankle surgery  2002  . TUBAL LIGATION  12/21/14  . WISDOM TOOTH EXTRACTION      Family History  Problem Relation Age of Onset  . Diabetes Other   . Stroke Other   . Hypertension Other   . Thyroid disease Other   . Migraines Other   . Breast cancer Maternal Aunt     Allergies  Allergen Reactions  . Escitalopram Oxalate Other (See Comments)    Causes tremors.  . Montelukast Sodium Swelling    Causes swelling and pain in the joints.  . Paroxetine Hcl   . Wellbutrin [Bupropion] Other (See Comments)    Did well on a low dose but higher dose caused depression.  . Prednisone Anxiety and Other (See Comments)    Hot flashes, emotional upset    Current Outpatient  Medications on File Prior to Visit  Medication Sig Dispense Refill  . albuterol (PROVENTIL HFA;VENTOLIN HFA) 108 (90 Base) MCG/ACT inhaler Inhale 2 puffs into the lungs every 6 (six) hours as needed for wheezing or shortness of breath. 1 Inhaler 2  . fexofenadine (ALLEGRA) 180 MG tablet Take 180 mg by mouth daily as needed.     . fluticasone (FLONASE) 50 MCG/ACT nasal spray Place 2 sprays into both nostrils daily. 16 g 6  . [DISCONTINUED] Norethindrone-Ethinyl Estradiol-Fe Biphas (LO LOESTRIN FE) 1 MG-10 MCG / 10 MCG tablet Take 1 tablet by mouth daily.       No current facility-administered medications on file prior to visit.     BP 123/71 (BP Location: Right Arm, Patient Position: Sitting, Cuff Size: Small)   Pulse 62   Temp (!) 97.1 F (36.2 C) (Temporal)   Resp 16   Wt 176 lb (79.8 kg)   SpO2 100%   BMI 28.41 kg/m    Objective:   Physical Exam Constitutional:      Appearance: Normal appearance.  Pulmonary:     Effort: Pulmonary effort is normal.  Skin:    Comments: Annular rash noted on abdomen chest wall, mild rash on lower back.   Neurological:     Mental Status: She is alert.           Assessment & Plan:  Tinea corporis-  No improvement with topical antifungals. Will rx with oral lamisil x 14 days.  Pt is advised to call if symptoms worsen or if symptoms fail to improve.  ADHD- stable. Continue focalin.

## 2019-06-22 NOTE — Patient Instructions (Signed)
Please begin lamisil once daily for 2 weeks.  Call if symptoms worsen or if symptoms fail to improve.

## 2019-07-06 ENCOUNTER — Ambulatory Visit: Payer: 59 | Admitting: Psychology

## 2019-07-06 ENCOUNTER — Ambulatory Visit (INDEPENDENT_AMBULATORY_CARE_PROVIDER_SITE_OTHER): Payer: 59 | Admitting: Psychology

## 2019-07-06 DIAGNOSIS — F331 Major depressive disorder, recurrent, moderate: Secondary | ICD-10-CM | POA: Diagnosis not present

## 2019-07-18 ENCOUNTER — Ambulatory Visit (INDEPENDENT_AMBULATORY_CARE_PROVIDER_SITE_OTHER): Payer: 59 | Admitting: Psychology

## 2019-07-18 DIAGNOSIS — F331 Major depressive disorder, recurrent, moderate: Secondary | ICD-10-CM | POA: Diagnosis not present

## 2019-07-19 DIAGNOSIS — M7918 Myalgia, other site: Secondary | ICD-10-CM | POA: Diagnosis not present

## 2019-07-19 DIAGNOSIS — M542 Cervicalgia: Secondary | ICD-10-CM | POA: Diagnosis not present

## 2019-07-19 DIAGNOSIS — M9903 Segmental and somatic dysfunction of lumbar region: Secondary | ICD-10-CM | POA: Diagnosis not present

## 2019-07-19 DIAGNOSIS — M25551 Pain in right hip: Secondary | ICD-10-CM | POA: Diagnosis not present

## 2019-07-19 DIAGNOSIS — M9902 Segmental and somatic dysfunction of thoracic region: Secondary | ICD-10-CM | POA: Diagnosis not present

## 2019-07-19 DIAGNOSIS — M9901 Segmental and somatic dysfunction of cervical region: Secondary | ICD-10-CM | POA: Diagnosis not present

## 2019-07-19 DIAGNOSIS — M545 Low back pain: Secondary | ICD-10-CM | POA: Diagnosis not present

## 2019-07-19 DIAGNOSIS — M9905 Segmental and somatic dysfunction of pelvic region: Secondary | ICD-10-CM | POA: Diagnosis not present

## 2019-07-19 DIAGNOSIS — M7912 Myalgia of auxiliary muscles, head and neck: Secondary | ICD-10-CM | POA: Diagnosis not present

## 2019-07-25 ENCOUNTER — Other Ambulatory Visit: Payer: Self-pay | Admitting: Family

## 2019-07-25 DIAGNOSIS — F909 Attention-deficit hyperactivity disorder, unspecified type: Secondary | ICD-10-CM

## 2019-07-26 MED ORDER — DEXMETHYLPHENIDATE HCL ER 20 MG PO CP24: 20.0000 mg | ORAL_CAPSULE | Freq: Every day | ORAL | 0 refills | Status: DC

## 2019-07-26 MED FILL — DEXMETHYLPHENIDATE HCL ER 2: 20 | 30 days supply | Qty: 30 | Fill #0

## 2019-07-28 ENCOUNTER — Telehealth: Payer: 59 | Admitting: Family

## 2019-07-28 DIAGNOSIS — N390 Urinary tract infection, site not specified: Secondary | ICD-10-CM

## 2019-07-28 DIAGNOSIS — A499 Bacterial infection, unspecified: Secondary | ICD-10-CM | POA: Diagnosis not present

## 2019-07-28 MED ORDER — NITROFURANTOIN MONOHYD MACRO 100 MG PO CAPS: 100.0000 mg | ORAL_CAPSULE | Freq: Two times a day (BID) | ORAL | 0 refills | Status: DC

## 2019-07-28 MED ORDER — FLUCONAZOLE 150 MG PO TABS: 150.0000 mg | ORAL_TABLET | Freq: Once | ORAL | 0 refills | Status: AC

## 2019-07-28 MED FILL — NITROFURANTOIN MONO-MCR 100: 100 | 5 days supply | Qty: 10 | Fill #0

## 2019-07-28 MED FILL — FLUCONAZOLE 150 MG TABLET: 150 | 1 days supply | Qty: 1 | Fill #0

## 2019-07-28 NOTE — Progress Notes (Signed)
Thank you for all that you do!!!  We are sorry that you are not feeling well.  Here is how we plan to help!  Based on what you shared with me it looks like you most likely have a simple urinary tract infection.  A UTI (Urinary Tract Infection) is a bacterial infection of the bladder.  Most cases of urinary tract infections are simple to treat but a key part of your care is to encourage you to drink plenty of fluids and watch your symptoms carefully.  I have prescribed MacroBid 100 mg twice a day for 5 days. I also called in Diflucan 150mg  once for possible yeast infection.  Your symptoms should gradually improve. Call us if the burning in your urine worsens, you develop worsening fever, back pain or pelvic pain or if your symptoms do not resolve after completing the antibiotic.  Urinary tract infections can be prevented by drinking plenty of water to keep your body hydrated.  Also be sure when you wipe, wipe from front to back and don't hold it in!  If possible, empty your bladder every 4 hours.  Your e-visit answers were reviewed by a board certified advanced clinical practitioner to complete your personal care plan.  Depending on the condition, your plan could have included both over the counter or prescription medications.  If there is a problem please reply  once you have received a response from your provider.  Your safety is important to [place].  If you have drug allergies check your prescription carefully.    You can use MyChart to ask questions about today's visit, request a non-urgent call back, or ask for a work or school excuse for 24 hours related to this e-Visit. If it has been greater than 24 hours you will need to follow up with your provider, or enter a new e-Visit to address those concerns.   You will get an e-mail in the next two days asking about your experience.  I hope that your e-visit has been valuable and will speed your recovery. Thank you for using e-visits.  Greater  than 5 minutes, yet less than 10 minutes of time have been spent researching, coordinating, and implementing care for this patient today.  Thank you for the details you included in the comment boxes. Those details are very helpful in determining the best course of treatment for you and help [place] to provide the best care.

## 2019-08-01 ENCOUNTER — Ambulatory Visit: Payer: 59 | Admitting: Psychology

## 2019-08-01 ENCOUNTER — Ambulatory Visit (INDEPENDENT_AMBULATORY_CARE_PROVIDER_SITE_OTHER): Payer: 59 | Admitting: Psychology

## 2019-08-01 DIAGNOSIS — F331 Major depressive disorder, recurrent, moderate: Secondary | ICD-10-CM | POA: Diagnosis not present

## 2019-08-02 DIAGNOSIS — M9905 Segmental and somatic dysfunction of pelvic region: Secondary | ICD-10-CM | POA: Diagnosis not present

## 2019-08-02 DIAGNOSIS — M9903 Segmental and somatic dysfunction of lumbar region: Secondary | ICD-10-CM | POA: Diagnosis not present

## 2019-08-02 DIAGNOSIS — M7918 Myalgia, other site: Secondary | ICD-10-CM | POA: Diagnosis not present

## 2019-08-02 DIAGNOSIS — M9902 Segmental and somatic dysfunction of thoracic region: Secondary | ICD-10-CM | POA: Diagnosis not present

## 2019-08-02 DIAGNOSIS — M545 Low back pain: Secondary | ICD-10-CM | POA: Diagnosis not present

## 2019-08-02 DIAGNOSIS — M25551 Pain in right hip: Secondary | ICD-10-CM | POA: Diagnosis not present

## 2019-08-02 DIAGNOSIS — M542 Cervicalgia: Secondary | ICD-10-CM | POA: Diagnosis not present

## 2019-08-02 DIAGNOSIS — M7912 Myalgia of auxiliary muscles, head and neck: Secondary | ICD-10-CM | POA: Diagnosis not present

## 2019-08-02 DIAGNOSIS — M9901 Segmental and somatic dysfunction of cervical region: Secondary | ICD-10-CM | POA: Diagnosis not present

## 2019-08-22 ENCOUNTER — Ambulatory Visit (INDEPENDENT_AMBULATORY_CARE_PROVIDER_SITE_OTHER): Payer: 59 | Admitting: Psychology

## 2019-08-22 DIAGNOSIS — F331 Major depressive disorder, recurrent, moderate: Secondary | ICD-10-CM | POA: Diagnosis not present

## 2019-08-25 ENCOUNTER — Other Ambulatory Visit: Payer: Self-pay | Admitting: Family

## 2019-08-25 DIAGNOSIS — F909 Attention-deficit hyperactivity disorder, unspecified type: Secondary | ICD-10-CM

## 2019-08-29 ENCOUNTER — Other Ambulatory Visit: Payer: Self-pay | Admitting: Family

## 2019-08-29 ENCOUNTER — Encounter: Payer: Self-pay | Admitting: Family

## 2019-08-29 DIAGNOSIS — M1611 Unilateral primary osteoarthritis, right hip: Secondary | ICD-10-CM | POA: Diagnosis not present

## 2019-08-29 DIAGNOSIS — F909 Attention-deficit hyperactivity disorder, unspecified type: Secondary | ICD-10-CM

## 2019-08-29 DIAGNOSIS — M25551 Pain in right hip: Secondary | ICD-10-CM | POA: Diagnosis not present

## 2019-08-30 MED ORDER — DEXMETHYLPHENIDATE HCL ER 20 MG PO CP24: 20.0000 mg | ORAL_CAPSULE | Freq: Every day | ORAL | 0 refills | Status: DC

## 2019-08-30 MED FILL — DEXMETHYLPHENIDATE HCL ER 2: 20 | 30 days supply | Qty: 30 | Fill #0

## 2019-09-06 MED ORDER — DEXMETHYLPHENIDATE HCL ER 20 MG PO CP24: 20.0000 mg | ORAL_CAPSULE | Freq: Every day | ORAL | 0 refills | Status: DC

## 2019-09-07 ENCOUNTER — Ambulatory Visit (INDEPENDENT_AMBULATORY_CARE_PROVIDER_SITE_OTHER): Payer: 59 | Admitting: Psychology

## 2019-09-07 ENCOUNTER — Other Ambulatory Visit: Payer: Self-pay

## 2019-09-07 ENCOUNTER — Ambulatory Visit: Payer: Self-pay

## 2019-09-07 ENCOUNTER — Ambulatory Visit (INDEPENDENT_AMBULATORY_CARE_PROVIDER_SITE_OTHER): Payer: 59

## 2019-09-07 ENCOUNTER — Ambulatory Visit (INDEPENDENT_AMBULATORY_CARE_PROVIDER_SITE_OTHER): Payer: 59 | Admitting: Orthopaedic Surgery

## 2019-09-07 ENCOUNTER — Encounter: Payer: Self-pay | Admitting: Orthopaedic Surgery

## 2019-09-07 VITALS — Ht 66.0 in | Wt 176.0 lb

## 2019-09-07 DIAGNOSIS — M25551 Pain in right hip: Secondary | ICD-10-CM

## 2019-09-07 DIAGNOSIS — F331 Major depressive disorder, recurrent, moderate: Secondary | ICD-10-CM

## 2019-09-07 NOTE — Progress Notes (Signed)
Office Visit Note   Patient: [name]           Date of Birth: [birth year]           MRN: [id number] Visit Date: 09/07/2019              Requested by: Debbrah Alar, NP [address] [place],  [place] [address] PCP: Debbrah Alar, NP   Assessment & Plan: Visit Diagnoses:  1. Pain in right hip     Plan: Given the fact that her cartilage was entirely normal other than the labral tear, I am recommending an intra-articular steroid injection in the right hip.  She has not had this before and I am hoping this will treat her from a therapeutic and diagnostic standpoint.  I also do feel it is appropriate at a year after her previous MRI to repeat the MRI of her right hip to better assess the cartilage again.  If there is no cartilage wear on the femoral head or acetabulum and there are still degenerative labral changes, I would recommend even another opinion with a hip arthroscopist and I would send her to Dr. Victorino December with [facility] here in town.  I am familiar with Dr. Stann Mainland work and I think this would be an appropriate step but this will depend on her response to the steroid injection in her hip as well as what a more recent MRI shows in terms of assessing the cartilage of her right hip joint.  She agrees with this treatment plan.  All question concerns were answered and addressed.  We will have her have a consultation with Dr. Junius Roads today in our office for an ultrasound-guided right hip steroid injection and we will repeat refer her for an MRI of the right hip.  Follow-Up Instructions: Return in about 2 weeks (around 09/21/2019).   Orders:  Orders Placed This Encounter  Procedures  . XR HIP UNILAT W OR W/O PELVIS 1V RIGHT  . US Guided Needle Placement - No Linked Charges   No orders of the defined types were placed in this encounter.     Procedures: No procedures performed   Clinical Data: No additional findings.   Subjective: No chief  complaint on file. 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  She actually had an MRI arthrogram done in January 2020 that showed degenerative labral tear but no cartilage loss on the femoral head or acetabulum.  She has never had an intra-articular injection of that hip.  She was sent to a hip arthroscopy specialist over a [facility] which is Dr. Aretha Parrot.  He felt that this was degenerative in nature and that her best treatment would be a total hip arthroplasty at some point.  She still has pain with pivoting activities and it is very painful during work type activities especially when she is performing activities such as rapid response to patients.  It does hurt in the groin.  Thus the only source of her pain.  Is been slowly getting worse for her.  She is otherwise a very healthy 49 year old female.  HPI  Review of Systems She currently denies any headache, chest pain, shortness of breath, fever, chills, nausea, vomiting  Objective: Vital Signs: Ht 5\' 6"  (1.676 m)   Wt 176 lb (79.8 kg)   BMI 28.41 kg/m   Physical Exam She is alert and orient x3  and in no acute distress Ortho Exam Examination of her left hip is normal examination of her right hip shows pain with the extremes of rotation that is only in the groin at the hip.  There is no blocks to rotation but there is obvious guarding with her exam. Specialty Comments:  No specialty comments available.  Imaging: US Guided Needle Placement - No Linked Charges  Result Date: 09/07/2019 Please see Notes tab for imaging impression.  XR HIP UNILAT W OR W/O PELVIS 1V RIGHT  Result Date: 09/07/2019 An AP pelvis and lateral of the right hip shows no gross findings.  Just on the AP view the femoral head on the right side is different than the one on the left side.  There may be a component of femoral acetabular impingement but the joint space is well-maintained.  The MRI  from January 2020 did show degenerative labral tearing of the superior aspect of the acetabulum.  There is no cartilage deficit at all with the femoral head or acetabulum.  PMFS History: Patient Active Problem List   Diagnosis Date Noted  . Pulmonary nodules/lesions, multiple 10/26/2015  . Preventative health care 10/23/2015  . Irregular menses 06/14/2014  . Cervical pain (neck) 05/03/2014  . Weight gain 03/29/2012  . Depression with anxiety 03/07/2011  . Attention deficit disorder 08/31/2007  . GRAVE'S DISEASE 02/23/2007  . HYPERTHYROIDISM 02/23/2007  . Allergic rhinitis 02/23/2007  . Asthma 02/23/2007   Past Medical History:  Diagnosis Date  . ADD (attention deficit disorder)   . Adjustment disorder    without depression  . Allergy    allergic rhinitis  . Asthma   . Endometriosis   . Former smoker   . SVD (spontaneous vaginal delivery)    x 1  . Thyroid disease    hyperthyroidism / Graves Disease - no treatment needed for years per patient    Family History  Problem Relation Age of Onset  . Diabetes Other   . Stroke Other   . Hypertension Other   . Thyroid disease Other   . Migraines Other   . Breast cancer Maternal Aunt     Past Surgical History:  Procedure Laterality Date  . AUGMENTATION MAMMAPLASTY    . BREAST BIOPSY    . BREAST SURGERY  2004   augmentation  . DILITATION & CURRETTAGE/HYSTROSCOPY WITH HYDROTHERMAL ABLATION N/A 12/21/2014   Procedure: DILATATION & CURETTAGE/HYSTEROSCOPY WITH HYDROTHERMAL ABLATION;  Surgeon: Dian Queen, MD;  Location: [facility];  Service: Gynecology;  Laterality: N/A;  . LAPAROSCOPIC TUBAL LIGATION Bilateral 12/21/2014   Procedure: LAPAROSCOPIC TUBAL LIGATION;  Surgeon: Dian Queen, MD;  Location: [facility];  Service: Gynecology;  Laterality: Bilateral;  . right ankle surgery  2002  . TUBAL LIGATION  12/21/14  . WISDOM TOOTH EXTRACTION     Social History   Occupational History  . Occupation: Programmer, multimedia: [employer]  Tobacco Use  . Smoking status: Former Smoker    Packs/day: 0.50    Years: 15.00    Pack years: 7.50    Types: Cigarettes    Quit date: 07/19/2011    Years since quitting: 8.1  . Smokeless tobacco: Never Used  Substance and Sexual Activity  . Alcohol use: Yes    Comment: socially  . Drug use: No  . Sexual activity: Yes    Birth control/protection: Pill    Comment: on loestrin

## 2019-09-07 NOTE — Progress Notes (Signed)
Subjective: Patient is here for ultrasound-guided intra-articular right hip injection.   Has a degenerative labrum tear.  Very active, hoping to avoid surgery for awhile.  Objective:  Pain with passive IR.  Procedure: Ultrasound-guided right hip injection: After sterile prep with Betadine, injected 8 cc 1% lidocaine without epinephrine and 40 mg methylprednisolone using a 22-gauge spinal needle, passing the needle through the iliofemoral ligament into the femoral head/neck junction.  Injectate seen filling joint capsule.  Good immediate relief.

## 2019-09-19 ENCOUNTER — Ambulatory Visit: Payer: 59 | Admitting: Orthopaedic Surgery

## 2019-09-19 ENCOUNTER — Ambulatory Visit (INDEPENDENT_AMBULATORY_CARE_PROVIDER_SITE_OTHER): Payer: 59 | Admitting: Psychology

## 2019-09-19 DIAGNOSIS — F331 Major depressive disorder, recurrent, moderate: Secondary | ICD-10-CM

## 2019-10-03 ENCOUNTER — Encounter: Payer: Self-pay | Admitting: Radiology

## 2019-10-03 ENCOUNTER — Other Ambulatory Visit: Payer: Self-pay | Admitting: Family

## 2019-10-03 ENCOUNTER — Ambulatory Visit (INDEPENDENT_AMBULATORY_CARE_PROVIDER_SITE_OTHER): Payer: 59 | Admitting: Psychology

## 2019-10-03 DIAGNOSIS — F909 Attention-deficit hyperactivity disorder, unspecified type: Secondary | ICD-10-CM

## 2019-10-03 DIAGNOSIS — F331 Major depressive disorder, recurrent, moderate: Secondary | ICD-10-CM

## 2019-10-04 MED ORDER — DEXMETHYLPHENIDATE HCL ER 20 MG PO CP24: 20.0000 mg | ORAL_CAPSULE | Freq: Every day | ORAL | 0 refills | Status: DC

## 2019-10-04 MED FILL — DEXMETHYLPHENIDATE HCL ER 2: 20 | 30 days supply | Qty: 30 | Fill #0

## 2019-10-10 ENCOUNTER — Telehealth: Payer: 59 | Admitting: Physician Assistant

## 2019-10-10 DIAGNOSIS — N390 Urinary tract infection, site not specified: Secondary | ICD-10-CM

## 2019-10-10 MED ORDER — FLUCONAZOLE 150 MG PO TABS: 150.0000 mg | ORAL_TABLET | Freq: Once | ORAL | 0 refills | Status: AC

## 2019-10-10 MED ORDER — CEPHALEXIN 500 MG PO CAPS: 500.0000 mg | ORAL_CAPSULE | Freq: Two times a day (BID) | ORAL | 0 refills | Status: DC

## 2019-10-10 NOTE — Progress Notes (Signed)
We are sorry that you are not feeling well.  Here is how we plan to help!  Based on what you shared with me it looks like you most likely have a simple urinary tract infection.  A UTI (Urinary Tract Infection) is a bacterial infection of the bladder.  Most cases of urinary tract infections are simple to treat but a key part of your care is to encourage you to drink plenty of fluids and watch your symptoms carefully.  I have prescribed Keflex 500 mg twice a day for 7 days.  Your symptoms should gradually improve. Call us if the burning in your urine worsens, you develop worsening fever, back pain or pelvic pain or if your symptoms do not resolve after completing the antibiotic.  Urinary tract infections can be prevented by drinking plenty of water to keep your body hydrated.  Also be sure when you wipe, wipe from front to back and don't hold it in!  If possible, empty your bladder every 4 hours.  Your e-visit answers were reviewed by a board certified advanced clinical practitioner to complete your personal care plan.  Depending on the condition, your plan could have included both over the counter or prescription medications.  If there is a problem please reply  once you have received a response from your provider.  Your safety is important to [place].  If you have drug allergies check your prescription carefully.    You can use MyChart to ask questions about today's visit, request a non-urgent call back, or ask for a work or school excuse for 24 hours related to this e-Visit. If it has been greater than 24 hours you will need to follow up with your provider, or enter a new e-Visit to address those concerns.   You will get an e-mail in the next two days asking about your experience.  I hope that your e-visit has been valuable and will speed your recovery. Thank you for using e-visits.   Particia Nearing, PA-C  Approximately 5 minutes was spent documenting and reviewing patient's chart.

## 2019-10-10 NOTE — Addendum Note (Signed)
Addended by: Terald Sleeper on: 10/10/2019 11:31 AM   Modules accepted: Orders

## 2019-10-19 ENCOUNTER — Ambulatory Visit (INDEPENDENT_AMBULATORY_CARE_PROVIDER_SITE_OTHER): Payer: 59 | Admitting: Psychology

## 2019-10-19 DIAGNOSIS — F331 Major depressive disorder, recurrent, moderate: Secondary | ICD-10-CM

## 2019-11-03 ENCOUNTER — Ambulatory Visit (INDEPENDENT_AMBULATORY_CARE_PROVIDER_SITE_OTHER): Payer: 59 | Admitting: Psychology

## 2019-11-03 DIAGNOSIS — F331 Major depressive disorder, recurrent, moderate: Secondary | ICD-10-CM | POA: Diagnosis not present

## 2019-11-07 ENCOUNTER — Other Ambulatory Visit: Payer: Self-pay | Admitting: Family

## 2019-11-07 DIAGNOSIS — F909 Attention-deficit hyperactivity disorder, unspecified type: Secondary | ICD-10-CM

## 2019-11-09 ENCOUNTER — Other Ambulatory Visit: Payer: Self-pay | Admitting: Family

## 2019-11-09 DIAGNOSIS — F909 Attention-deficit hyperactivity disorder, unspecified type: Secondary | ICD-10-CM

## 2019-11-09 MED ORDER — DEXMETHYLPHENIDATE HCL ER 20 MG PO CP24: 20.0000 mg | ORAL_CAPSULE | Freq: Every day | ORAL | 0 refills | Status: DC

## 2019-11-09 NOTE — Telephone Encounter (Signed)
Last RX:10-04-2019 Last OV:06-22-2019 Next [id number] UDS:05-16-18 CSC:01-20-18 CSR:no discrepancies

## 2019-11-09 NOTE — Telephone Encounter (Signed)
Last written:  10/04/19 Last ov: 06/22/19 Next ov: none Contract: 01/20/18 UDS: 12/14/17

## 2019-11-09 NOTE — Telephone Encounter (Signed)
Refill sent. Please contact pt to schedule lab visit for UDS and contract update.

## 2019-11-10 MED FILL — DEXMETHYLPHENIDATE HCL ER 2: 20 | 30 days supply | Qty: 30 | Fill #0

## 2019-11-10 NOTE — Telephone Encounter (Signed)
Patient advised for rx and scheduled tot UDS and to sign contract on 11-15-19

## 2019-11-15 ENCOUNTER — Other Ambulatory Visit: Payer: Self-pay

## 2019-11-15 ENCOUNTER — Other Ambulatory Visit (INDEPENDENT_AMBULATORY_CARE_PROVIDER_SITE_OTHER): Payer: 59

## 2019-11-15 DIAGNOSIS — F909 Attention-deficit hyperactivity disorder, unspecified type: Secondary | ICD-10-CM

## 2019-11-16 ENCOUNTER — Ambulatory Visit (INDEPENDENT_AMBULATORY_CARE_PROVIDER_SITE_OTHER): Payer: 59 | Admitting: Psychology

## 2019-11-16 DIAGNOSIS — F331 Major depressive disorder, recurrent, moderate: Secondary | ICD-10-CM | POA: Diagnosis not present

## 2019-11-16 LAB — PAIN MGMT, PROFILE 8 W/CONF, U
6 Acetylmorphine: NEGATIVE ng/mL
Alcohol Metabolites: NEGATIVE ng/mL (ref <500)
Amphetamines: NEGATIVE ng/mL
Benzodiazepines: NEGATIVE ng/mL
Buprenorphine, Urine: NEGATIVE ng/mL
Cocaine Metabolite: NEGATIVE ng/mL
Creatinine: 80.9 mg/dL
MDMA: NEGATIVE ng/mL
Marijuana Metabolite: NEGATIVE ng/mL
Opiates: NEGATIVE ng/mL
Oxidant: NEGATIVE ug/mL
Oxycodone: NEGATIVE ng/mL
pH: 6.4 (ref 4.5–9.0)

## 2019-11-30 ENCOUNTER — Ambulatory Visit (INDEPENDENT_AMBULATORY_CARE_PROVIDER_SITE_OTHER): Payer: 59 | Admitting: Psychology

## 2019-11-30 DIAGNOSIS — F331 Major depressive disorder, recurrent, moderate: Secondary | ICD-10-CM | POA: Diagnosis not present

## 2019-12-13 ENCOUNTER — Ambulatory Visit (INDEPENDENT_AMBULATORY_CARE_PROVIDER_SITE_OTHER): Payer: 59 | Admitting: Psychology

## 2019-12-13 DIAGNOSIS — F331 Major depressive disorder, recurrent, moderate: Secondary | ICD-10-CM

## 2019-12-14 ENCOUNTER — Other Ambulatory Visit: Payer: Self-pay | Admitting: Family

## 2019-12-14 DIAGNOSIS — F909 Attention-deficit hyperactivity disorder, unspecified type: Secondary | ICD-10-CM

## 2019-12-16 MED ORDER — DEXMETHYLPHENIDATE HCL ER 20 MG PO CP24: 20.0000 mg | ORAL_CAPSULE | Freq: Every day | ORAL | 0 refills | Status: DC

## 2019-12-16 MED FILL — DEXMETHYLPHENIDATE HCL ER 2: 20 | 30 days supply | Qty: 30 | Fill #0

## 2019-12-28 ENCOUNTER — Ambulatory Visit (INDEPENDENT_AMBULATORY_CARE_PROVIDER_SITE_OTHER): Payer: 59 | Admitting: Psychology

## 2019-12-28 DIAGNOSIS — F331 Major depressive disorder, recurrent, moderate: Secondary | ICD-10-CM | POA: Diagnosis not present

## 2020-01-10 ENCOUNTER — Ambulatory Visit (INDEPENDENT_AMBULATORY_CARE_PROVIDER_SITE_OTHER): Payer: 59 | Admitting: Psychology

## 2020-01-10 DIAGNOSIS — F331 Major depressive disorder, recurrent, moderate: Secondary | ICD-10-CM

## 2020-01-18 ENCOUNTER — Other Ambulatory Visit: Payer: Self-pay | Admitting: Family

## 2020-01-18 DIAGNOSIS — F909 Attention-deficit hyperactivity disorder, unspecified type: Secondary | ICD-10-CM

## 2020-01-18 MED ORDER — DEXMETHYLPHENIDATE HCL ER 20 MG PO CP24: 20.0000 mg | ORAL_CAPSULE | Freq: Every day | ORAL | 0 refills | Status: DC

## 2020-01-18 MED FILL — DEXMETHYLPHENIDATE HCL ER 2: 20 | 30 days supply | Qty: 30 | Fill #0

## 2020-01-26 ENCOUNTER — Ambulatory Visit: Payer: 59 | Admitting: Psychology

## 2020-02-09 ENCOUNTER — Ambulatory Visit (INDEPENDENT_AMBULATORY_CARE_PROVIDER_SITE_OTHER): Payer: 59 | Admitting: Psychology

## 2020-02-09 DIAGNOSIS — F331 Major depressive disorder, recurrent, moderate: Secondary | ICD-10-CM | POA: Diagnosis not present

## 2020-02-21 ENCOUNTER — Other Ambulatory Visit: Payer: Self-pay | Admitting: Family

## 2020-02-21 DIAGNOSIS — F909 Attention-deficit hyperactivity disorder, unspecified type: Secondary | ICD-10-CM

## 2020-02-22 MED ORDER — DEXMETHYLPHENIDATE HCL ER 20 MG PO CP24: 20.0000 mg | ORAL_CAPSULE | Freq: Every day | ORAL | 0 refills | Status: DC

## 2020-02-22 MED FILL — DEXMETHYLPHENIDATE HCL ER 2: 20 | 30 days supply | Qty: 30 | Fill #0

## 2020-02-28 ENCOUNTER — Ambulatory Visit (INDEPENDENT_AMBULATORY_CARE_PROVIDER_SITE_OTHER): Payer: 59 | Admitting: Psychology

## 2020-02-28 DIAGNOSIS — F331 Major depressive disorder, recurrent, moderate: Secondary | ICD-10-CM | POA: Diagnosis not present

## 2020-03-21 ENCOUNTER — Ambulatory Visit (INDEPENDENT_AMBULATORY_CARE_PROVIDER_SITE_OTHER): Payer: 59 | Admitting: Psychology

## 2020-03-21 DIAGNOSIS — F331 Major depressive disorder, recurrent, moderate: Secondary | ICD-10-CM | POA: Diagnosis not present

## 2020-03-27 ENCOUNTER — Other Ambulatory Visit: Payer: Self-pay | Admitting: Family

## 2020-03-27 DIAGNOSIS — F909 Attention-deficit hyperactivity disorder, unspecified type: Secondary | ICD-10-CM

## 2020-03-27 NOTE — Telephone Encounter (Signed)
Requesting: Focalin xr Contract:11/21/19 UDS:11/15/19 Last Visit:06/22/19 Next Visit:04/11/20 Last Refill:02/22/20  Please Advise

## 2020-03-28 MED ORDER — DEXMETHYLPHENIDATE HCL ER 20 MG PO CP24: 20.0000 mg | ORAL_CAPSULE | Freq: Every day | ORAL | 0 refills | Status: DC

## 2020-03-28 MED FILL — DEXMETHYLPHENIDATE HCL ER 2: 20 | 30 days supply | Qty: 30 | Fill #0

## 2020-04-06 ENCOUNTER — Other Ambulatory Visit: Payer: Self-pay | Admitting: Obstetrics and Gynecology

## 2020-04-06 DIAGNOSIS — N6489 Other specified disorders of breast: Secondary | ICD-10-CM

## 2020-04-11 ENCOUNTER — Other Ambulatory Visit: Payer: Self-pay

## 2020-04-11 ENCOUNTER — Ambulatory Visit (INDEPENDENT_AMBULATORY_CARE_PROVIDER_SITE_OTHER): Payer: 59 | Admitting: Psychology

## 2020-04-11 ENCOUNTER — Ambulatory Visit: Payer: 59 | Admitting: Family

## 2020-04-11 VITALS — BP 107/75 | HR 68 | Temp 98.7°F | Resp 16 | Ht 66.0 in | Wt 183.0 lb

## 2020-04-11 DIAGNOSIS — F988 Other specified behavioral and emotional disorders with onset usually occurring in childhood and adolescence: Secondary | ICD-10-CM

## 2020-04-11 DIAGNOSIS — Z01419 Encounter for gynecological examination (general) (routine) without abnormal findings: Secondary | ICD-10-CM | POA: Diagnosis not present

## 2020-04-11 DIAGNOSIS — F331 Major depressive disorder, recurrent, moderate: Secondary | ICD-10-CM

## 2020-04-11 DIAGNOSIS — F329 Major depressive disorder, single episode, unspecified: Secondary | ICD-10-CM

## 2020-04-11 DIAGNOSIS — Z6829 Body mass index (BMI) 29.0-29.9, adult: Secondary | ICD-10-CM | POA: Diagnosis not present

## 2020-04-11 DIAGNOSIS — F32A Depression, unspecified: Secondary | ICD-10-CM

## 2020-04-11 DIAGNOSIS — N760 Acute vaginitis: Secondary | ICD-10-CM | POA: Diagnosis not present

## 2020-04-11 DIAGNOSIS — Z13820 Encounter for screening for osteoporosis: Secondary | ICD-10-CM | POA: Diagnosis not present

## 2020-04-11 MED ORDER — BUPROPION HCL ER (XL) 150 MG PO TB24
150.0000 mg | ORAL_TABLET | Freq: Every day | ORAL | 1 refills | Status: DC
Start: 2020-04-11 — End: 2020-05-29

## 2020-04-11 MED FILL — buPROPion HCL ER (XL) 150 M: 150 | 30 days supply | Qty: 30 | Fill #0

## 2020-04-11 NOTE — Patient Instructions (Signed)
Please begi wellbutrin.

## 2020-04-11 NOTE — Progress Notes (Signed)
Subjective:    Patient ID: [name], female    DOB: [birth year], 49 y.o.   MRN: [id number]  HPI  Patient is a 49 yr old female who presents today for follow up.  Depression- notes worsening depression.  Feels like her focalin has not been working as well. Not sure if it is because she has been on it so long or if it her worsening focus is due to her depression. She continues to work as an Therapist, sports in the Emergency Department and is very stressed about the the current covid cases and the crowding of the hospital because she feels like she is not able to care for her patients the way she would like.    Wt Readings from Last 3 Encounters:  04/11/20 183 lb (83 kg)  09/07/19 176 lb (79.8 kg)  06/22/19 176 lb (79.8 kg)      Review of Systems Past Medical History:  Diagnosis Date   ADD (attention deficit disorder)    Adjustment disorder    without depression   Allergy    allergic rhinitis   Asthma    Endometriosis    Former smoker    SVD (spontaneous vaginal delivery)    x 1   Thyroid disease    hyperthyroidism / Graves Disease - no treatment needed for years per patient     Social History   Socioeconomic History   Marital status: Single    Spouse name: Not on file   Number of children: 1   Years of education: Not on file   Highest education level: Not on file  Occupational History   Occupation: Programmer, multimedia: [employer]  Tobacco Use   Smoking status: Former Smoker    Packs/day: 0.50    Years: 15.00    Pack years: 7.50    Types: Cigarettes    Quit date: 07/19/2011    Years since quitting: 8.7   Smokeless tobacco: Never Used  Substance and Sexual Activity   Alcohol use: Yes    Comment: socially   Drug use: No   Sexual activity: Yes    Birth control/protection: Pill    Comment: on loestrin   Other Topics Concern   Not on file  Social History Narrative   Not on file   Social Determinants of Health   Financial Resource Strain:     Difficulty of Paying Living Expenses: Not on file  Food Insecurity:    Worried About Charity fundraiser in the Last Year: Not on file   [employer] of Food in the Last Year: Not on file  Transportation Needs:    Lack of Transportation (Medical): Not on file   Lack of Transportation (Non-Medical): Not on file  Physical Activity:    Days of Exercise per Week: Not on file   Minutes of Exercise per Session: Not on file  Stress:    Feeling of Stress : Not on file  Social Connections:    Frequency of Communication with Friends and Family: Not on file   Frequency of Social Gatherings with Friends and Family: Not on file   Attends Religious Services: Not on file   Active Member of Clubs or Organizations: Not on file   Attends Archivist Meetings: Not on file   Marital Status: Not on file  Intimate Partner Violence:    Fear of Current or Ex-Partner: Not on file   Emotionally Abused: Not on file   Physically Abused: Not  on file   Sexually Abused: Not on file    Past Surgical History:  Procedure Laterality Date   AUGMENTATION MAMMAPLASTY     BREAST BIOPSY     BREAST SURGERY  2004   augmentation   DILITATION & CURRETTAGE/HYSTROSCOPY WITH HYDROTHERMAL ABLATION N/A 12/21/2014   Procedure: DILATATION & CURETTAGE/HYSTEROSCOPY WITH HYDROTHERMAL ABLATION;  Surgeon: Dian Queen, MD;  Location: [facility];  Service: Gynecology;  Laterality: N/A;   LAPAROSCOPIC TUBAL LIGATION Bilateral 12/21/2014   Procedure: LAPAROSCOPIC TUBAL LIGATION;  Surgeon: Dian Queen, MD;  Location: [facility];  Service: Gynecology;  Laterality: Bilateral;   right ankle surgery  2002   TUBAL LIGATION  12/21/14   WISDOM TOOTH EXTRACTION      Family History  Problem Relation Age of Onset   Diabetes Other    Stroke Other    Hypertension Other    Thyroid disease Other    Migraines Other    Breast cancer Maternal Aunt     Allergies  Allergen Reactions   Escitalopram Oxalate  Other (See Comments)    Causes tremors.   Montelukast Sodium Swelling    Causes swelling and pain in the joints.   Paroxetine Hcl    Wellbutrin [Bupropion] Other (See Comments)    Did well on a low dose but higher dose caused depression.   Prednisone Anxiety and Other (See Comments)    Hot flashes, emotional upset    Current Outpatient Medications on File Prior to Visit  Medication Sig Dispense Refill   albuterol (PROVENTIL HFA;VENTOLIN HFA) 108 (90 Base) MCG/ACT inhaler Inhale 2 puffs into the lungs every 6 (six) hours as needed for wheezing or shortness of breath. 1 Inhaler 2   dexmethylphenidate (FOCALIN XR) 20 MG 24 hr capsule Take 1 capsule (20 mg total) by mouth daily. 30 capsule 0   fexofenadine (ALLEGRA) 180 MG tablet Take 180 mg by mouth daily as needed.      fluticasone (FLONASE) 50 MCG/ACT nasal spray Place 2 sprays into both nostrils daily. 16 g 6   [DISCONTINUED] Norethindrone-Ethinyl Estradiol-Fe Biphas (LO LOESTRIN FE) 1 MG-10 MCG / 10 MCG tablet Take 1 tablet by mouth daily.       No current facility-administered medications on file prior to visit.    BP 107/75 (BP Location: Right Arm, Patient Position: Sitting, Cuff Size: Small)    Pulse 68    Temp 98.7 F (37.1 C) (Oral)    Resp 16    Ht 5\' 6"  (1.676 m)    Wt 183 lb (83 kg)    SpO2 100%    BMI 29.54 kg/m       Objective:   Physical Exam Constitutional:      Appearance: She is well-developed.  Pulmonary:     Effort: Pulmonary effort is normal.  Musculoskeletal:     Cervical back: Neck supple.  Skin:    General: Skin is warm.  Neurological:     Mental Status: She is alert and oriented to person, place, and time.  Psychiatric:        Attention and Perception: Attention normal.        Mood and Affect: Mood is depressed. Affect is tearful.        Speech: Speech normal.        Behavior: Behavior normal.        Thought Content: Thought content normal.        Judgment: Judgment normal.             Assessment &  Plan:  Depression- uncontrolled. She wishes to avoid SSRI's.  She has tried wellbutrin in the past and did well at the 150mg  dose but had worsening depression on the 300mg  dose. She is interested in retrying the 150mg  dose.  Will rx with the extended release.  ADD-  I think that her worsening focus is related to her worsening depression. Continue current dose of focalin for now.  Monitor.  This visit occurred during the SARS-CoV-2 public health emergency.  Safety protocols were in place, including screening questions prior to the visit, additional usage of staff PPE, and extensive cleaning of exam room while observing appropriate contact time as indicated for disinfecting solutions.

## 2020-04-12 ENCOUNTER — Encounter: Payer: Self-pay | Admitting: Family

## 2020-04-26 ENCOUNTER — Ambulatory Visit
Admission: RE | Admit: 2020-04-26 | Discharge: 2020-04-26 | Disposition: A | Discharge status: Home / Self Care | Payer: 59 | Source: Ambulatory Visit | Attending: Obstetrics and Gynecology | Admitting: Obstetrics and Gynecology

## 2020-04-26 ENCOUNTER — Other Ambulatory Visit: Payer: Self-pay

## 2020-04-26 ENCOUNTER — Ambulatory Visit: Payer: 59

## 2020-04-26 DIAGNOSIS — M9903 Segmental and somatic dysfunction of lumbar region: Secondary | ICD-10-CM | POA: Diagnosis not present

## 2020-04-26 DIAGNOSIS — M542 Cervicalgia: Secondary | ICD-10-CM | POA: Diagnosis not present

## 2020-04-26 DIAGNOSIS — M9901 Segmental and somatic dysfunction of cervical region: Secondary | ICD-10-CM | POA: Diagnosis not present

## 2020-04-26 DIAGNOSIS — M9905 Segmental and somatic dysfunction of pelvic region: Secondary | ICD-10-CM | POA: Diagnosis not present

## 2020-04-26 DIAGNOSIS — M545 Low back pain: Secondary | ICD-10-CM | POA: Diagnosis not present

## 2020-04-26 DIAGNOSIS — M25551 Pain in right hip: Secondary | ICD-10-CM | POA: Diagnosis not present

## 2020-04-26 DIAGNOSIS — M9902 Segmental and somatic dysfunction of thoracic region: Secondary | ICD-10-CM | POA: Diagnosis not present

## 2020-04-26 DIAGNOSIS — N6489 Other specified disorders of breast: Secondary | ICD-10-CM

## 2020-04-26 DIAGNOSIS — M7918 Myalgia, other site: Secondary | ICD-10-CM | POA: Diagnosis not present

## 2020-04-26 DIAGNOSIS — R922 Inconclusive mammogram: Secondary | ICD-10-CM | POA: Diagnosis not present

## 2020-04-26 DIAGNOSIS — M7912 Myalgia of auxiliary muscles, head and neck: Secondary | ICD-10-CM | POA: Diagnosis not present

## 2020-04-29 ENCOUNTER — Other Ambulatory Visit: Payer: Self-pay | Admitting: Family

## 2020-04-29 DIAGNOSIS — F909 Attention-deficit hyperactivity disorder, unspecified type: Secondary | ICD-10-CM

## 2020-04-30 NOTE — Telephone Encounter (Signed)
Duplicate request can you delete this one please

## 2020-04-30 NOTE — Telephone Encounter (Signed)
Last written: 03/28/20 Last ov: 04/11/20 Next ov: none Contract: 11/15/19 UDS: 11/15/19

## 2020-05-01 ENCOUNTER — Ambulatory Visit (INDEPENDENT_AMBULATORY_CARE_PROVIDER_SITE_OTHER): Payer: 59 | Admitting: Psychology

## 2020-05-01 ENCOUNTER — Other Ambulatory Visit: Payer: Self-pay | Admitting: Family

## 2020-05-01 DIAGNOSIS — F331 Major depressive disorder, recurrent, moderate: Secondary | ICD-10-CM

## 2020-05-01 DIAGNOSIS — F909 Attention-deficit hyperactivity disorder, unspecified type: Secondary | ICD-10-CM

## 2020-05-01 MED ORDER — DEXMETHYLPHENIDATE HCL ER 20 MG PO CP24: 20.0000 mg | ORAL_CAPSULE | Freq: Every day | ORAL | 0 refills | Status: DC

## 2020-05-01 MED FILL — DEXMETHYLPHENIDATE HCL ER 2: 20 | 30 days supply | Qty: 30 | Fill #0

## 2020-05-02 ENCOUNTER — Telehealth: Payer: Self-pay

## 2020-05-02 NOTE — Telephone Encounter (Signed)
rx was sent in yesterday

## 2020-05-02 NOTE — Telephone Encounter (Signed)
Caller has been trying to get her Focalin 20mg  XR refilled. She has one left for tomorrow. She uses [facility].  Additional Comment Caller notified of office hours and suggested they call back when the office reopens.  Telephone: [phone]

## 2020-05-02 NOTE — Telephone Encounter (Signed)
Will you please deny auto refill since you filled the medication 9/14

## 2020-05-11 MED FILL — buPROPion HCL ER (XL) 150 M: 150 | 30 days supply | Qty: 30 | Fill #1

## 2020-05-17 ENCOUNTER — Ambulatory Visit (INDEPENDENT_AMBULATORY_CARE_PROVIDER_SITE_OTHER): Payer: 59 | Admitting: Psychology

## 2020-05-17 DIAGNOSIS — F331 Major depressive disorder, recurrent, moderate: Secondary | ICD-10-CM | POA: Diagnosis not present

## 2020-05-25 ENCOUNTER — Telehealth: Payer: 59 | Admitting: Family

## 2020-05-29 ENCOUNTER — Other Ambulatory Visit: Payer: Self-pay

## 2020-05-29 ENCOUNTER — Other Ambulatory Visit: Payer: Self-pay | Admitting: Family

## 2020-05-29 ENCOUNTER — Telehealth (INDEPENDENT_AMBULATORY_CARE_PROVIDER_SITE_OTHER): Payer: 59 | Admitting: Family

## 2020-05-29 VITALS — HR 70

## 2020-05-29 DIAGNOSIS — F32A Depression, unspecified: Secondary | ICD-10-CM

## 2020-05-29 DIAGNOSIS — F909 Attention-deficit hyperactivity disorder, unspecified type: Secondary | ICD-10-CM

## 2020-05-29 MED ORDER — DEXMETHYLPHENIDATE HCL ER 20 MG PO CP24: 20.0000 mg | ORAL_CAPSULE | Freq: Every day | ORAL | 0 refills | Status: DC

## 2020-05-29 MED ORDER — BUPROPION HCL ER (XL) 150 MG PO TB24: 150.0000 mg | ORAL_TABLET | Freq: Every day | ORAL | 1 refills | Status: DC

## 2020-05-29 NOTE — Progress Notes (Signed)
Virtual Visit via Video Note  I connected with [name] on 05/29/20 at  4:00 PM EDT by a video enabled telemedicine application and verified that I am speaking with the correct person using two identifiers.  Location: Patient: home Provider: work   I discussed the limitations of evaluation and management by telemedicine and the availability of in person appointments. The patient expressed understanding and agreed to proceed. Only the patient and myself were present for today's video call.   History of Present Illness:  Patient is a 49 year old female who presents today for follow-up of her depression.  Last visit she noted worsening focus which she attributed to her depression.  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  We gave her a trial of Wellbutrin XL 150 mg once daily.  Since starting Wellbutrin XL she reports significant improvement in her mood.  She has been getting out to see friends, hiking, and doing things she wants around the house.  She had a week off from work this past week and feels that this has been restorative for her.  She notes that she does not want to go to bars with her friends which is a new thing for her however she feels that she is just not into the bar seen right now.  She does not feel that this is depression related.  She has been staying active with regular exercise and hiking.  ADHD-she reports that her depression is improved since she started Wellbutrin XL.  She feels like she has more drive at work.  She continues extended release Focalin.     Observations/Objective:   Gen: Awake, alert, no acute distress Resp: Breathing is even and non-labored Psych: calm/pleasant demeanor Neuro: Alert and Oriented x 3, + facial symmetry, speech is clear.   Assessment and Plan:  Depression-symptoms are significantly improved following initiation of Wellbutrin XL.  We will continue current  dose at 150 mg.  ADHD-stable/improved.  Continue Focalin extended release 20 mg once daily.  Follow Up Instructions:    I discussed the assessment and treatment plan with the patient. The patient was provided an opportunity to ask questions and all were answered. The patient agreed with the plan and demonstrated an understanding of the instructions.   The patient was advised to call back or seek an in-person evaluation if the symptoms worsen or if the condition fails to improve as anticipated.  Nance Pear, NP

## 2020-05-30 MED FILL — DEXMETHYLPHENIDATE HCL ER 2: 20 | 30 days supply | Qty: 30 | Fill #0

## 2020-05-31 ENCOUNTER — Ambulatory Visit (INDEPENDENT_AMBULATORY_CARE_PROVIDER_SITE_OTHER): Payer: 59 | Admitting: Psychology

## 2020-05-31 DIAGNOSIS — F331 Major depressive disorder, recurrent, moderate: Secondary | ICD-10-CM | POA: Diagnosis not present

## 2020-06-08 MED FILL — buPROPion HCL ER (XL) 150 M: 150 | 90 days supply | Qty: 90 | Fill #0

## 2020-06-15 ENCOUNTER — Ambulatory Visit (INDEPENDENT_AMBULATORY_CARE_PROVIDER_SITE_OTHER): Payer: 59 | Admitting: Psychology

## 2020-06-15 DIAGNOSIS — F331 Major depressive disorder, recurrent, moderate: Secondary | ICD-10-CM | POA: Diagnosis not present

## 2020-06-28 ENCOUNTER — Ambulatory Visit: Payer: 59 | Admitting: Psychology

## 2020-06-28 DIAGNOSIS — M5013 Cervical disc disorder with radiculopathy, cervicothoracic region: Secondary | ICD-10-CM | POA: Diagnosis not present

## 2020-06-28 DIAGNOSIS — M542 Cervicalgia: Secondary | ICD-10-CM | POA: Diagnosis not present

## 2020-06-28 DIAGNOSIS — M545 Low back pain, unspecified: Secondary | ICD-10-CM | POA: Diagnosis not present

## 2020-06-28 DIAGNOSIS — M25572 Pain in left ankle and joints of left foot: Secondary | ICD-10-CM | POA: Diagnosis not present

## 2020-07-03 ENCOUNTER — Other Ambulatory Visit: Payer: Self-pay | Admitting: Family

## 2020-07-03 DIAGNOSIS — F909 Attention-deficit hyperactivity disorder, unspecified type: Secondary | ICD-10-CM

## 2020-07-04 ENCOUNTER — Other Ambulatory Visit: Payer: Self-pay | Admitting: Family

## 2020-07-04 MED ORDER — DEXMETHYLPHENIDATE HCL ER 20 MG PO CP24: 20.0000 mg | ORAL_CAPSULE | Freq: Every day | ORAL | 0 refills | Status: DC

## 2020-07-04 MED FILL — DEXMETHYLPHENIDATE HCL ER 2: 20 | 30 days supply | Qty: 30 | Fill #0

## 2020-07-04 NOTE — Telephone Encounter (Signed)
Last written: 05/29/20 Last ov: 05/29/20 Next ov: none Contract: 11/15/19 UDS: 11/15/19

## 2020-07-06 ENCOUNTER — Ambulatory Visit: Payer: Self-pay

## 2020-07-06 ENCOUNTER — Ambulatory Visit (INDEPENDENT_AMBULATORY_CARE_PROVIDER_SITE_OTHER): Payer: 59 | Admitting: Family Medicine

## 2020-07-06 ENCOUNTER — Other Ambulatory Visit: Payer: Self-pay

## 2020-07-06 ENCOUNTER — Encounter: Payer: Self-pay | Admitting: Family Medicine

## 2020-07-06 DIAGNOSIS — M25551 Pain in right hip: Secondary | ICD-10-CM

## 2020-07-06 NOTE — Progress Notes (Signed)
Office Visit Note   Patient: [name]           Date of Birth: Jul 16, 1971           MRN: [id number] Visit Date: 07/06/2020 Requested by: Debbrah Alar, NP [address] [place],  [place] [id number] PCP: Debbrah Alar, NP  Subjective: Chief Complaint  Patient presents with   Right Hip - Pain    Requesting another right hip cortisone injection    HPI: She is here with recurrent right hip pain.  Injection helped for many months.  In the past month her pain has started to come back.  She is scheduled to meet with Dr. Ninfa Linden at the end of this month and feels that she is ready to proceed with surgery but she would like to try another injection to see if it will get her through the rest of the year.              ROS:   All other systems were reviewed and are negative.  Objective: Vital Signs: There were no vitals taken for this visit.  Physical Exam:  General:  Alert and oriented, in no acute distress. Pulm:  Breathing unlabored. Psy:  Normal mood, congruent affect. Skin: No erythema Right hip: She walks with a limp and has pain with passive flexion and internal rotation.  Imaging: US Guided Needle Placement  Result Date: 07/06/2020 Ultrasound guided injection is preferred based studies that show increased duration, increased effect, greater accuracy, decreased procedural pain, increased response rate, and decreased cost with ultrasound guided versus blind injection.   Verbal informed consent obtained.  Time-out conducted.  Noted no overlying erythema, induration, or other signs of local infection. Ultrasound-guided right hip injection: After sterile prep with Betadine, injected 8 cc 1% lidocaine without epinephrine and 40 mg methylprednisolone using a 22-gauge spinal needle, passing the needle through the iliofemoral ligament into the femoral head/neck junction.  Injectate seen filling the joint capsule.    Assessment & Plan: 1.  Right hip  DJD -Injection given as above.  Follow-up with Dr. Ninfa Linden as scheduled.     Procedures: No procedures performed  No notes on file     PMFS History: Patient Active Problem List   Diagnosis Date Noted   Pulmonary nodules/lesions, multiple 10/26/2015   Preventative health care 10/23/2015   Irregular menses 06/14/2014   Cervical pain (neck) 05/03/2014   Weight gain 03/29/2012   Depression with anxiety 03/07/2011   Attention deficit disorder 08/31/2007   GRAVE'S DISEASE 02/23/2007   HYPERTHYROIDISM 02/23/2007   Allergic rhinitis 02/23/2007   Asthma 02/23/2007   Past Medical History:  Diagnosis Date   ADD (attention deficit disorder)    Adjustment disorder    without depression   Allergy    allergic rhinitis   Asthma    Endometriosis    Former smoker    SVD (spontaneous vaginal delivery)    x 1   Thyroid disease    hyperthyroidism / Graves Disease - no treatment needed for years per patient    Family History  Problem Relation Age of Onset   Diabetes Other    Stroke Other    Hypertension Other    Thyroid disease Other    Migraines Other    Breast cancer Maternal Aunt     Past Surgical History:  Procedure Laterality Date   AUGMENTATION MAMMAPLASTY     BREAST BIOPSY Bilateral 2016   BREAST SURGERY  2004   augmentation  DILITATION & CURRETTAGE/HYSTROSCOPY WITH HYDROTHERMAL ABLATION N/A 12/21/2014   Procedure: DILATATION & CURETTAGE/HYSTEROSCOPY WITH HYDROTHERMAL ABLATION;  Surgeon: Dian Queen, MD;  Location: [facility];  Service: Gynecology;  Laterality: N/A;   LAPAROSCOPIC TUBAL LIGATION Bilateral 12/21/2014   Procedure: LAPAROSCOPIC TUBAL LIGATION;  Surgeon: Dian Queen, MD;  Location: [facility];  Service: Gynecology;  Laterality: Bilateral;   right ankle surgery  2002   TUBAL LIGATION  12/21/14   WISDOM TOOTH EXTRACTION     Social History   Occupational History   Occupation: Programmer, multimedia: [employer]  Tobacco  Use   Smoking status: Former Smoker    Packs/day: 0.50    Years: 15.00    Pack years: 7.50    Types: Cigarettes    Quit date: 07/19/2011    Years since quitting: 8.9   Smokeless tobacco: Never Used  Substance and Sexual Activity   Alcohol use: Yes    Comment: socially   Drug use: No   Sexual activity: Yes    Birth control/protection: Pill    Comment: on loestrin

## 2020-07-15 ENCOUNTER — Ambulatory Visit
Admission: RE | Admit: 2020-07-15 | Discharge: 2020-07-15 | Disposition: A | Discharge status: Home / Self Care | Payer: 59 | Source: Ambulatory Visit | Attending: Orthopaedic Surgery | Admitting: Orthopaedic Surgery

## 2020-07-15 ENCOUNTER — Other Ambulatory Visit: Payer: Self-pay

## 2020-07-15 DIAGNOSIS — M25551 Pain in right hip: Secondary | ICD-10-CM

## 2020-07-17 ENCOUNTER — Ambulatory Visit (INDEPENDENT_AMBULATORY_CARE_PROVIDER_SITE_OTHER): Payer: 59 | Admitting: Psychology

## 2020-07-17 ENCOUNTER — Encounter: Payer: Self-pay | Admitting: Orthopaedic Surgery

## 2020-07-17 ENCOUNTER — Ambulatory Visit: Payer: 59 | Admitting: Orthopaedic Surgery

## 2020-07-17 DIAGNOSIS — M1611 Unilateral primary osteoarthritis, right hip: Secondary | ICD-10-CM | POA: Diagnosis not present

## 2020-07-17 DIAGNOSIS — F331 Major depressive disorder, recurrent, moderate: Secondary | ICD-10-CM | POA: Diagnosis not present

## 2020-07-17 DIAGNOSIS — N951 Menopausal and female climacteric states: Secondary | ICD-10-CM | POA: Diagnosis not present

## 2020-07-17 HISTORY — DX: Unilateral primary osteoarthritis, right hip: M16.11

## 2020-07-17 NOTE — Progress Notes (Signed)
Office Visit Note   Patient: [name]           Date of Birth: 06-07-1971           MRN: [id number] Visit Date: 07/17/2020              Requested by: Debbrah Alar, NP [place] [place],  [place] [address] PCP: Debbrah Alar, NP   Assessment & Plan: Visit Diagnoses:  1. Unilateral primary osteoarthritis, right hip     Plan: At this point I have recommended hip replacement surgery for right hip given the failure of all forms conservative treatment.  I showed her hip model and gave her handout about surgery.  I discussed in detail what the surgery involves.  I went over her x-rays and MRI findings.  I discussed the risk and benefits of surgery as well as what to expect intraoperative and postoperative.  All questions and concerns were answered and addressed.  We will work on having the surgery scheduled sometime in the next month.  Follow-Up Instructions: Return for 2 weeks post-op.   Orders:  No orders of the defined types were placed in this encounter.  No orders of the defined types were placed in this encounter.     Procedures: No procedures performed   Clinical Data: No additional findings.   Subjective: Chief Complaint  Patient presents with  . Right Hip - Pain  The patient comes in today for further ration treatment of right hip pain and known osteoarthritis with degenerative labral tearing of the right hip.  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  She has had at least 2 intra-articular steroid injections in the right hip which have helped but then they wear off.  She has had dry needling and formal physical therapy as well.  At this point her right hip pain is daily and is activity related.  She has a lot of groin pain with pivoting.  It is waking her up at night.  At this point it is definitely affecting her mobility, her quality of life and activities day living to the point she does  proceed with a hip replacement.  She has been dealing with this for over 2 years now.  She is not a diabetic.  Again she is very active.  She has tried and failed all forms conservative treatment including activity modification, anti-inflammatories, formal physical therapy, intra-articular steroid injections and rest.   HPI  Review of Systems She currently denies any headache, chest pain, shortness of breath, fever, chills, nausea, vomiting  Objective: Vital Signs: There were no vitals taken for this visit.  Physical Exam She is alert and orient x3 and in no acute distress Ortho Exam On examination of her right hip there is severe pain in the groin with internal and external rotation.  Her left hip moves smoothly. Specialty Comments:  No specialty comments available.  Imaging: No results found. I was able to independently review the MRI of her right hip as well as plain films.  There is superior lateral spurring of the femoral head and acetabulum.  There is some edema in the weightbearing surface of the femoral head and degenerative tearing of the labrum.  This is mainly at the weightbearing aspect of the hip.  PMFS History: Patient Active Problem List   Diagnosis Date Noted  . Unilateral primary osteoarthritis, right hip 07/17/2020  . Pulmonary nodules/lesions, multiple 10/26/2015  .  Preventative health care 10/23/2015  . Irregular menses 06/14/2014  . Cervical pain (neck) 05/03/2014  . Weight gain 03/29/2012  . Depression with anxiety 03/07/2011  . Attention deficit disorder 08/31/2007  . GRAVE'S DISEASE 02/23/2007  . HYPERTHYROIDISM 02/23/2007  . Allergic rhinitis 02/23/2007  . Asthma 02/23/2007   Past Medical History:  Diagnosis Date  . ADD (attention deficit disorder)   . Adjustment disorder    without depression  . Allergy    allergic rhinitis  . Asthma   . Endometriosis   . Former smoker   . SVD (spontaneous vaginal delivery)    x 1  . Thyroid disease     hyperthyroidism / Graves Disease - no treatment needed for years per patient    Family History  Problem Relation Age of Onset  . Diabetes Other   . Stroke Other   . Hypertension Other   . Thyroid disease Other   . Migraines Other   . Breast cancer Maternal Aunt     Past Surgical History:  Procedure Laterality Date  . AUGMENTATION MAMMAPLASTY    . BREAST BIOPSY Bilateral 2016  . BREAST SURGERY  2004   augmentation  . DILITATION & CURRETTAGE/HYSTROSCOPY WITH HYDROTHERMAL ABLATION N/A 12/21/2014   Procedure: DILATATION & CURETTAGE/HYSTEROSCOPY WITH HYDROTHERMAL ABLATION;  Surgeon: Dian Queen, MD;  Location: [facility];  Service: Gynecology;  Laterality: N/A;  . LAPAROSCOPIC TUBAL LIGATION Bilateral 12/21/2014   Procedure: LAPAROSCOPIC TUBAL LIGATION;  Surgeon: Dian Queen, MD;  Location: [facility];  Service: Gynecology;  Laterality: Bilateral;  . right ankle surgery  2002  . TUBAL LIGATION  12/21/14  . WISDOM TOOTH EXTRACTION     Social History   Occupational History  . Occupation: Programmer, multimedia: [employer]  Tobacco Use  . Smoking status: Former Smoker    Packs/day: 0.50    Years: 15.00    Pack years: 7.50    Types: Cigarettes    Quit date: 07/19/2011    Years since quitting: 9.0  . Smokeless tobacco: Never Used  Substance and Sexual Activity  . Alcohol use: Yes    Comment: socially  . Drug use: No  . Sexual activity: Yes    Birth control/protection: Pill    Comment: on loestrin

## 2020-07-26 DIAGNOSIS — R6882 Decreased libido: Secondary | ICD-10-CM | POA: Diagnosis not present

## 2020-07-26 DIAGNOSIS — R232 Flushing: Secondary | ICD-10-CM | POA: Diagnosis not present

## 2020-07-26 DIAGNOSIS — N951 Menopausal and female climacteric states: Secondary | ICD-10-CM | POA: Diagnosis not present

## 2020-07-26 DIAGNOSIS — Z6829 Body mass index (BMI) 29.0-29.9, adult: Secondary | ICD-10-CM | POA: Diagnosis not present

## 2020-07-26 DIAGNOSIS — N898 Other specified noninflammatory disorders of vagina: Secondary | ICD-10-CM | POA: Diagnosis not present

## 2020-07-26 DIAGNOSIS — G479 Sleep disorder, unspecified: Secondary | ICD-10-CM | POA: Diagnosis not present

## 2020-08-02 ENCOUNTER — Other Ambulatory Visit: Payer: Self-pay | Admitting: Family

## 2020-08-02 DIAGNOSIS — F909 Attention-deficit hyperactivity disorder, unspecified type: Secondary | ICD-10-CM

## 2020-08-03 ENCOUNTER — Other Ambulatory Visit: Payer: Self-pay | Admitting: Family

## 2020-08-03 MED ORDER — DEXMETHYLPHENIDATE HCL ER 20 MG PO CP24: 20.0000 mg | ORAL_CAPSULE | Freq: Every day | ORAL | 0 refills | Status: DC

## 2020-08-03 MED FILL — DEXMETHYLPHENIDATE HCL ER 2: 20 | 30 days supply | Qty: 30 | Fill #0

## 2020-08-03 NOTE — Telephone Encounter (Signed)
Requesting: Focalin XR 20mg  Contract: 11/10/2019 UDS: 11/15/2019 Last Visit: 05/29/2020 Next Visit: None Last Refill: 07/04/2020 #30 and 0RF  Please Advise

## 2020-08-15 DIAGNOSIS — R232 Flushing: Secondary | ICD-10-CM | POA: Diagnosis not present

## 2020-08-15 DIAGNOSIS — N898 Other specified noninflammatory disorders of vagina: Secondary | ICD-10-CM | POA: Diagnosis not present

## 2020-08-15 DIAGNOSIS — N951 Menopausal and female climacteric states: Secondary | ICD-10-CM | POA: Diagnosis not present

## 2020-08-23 ENCOUNTER — Ambulatory Visit (INDEPENDENT_AMBULATORY_CARE_PROVIDER_SITE_OTHER): Payer: 59 | Admitting: Psychology

## 2020-08-23 DIAGNOSIS — F331 Major depressive disorder, recurrent, moderate: Secondary | ICD-10-CM

## 2020-09-05 ENCOUNTER — Other Ambulatory Visit: Payer: Self-pay | Admitting: Family

## 2020-09-05 DIAGNOSIS — F909 Attention-deficit hyperactivity disorder, unspecified type: Secondary | ICD-10-CM

## 2020-09-05 MED ORDER — DEXMETHYLPHENIDATE HCL ER 20 MG PO CP24: 20.0000 mg | ORAL_CAPSULE | Freq: Every day | ORAL | 0 refills | Status: DC

## 2020-09-05 MED FILL — buPROPion HCL ER (XL) 150 M: 150 | 90 days supply | Qty: 90 | Fill #1

## 2020-09-05 MED FILL — DEXMETHYLPHENIDATE HCL ER 2: 20 | 30 days supply | Qty: 30 | Fill #0

## 2020-09-05 NOTE — Telephone Encounter (Signed)
Requesting: Focalin XR 20mg  Contract: 11/10/2019 UDS: 11/15/2019 Last Visit: 05/29/2020 Next Visit: None Last Refill: 08/03/2020 #30 and 0RF  Please Advise

## 2020-09-06 NOTE — Progress Notes (Addendum)
PCP - Melissa osullivan,NP Cardiologist - NO  PPM/ICD -  Device Orders -  Rep Notified -   Chest x-ray -  EKG -  Stress Test -  ECHO -  Cardiac Cath -   Sleep Study -  CPAP -   Fasting Blood Sugar -  Checks Blood Sugar _____ times a day  Blood Thinner Instructions: Aspirin Instructions:  ERAS Protcol - PRE-SURGERY   ACTIVITY-- BIKES no Sob with 4 miles , ran marathons before hip pain  COVID TEST- 09-18-20   Anesthesia review:   Patient denies shortness of breath, fever, cough and chest pain at PAT appointment NONE   All instructions explained to the patient, with a verbal understanding of the material. Patient agrees to go over the instructions while at home for a better understanding. Patient also instructed to self quarantine after being tested for COVID-19. The opportunity to ask questions was provided.

## 2020-09-06 NOTE — Progress Notes (Signed)
Please place orders in epic pt. Scheduled for a preop.

## 2020-09-06 NOTE — Patient Instructions (Addendum)
DUE TO COVID-19 ONLY ONE VISITOR IS ALLOWED TO COME WITH YOU AND STAY IN THE WAITING ROOM ONLY DURING PRE OP AND PROCEDURE DAY OF SURGERY. THE 1 VISITOR  MAY VISIT WITH YOU AFTER SURGERY IN YOUR PRIVATE ROOM DURING VISITING HOURS ONLY!  YOU NEED TO HAVE A COVID 19 TEST ON___2-1-22____ @_______ , THIS TEST MUST BE DONE BEFORE SURGERY,  COVID TESTING SITE [address] [place] [id number], IT IS ON THE RIGHT GOING OUT [address] APPROXIMATELY  2 MINUTES PAST [employer] ON THE RIGHT. ONCE YOUR COVID TEST IS COMPLETED,  PLEASE BEGIN THE QUARANTINE INSTRUCTIONS AS OUTLINED IN YOUR HANDOUT.                [name]  09/06/2020   Your procedure is scheduled on: 09-21-20   Report to [facility] Main  Entrance   Report to admitting at      0600 AM     Call this number if you have problems the morning of surgery [phone]    Remember: NO SOLID FOOD AFTER MIDNIGHT THE NIGHT PRIOR TO SURGERY. NOTHING BY MOUTH EXCEPT CLEAR LIQUIDS UNTIL  0530 am  CLEAR LIQUID DIET   Foods Allowed                                                                                 Foods Excluded  Black Coffee and tea, regular and decaf                                        liquids that you cannot  Plain Jell-O any favor except red or purple                                           see through such as: Fruit ices (not with fruit pulp)                                                             milk, soups, orange juice  Iced Popsicles                                                          All solid food Carbonated beverages, regular and diet                                    Cranberry, grape and apple juices Sports drinks like Gatorade Lightly seasoned clear broth or consume(fat free) Sugar, honey syrup   _____________________________________________________________________     BRUSH YOUR TEETH  MORNING OF SURGERY AND RINSE YOUR MOUTH OUT, NO CHEWING GUM CANDY OR  MINTS.     Take these medicines the morning of surgery with A SIP OF WATER: wellbutrin                                 You may not have any metal on your body including hair pins and              piercings  Do not wear jewelry, make-up, lotions, powders or perfumes, deodorant             Do not wear nail polish on your fingernails.  Do not shave  48 hours prior to surgery.           Do not bring valuables to the hospital. [place].  Contacts, dentures or bridgework may not be worn into surgery.      Patients discharged the day of surgery will not be allowed to drive home. IF YOU ARE HAVING SURGERY AND GOING HOME THE SAME DAY, YOU MUST HAVE AN ADULT TO DRIVE YOU HOME AND BE WITH YOU FOR 24 HOURS. YOU MAY GO HOME BY TAXI OR UBER OR ORTHERWISE, BUT AN ADULT MUST ACCOMPANY YOU HOME AND STAY WITH YOU FOR 24 HOURS.  Name and phone number of your driver:  Special Instructions: N/A              Please read over the following fact sheets you were given: _____________________________________________________________________             [facility] - Preparing for Surgery Before surgery, you can play an important role.  Because skin is not sterile, your skin needs to be as free of germs as possible.  You can reduce the number of germs on your skin by washing with CHG (chlorahexidine gluconate) soap before surgery.  CHG is an antiseptic cleaner which kills germs and bonds with the skin to continue killing germs even after washing. Please DO NOT use if you have an allergy to CHG or antibacterial soaps.  If your skin becomes reddened/irritated stop using the CHG and inform your nurse when you arrive at Short Stay. Do not shave (including legs and underarms) for at least 48 hours prior to the first CHG shower.  You may shave your face/neck. Please follow these instructions carefully:  1.  Shower with CHG Soap the night before surgery and the  morning  of Surgery.  2.  If you choose to wash your hair, wash your hair first as usual with your  normal  shampoo.  3.  After you shampoo, rinse your hair and body thoroughly to remove the  shampoo.                           4.  Use CHG as you would any other liquid soap.  You can apply chg directly  to the skin and wash                       Gently with a scrungie or clean washcloth.  5.  Apply the CHG Soap to your body ONLY FROM THE NECK DOWN.   Do not use on face/ open  Wound or open sores. Avoid contact with eyes, ears mouth and genitals (private parts).                       Wash face,  Genitals (private parts) with your normal soap.             6.  Wash thoroughly, paying special attention to the area where your surgery  will be performed.  7.  Thoroughly rinse your body with warm water from the neck down.  8.  DO NOT shower/wash with your normal soap after using and rinsing off  the CHG Soap.                9.  Pat yourself dry with a clean towel.            10.  Wear clean pajamas.            11.  Place clean sheets on your bed the night of your first shower and do not  sleep with pets. Day of Surgery : Do not apply any lotions/deodorants the morning of surgery.  Please wear clean clothes to the hospital/surgery center.  FAILURE TO FOLLOW THESE INSTRUCTIONS MAY RESULT IN THE CANCELLATION OF YOUR SURGERY PATIENT SIGNATURE_________________________________  NURSE SIGNATURE__________________________________  ________________________________________________________________________   [name]  An incentive spirometer is a tool that can help keep your lungs clear and active. This tool measures how well you are filling your lungs with each breath. Taking long deep breaths may help reverse or decrease the chance of developing breathing (pulmonary) problems (especially infection) following:  A long period of time when you are unable to move or be  active. BEFORE THE PROCEDURE   If the spirometer includes an indicator to show your best effort, your nurse or respiratory therapist will set it to a desired goal.  If possible, sit up straight or lean slightly forward. Try not to slouch.  Hold the incentive spirometer in an upright position. INSTRUCTIONS FOR USE  1. Sit on the edge of your bed if possible, or sit up as far as you can in bed or on a chair. 2. Hold the incentive spirometer in an upright position. 3. Breathe out normally. 4. Place the mouthpiece in your mouth and seal your lips tightly around it. 5. Breathe in slowly and as deeply as possible, raising the piston or the ball toward the top of the column. 6. Hold your breath for 3-5 seconds or for as long as possible. Allow the piston or ball to fall to the bottom of the column. 7. Remove the mouthpiece from your mouth and breathe out normally. 8. Rest for a few seconds and repeat Steps 1 through 7 at least 10 times every 1-2 hours when you are awake. Take your time and take a few normal breaths between deep breaths. 9. The spirometer may include an indicator to show your best effort. Use the indicator as a goal to work toward during each repetition. 10. After each set of 10 deep breaths, practice coughing to be sure your lungs are clear. If you have an incision (the cut made at the time of surgery), support your incision when coughing by placing a pillow or rolled up towels firmly against it. Once you are able to get out of bed, walk around indoors and cough well. You may stop using the incentive spirometer when instructed by your caregiver.  RISKS AND COMPLICATIONS  Take your time so you do not get  dizzy or light-headed.  If you are in pain, you may need to take or ask for pain medication before doing incentive spirometry. It is harder to take a deep breath if you are having pain. AFTER USE  Rest and breathe slowly and easily.  It can be helpful to keep track of a log of  your progress. Your caregiver can provide you with a simple table to help with this. If you are using the spirometer at home, follow these instructions: [place] IF:   You are having difficultly using the spirometer.  You have trouble using the spirometer as often as instructed.  Your pain medication is not giving enough relief while using the spirometer.  You develop fever of 100.5 F (38.1 C) or higher. SEEK IMMEDIATE MEDICAL CARE IF:   You cough up bloody sputum that had not been present before.  You develop fever of 102 F (38.9 C) or greater.  You develop worsening pain at or near the incision site. MAKE SURE YOU:   Understand these instructions.  Will watch your condition.  Will get help right away if you are not doing well or get worse. Document Released: 12/15/2006 Document Revised: 10/27/2011 Document Reviewed: 02/15/2007 [facility] Patient Information 2014 [place], [place].   ________________________________________________________________________

## 2020-09-10 ENCOUNTER — Other Ambulatory Visit: Payer: Self-pay

## 2020-09-10 ENCOUNTER — Encounter (HOSPITAL_COMMUNITY)
Admission: RE | Admit: 2020-09-10 | Discharge: 2020-09-10 | Disposition: A | Discharge status: Home / Self Care | Payer: 59 | Source: Ambulatory Visit | Attending: Orthopaedic Surgery | Admitting: Orthopaedic Surgery

## 2020-09-10 ENCOUNTER — Encounter (HOSPITAL_COMMUNITY): Payer: Self-pay

## 2020-09-10 ENCOUNTER — Other Ambulatory Visit: Payer: Self-pay | Admitting: Physician Assistant

## 2020-09-10 DIAGNOSIS — Z01812 Encounter for preprocedural laboratory examination: Secondary | ICD-10-CM | POA: Diagnosis not present

## 2020-09-10 HISTORY — DX: Depression, unspecified: F32.A

## 2020-09-10 LAB — TYPE AND SCREEN
ABO/RH(D): A POS
Antibody Screen: NEGATIVE

## 2020-09-10 LAB — CBC
HCT: 39.7 % (ref 36.0–46.0)
Hemoglobin: 13.7 g/dL (ref 12.0–15.0)
MCH: 33.1 pg (ref 26.0–34.0)
MCHC: 34.5 g/dL (ref 30.0–36.0)
MCV: 95.9 fL (ref 80.0–100.0)
Platelets: 206 10*3/uL (ref 150–400)
RBC: 4.14 MIL/uL (ref 3.87–5.11)
RDW: 12.2 % (ref 11.5–15.5)
WBC: 4.5 10*3/uL (ref 4.0–10.5)
nRBC: 0 % (ref 0.0–0.2)

## 2020-09-10 LAB — SURGICAL PCR SCREEN
MRSA, PCR: NEGATIVE
Staphylococcus aureus: POSITIVE — AB

## 2020-09-13 ENCOUNTER — Telehealth: Payer: Self-pay | Admitting: Orthopaedic Surgery

## 2020-09-13 ENCOUNTER — Other Ambulatory Visit: Payer: Self-pay

## 2020-09-13 NOTE — Telephone Encounter (Signed)
[employer] forms received. Sent to [employer]. 

## 2020-09-13 NOTE — Telephone Encounter (Signed)
Received medical records request for from patient and $25.00 check   Waiting for patient to call with  [employer]  fax  Number will forward to [facility] 09/14/2020

## 2020-09-14 ENCOUNTER — Ambulatory Visit (INDEPENDENT_AMBULATORY_CARE_PROVIDER_SITE_OTHER): Payer: 59 | Admitting: Psychology

## 2020-09-14 DIAGNOSIS — F331 Major depressive disorder, recurrent, moderate: Secondary | ICD-10-CM

## 2020-09-17 DIAGNOSIS — N951 Menopausal and female climacteric states: Secondary | ICD-10-CM | POA: Diagnosis not present

## 2020-09-18 ENCOUNTER — Other Ambulatory Visit (HOSPITAL_COMMUNITY)
Admission: RE | Admit: 2020-09-18 | Discharge: 2020-09-18 | Disposition: A | Discharge status: Home / Self Care | Payer: 59 | Source: Ambulatory Visit | Attending: Orthopaedic Surgery | Admitting: Orthopaedic Surgery

## 2020-09-18 DIAGNOSIS — Z20822 Contact with and (suspected) exposure to covid-19: Secondary | ICD-10-CM | POA: Insufficient documentation

## 2020-09-18 DIAGNOSIS — Z01812 Encounter for preprocedural laboratory examination: Secondary | ICD-10-CM | POA: Diagnosis not present

## 2020-09-18 LAB — SARS CORONAVIRUS 2 (TAT 6-24 HRS): SARS Coronavirus 2: NEGATIVE

## 2020-09-19 DIAGNOSIS — R232 Flushing: Secondary | ICD-10-CM | POA: Diagnosis not present

## 2020-09-19 DIAGNOSIS — N898 Other specified noninflammatory disorders of vagina: Secondary | ICD-10-CM | POA: Diagnosis not present

## 2020-09-19 DIAGNOSIS — N951 Menopausal and female climacteric states: Secondary | ICD-10-CM | POA: Diagnosis not present

## 2020-09-20 ENCOUNTER — Other Ambulatory Visit: Payer: Self-pay | Admitting: Orthopaedic Surgery

## 2020-09-20 MED ORDER — METHOCARBAMOL 500 MG PO TABS: 500.0000 mg | ORAL_TABLET | Freq: Four times a day (QID) | ORAL | 1 refills | Status: DC | PRN

## 2020-09-20 MED ORDER — ASPIRIN 81 MG PO CHEW
81.0000 mg | CHEWABLE_TABLET | Freq: Two times a day (BID) | ORAL | 0 refills | Status: DC
Start: 2020-09-20 — End: 2020-11-05

## 2020-09-20 MED ORDER — OXYCODONE HCL 5 MG PO TABS: 5.0000 mg | ORAL_TABLET | ORAL | 0 refills | Status: DC | PRN

## 2020-09-20 MED ORDER — ONDANSETRON 4 MG PO TBDP: 4.0000 mg | ORAL_TABLET | Freq: Three times a day (TID) | ORAL | 0 refills | Status: DC | PRN

## 2020-09-20 MED FILL — ONDANSETRON ODT 4 MG TABLET: 4 | 7 days supply | Qty: 20 | Fill #0

## 2020-09-20 MED FILL — METHOCARBAMOL 500 MG TABS: 500 | 10 days supply | Qty: 40 | Fill #0

## 2020-09-20 MED FILL — oxyCODONE HCL 5 MG TABS: 5 | 3 days supply | Qty: 30 | Fill #0

## 2020-09-20 NOTE — H&P (Signed)
TOTAL HIP ADMISSION H&P  Patient is admitted for right total hip arthroplasty.  Subjective:  Chief Complaint: right hip pain  HPI: [name], 50 y.o. female, has a history of pain and functional disability in the right hip(s) due to arthritis and patient has failed non-surgical conservative treatments for greater than 12 weeks to include NSAID's and/or analgesics, corticosteriod injections, flexibility and strengthening excercises and activity modification.  Onset of symptoms was gradual starting 3 years ago with gradually worsening course since that time.The patient noted no past surgery on the right hip(s).  Patient currently rates pain in the right hip at 10 out of 10 with activity. Patient has worsening of pain with activity and weight bearing, pain that interfers with activities of daily living and pain with passive range of motion. Patient has evidence of joint space narrowing and labral tearing by imaging studies. This condition presents safety issues increasing the risk of falls.  There is no current active infection.  Patient Active Problem List   Diagnosis Date Noted  . Unilateral primary osteoarthritis, right hip 07/17/2020  . Pulmonary nodules/lesions, multiple 10/26/2015  . Preventative health care 10/23/2015  . Irregular menses 06/14/2014  . Cervical pain (neck) 05/03/2014  . Weight gain 03/29/2012  . Depression with anxiety 03/07/2011  . Attention deficit disorder 08/31/2007  . GRAVE'S DISEASE 02/23/2007  . HYPERTHYROIDISM 02/23/2007  . Allergic rhinitis 02/23/2007  . Asthma 02/23/2007   Past Medical History:  Diagnosis Date  . ADD (attention deficit disorder)   . Adjustment disorder    without depression  . Allergy    allergic rhinitis  . Asthma    hx of exercised induced  . Depression   . Endometriosis   . Former smoker   . SVD (spontaneous vaginal delivery)    x 1  . Thyroid disease    hyperthyroidism / Graves Disease - no treatment needed for years per  patient    Past Surgical History:  Procedure Laterality Date  . AUGMENTATION MAMMAPLASTY    . BREAST BIOPSY Bilateral 2016  . BREAST SURGERY  2004   augmentation  . DILITATION & CURRETTAGE/HYSTROSCOPY WITH HYDROTHERMAL ABLATION N/A 12/21/2014   Procedure: DILATATION & CURETTAGE/HYSTEROSCOPY WITH HYDROTHERMAL ABLATION;  Surgeon: Dian Queen, MD;  Location: [facility];  Service: Gynecology;  Laterality: N/A;  . LAPAROSCOPIC TUBAL LIGATION Bilateral 12/21/2014   Procedure: LAPAROSCOPIC TUBAL LIGATION;  Surgeon: Dian Queen, MD;  Location: [facility];  Service: Gynecology;  Laterality: Bilateral;  . right ankle surgery  2002  . TUBAL LIGATION  12/21/14  . WISDOM TOOTH EXTRACTION      No current facility-administered medications for this encounter.   Current Outpatient Medications  Medication Sig Dispense Refill Last Dose  . aspirin (ASPIRIN CHILDRENS) 81 MG chewable tablet Chew 1 tablet (81 mg total) by mouth 2 (two) times daily after a meal. 30 tablet 0   . buPROPion (WELLBUTRIN XL) 150 MG 24 hr tablet Take 1 tablet (150 mg total) by mouth daily. 90 tablet 1   . DHEA 50 MG CAPS Take 50 mg by mouth daily.     . fexofenadine (ALLEGRA) 180 MG tablet Take 180 mg by mouth daily as needed for allergies.     . fluticasone (FLONASE) 50 MCG/ACT nasal spray Place 2 sprays into both nostrils daily. (Patient taking differently: Place 2 sprays into both nostrils daily as needed for allergies.) 16 g 6   . Multiple Vitamins-Minerals (MULTIVITAMIN WITH MINERALS) tablet Take 1 tablet by mouth daily.     Marland Kitchen  Nutritional Supplements (JUICE PLUS FIBRE PO) Take 2 capsules by mouth daily.     . Probiotic Product (PROBIOTIC DAILY PO) Take 1 capsule by mouth daily.     Marland Kitchen dexmethylphenidate (FOCALIN XR) 20 MG 24 hr capsule Take 1 capsule (20 mg total) by mouth daily. 30 capsule 0   . methocarbamol (ROBAXIN) 500 MG tablet Take 1 tablet (500 mg total) by mouth every 6 (six) hours as needed. 40 tablet 1   . ondansetron  (ZOFRAN ODT) 4 MG disintegrating tablet Take 1 tablet (4 mg total) by mouth every 8 (eight) hours as needed for nausea or vomiting. 20 tablet 0   . oxyCODONE (ROXICODONE) 5 MG immediate release tablet Take 1-2 tablets (5-10 mg total) by mouth every 4 (four) hours as needed for severe pain. 30 tablet 0    Allergies  Allergen Reactions  . Escitalopram Oxalate Other (See Comments)    Causes tremors.  . Montelukast Sodium Swelling    Causes swelling and pain in the joints.  . Paroxetine Hcl     Too high of a dose makes depressed   . Wellbutrin [Bupropion] Other (See Comments)    Did well on a low dose but higher dose caused depression.  . Prednisone Anxiety and Other (See Comments)    Hot flashes, emotional upset    Social History   Tobacco Use  . Smoking status: Former Smoker    Packs/day: 0.50    Years: 15.00    Pack years: 7.50    Types: Cigarettes    Quit date: 07/19/2011    Years since quitting: 9.1  . Smokeless tobacco: Never Used  Substance Use Topics  . Alcohol use: Not Currently    Comment: socially    Family History  Problem Relation Age of Onset  . Diabetes Other   . Stroke Other   . Hypertension Other   . Thyroid disease Other   . Migraines Other   . Breast cancer Maternal Aunt      Review of Systems  All other systems reviewed and are negative.   Objective:  Physical Exam Vitals reviewed.  Constitutional:      Appearance: Normal appearance.  HENT:     Head: Normocephalic and atraumatic.  Eyes:     Extraocular Movements: Extraocular movements intact.     Pupils: Pupils are equal, round, and reactive to light.  Cardiovascular:     Rate and Rhythm: Normal rate and regular rhythm.     Pulses: Normal pulses.  Pulmonary:     Effort: Pulmonary effort is normal.     Breath sounds: Normal breath sounds.  Abdominal:     General: Abdomen is flat.     Palpations: Abdomen is soft.  Musculoskeletal:     Cervical back: Normal range of motion and neck supple.      Right hip: Tenderness and bony tenderness present. Decreased range of motion. Decreased strength.  Neurological:     Mental Status: She is alert and oriented to person, place, and time.  Psychiatric:        Behavior: Behavior normal.     Vital signs in last 24 hours:    Labs:   Estimated body mass index is 28.25 kg/m as calculated from the following:   Height as of 09/10/20: 5\' 6"  (1.676 m).   Weight as of 09/10/20: 79.4 kg.   Imaging Review Plain radiographs demonstrate mild degenerative joint disease of the right hip(s). The bone quality appears to be excellent for age and  reported activity level. MRI shows more significant hip OA with a large degenerative labral tear.     Assessment/Plan:  End stage arthritis, right hip(s)  The patient history, physical examination, clinical judgement of the provider and imaging studies are consistent with end stage degenerative joint disease of the right hip(s) and total hip arthroplasty is deemed medically necessary. The treatment options including medical management, injection therapy, arthroscopy and arthroplasty were discussed at length. The risks and benefits of total hip arthroplasty were presented and reviewed. The risks due to aseptic loosening, infection, stiffness, dislocation/subluxation,  thromboembolic complications and other imponderables were discussed.  The patient acknowledged the explanation, agreed to proceed with the plan and consent was signed. Patient is being admitted for inpatient treatment for surgery, pain control, PT, OT, prophylactic antibiotics, VTE prophylaxis, progressive ambulation and ADL's and discharge planning.The patient is planning to be discharged home with home health services    Patient's anticipated LOS is less than 2 midnights, meeting these requirements: - Younger than 68 - Lives within 1 hour of care - Has a competent adult at home to recover with post-op recover - NO history of  - Chronic  pain requiring opiods  - Diabetes  - Coronary Artery Disease  - Heart failure  - Heart attack  - Stroke  - DVT/VTE  - Cardiac arrhythmia  - Respiratory Failure/COPD  - Renal failure  - Anemia  - Advanced Liver disease

## 2020-09-20 NOTE — Progress Notes (Signed)
Called pt about time change for 09/21/20 surgery. Pt to arrive 0700 for 0930 surgery. ERAS drink to be completed by 0630. Patient verbalizes understanding.

## 2020-09-21 ENCOUNTER — Encounter (HOSPITAL_COMMUNITY): Payer: Self-pay | Admitting: Orthopaedic Surgery

## 2020-09-21 ENCOUNTER — Ambulatory Visit (HOSPITAL_COMMUNITY): Payer: 59 | Admitting: Certified Registered Nurse Anesthetist

## 2020-09-21 ENCOUNTER — Ambulatory Visit (HOSPITAL_COMMUNITY): Payer: 59

## 2020-09-21 ENCOUNTER — Ambulatory Visit (HOSPITAL_COMMUNITY)
Admission: RE | Admit: 2020-09-21 | Discharge: 2020-09-21 | Disposition: A | Discharge status: Home / Self Care | Payer: 59 | Attending: Orthopaedic Surgery | Admitting: Orthopaedic Surgery

## 2020-09-21 ENCOUNTER — Encounter (HOSPITAL_COMMUNITY)
Admission: RE | Disposition: A | Discharge status: Home / Self Care | Payer: Self-pay | Source: Home / Self Care | Attending: Orthopaedic Surgery

## 2020-09-21 DIAGNOSIS — Z8349 Family history of other endocrine, nutritional and metabolic diseases: Secondary | ICD-10-CM | POA: Diagnosis not present

## 2020-09-21 DIAGNOSIS — Z888 Allergy status to other drugs, medicaments and biological substances status: Secondary | ICD-10-CM | POA: Diagnosis not present

## 2020-09-21 DIAGNOSIS — Z803 Family history of malignant neoplasm of breast: Secondary | ICD-10-CM | POA: Diagnosis not present

## 2020-09-21 DIAGNOSIS — Z8249 Family history of ischemic heart disease and other diseases of the circulatory system: Secondary | ICD-10-CM | POA: Insufficient documentation

## 2020-09-21 DIAGNOSIS — Z79899 Other long term (current) drug therapy: Secondary | ICD-10-CM | POA: Insufficient documentation

## 2020-09-21 DIAGNOSIS — Z96641 Presence of right artificial hip joint: Secondary | ICD-10-CM

## 2020-09-21 DIAGNOSIS — Z7982 Long term (current) use of aspirin: Secondary | ICD-10-CM | POA: Diagnosis not present

## 2020-09-21 DIAGNOSIS — E039 Hypothyroidism, unspecified: Secondary | ICD-10-CM | POA: Diagnosis not present

## 2020-09-21 DIAGNOSIS — Z87891 Personal history of nicotine dependence: Secondary | ICD-10-CM | POA: Insufficient documentation

## 2020-09-21 DIAGNOSIS — M1611 Unilateral primary osteoarthritis, right hip: Secondary | ICD-10-CM | POA: Insufficient documentation

## 2020-09-21 DIAGNOSIS — Z833 Family history of diabetes mellitus: Secondary | ICD-10-CM | POA: Insufficient documentation

## 2020-09-21 DIAGNOSIS — F418 Other specified anxiety disorders: Secondary | ICD-10-CM | POA: Diagnosis not present

## 2020-09-21 DIAGNOSIS — E0500 Thyrotoxicosis with diffuse goiter without thyrotoxic crisis or storm: Secondary | ICD-10-CM | POA: Insufficient documentation

## 2020-09-21 DIAGNOSIS — Z823 Family history of stroke: Secondary | ICD-10-CM | POA: Insufficient documentation

## 2020-09-21 DIAGNOSIS — J45909 Unspecified asthma, uncomplicated: Secondary | ICD-10-CM | POA: Diagnosis not present

## 2020-09-21 DIAGNOSIS — Z419 Encounter for procedure for purposes other than remedying health state, unspecified: Secondary | ICD-10-CM

## 2020-09-21 DIAGNOSIS — Z820 Family history of epilepsy and other diseases of the nervous system: Secondary | ICD-10-CM | POA: Diagnosis not present

## 2020-09-21 DIAGNOSIS — Z471 Aftercare following joint replacement surgery: Secondary | ICD-10-CM | POA: Diagnosis not present

## 2020-09-21 HISTORY — PX: TOTAL HIP ARTHROPLASTY: SHX124

## 2020-09-21 LAB — ABO/RH: ABO/RH(D): A POS

## 2020-09-21 SURGERY — ARTHROPLASTY, HIP, TOTAL, ANTERIOR APPROACH
Anesthesia: Spinal | Site: Hip | Laterality: Right

## 2020-09-21 MED ORDER — ONDANSETRON HCL 4 MG/2ML IJ SOLN
4.0000 mg | Freq: Once | INTRAMUSCULAR | Status: AC | PRN
  Administered 2020-09-21: 4 mg via INTRAVENOUS

## 2020-09-21 MED ORDER — FENTANYL CITRATE (PF) 100 MCG/2ML IJ SOLN
INTRAMUSCULAR | Status: DC | PRN
  Administered 2020-09-21: 100 ug via INTRAVENOUS

## 2020-09-21 MED ORDER — STERILE WATER FOR IRRIGATION IR SOLN
Status: DC | PRN
  Administered 2020-09-21: 2000 mL

## 2020-09-21 MED ORDER — LACTATED RINGERS IV SOLN: INTRAVENOUS | Status: DC

## 2020-09-21 MED ORDER — MEPERIDINE HCL 50 MG/ML IJ SOLN: 6.2500 mg | INTRAMUSCULAR | Status: DC | PRN

## 2020-09-21 MED ORDER — MIDAZOLAM HCL 5 MG/5ML IJ SOLN
INTRAMUSCULAR | Status: DC | PRN
  Administered 2020-09-21: 2 mg via INTRAVENOUS

## 2020-09-21 MED ORDER — AMISULPRIDE (ANTIEMETIC) 5 MG/2ML IV SOLN
INTRAVENOUS | Status: AC
  Administered 2020-09-21: 10 mg
  Filled 2020-09-21: qty 4

## 2020-09-21 MED ORDER — ONDANSETRON HCL 4 MG/2ML IJ SOLN
INTRAMUSCULAR | Status: AC
  Filled 2020-09-21: qty 2

## 2020-09-21 MED ORDER — OXYCODONE HCL 5 MG PO TABS
5.0000 mg | ORAL_TABLET | Freq: Once | ORAL | Status: AC | PRN
  Administered 2020-09-21: 5 mg via ORAL

## 2020-09-21 MED ORDER — OXYCODONE HCL 5 MG PO TABS
ORAL_TABLET | ORAL | Status: AC
  Filled 2020-09-21: qty 1

## 2020-09-21 MED ORDER — FENTANYL CITRATE (PF) 100 MCG/2ML IJ SOLN
INTRAMUSCULAR | Status: AC
  Filled 2020-09-21: qty 2

## 2020-09-21 MED ORDER — CEFAZOLIN SODIUM-DEXTROSE 1-4 GM/50ML-% IV SOLN
1.0000 g | Freq: Once | INTRAVENOUS | Status: AC
  Administered 2020-09-21: 1 g via INTRAVENOUS

## 2020-09-21 MED ORDER — METHOCARBAMOL 500 MG IVPB - SIMPLE MED
INTRAVENOUS | Status: AC
  Administered 2020-09-21: 500 mg via INTRAVENOUS
  Filled 2020-09-21: qty 50

## 2020-09-21 MED ORDER — METHOCARBAMOL 500 MG IVPB - SIMPLE MED: 500.0000 mg | Freq: Four times a day (QID) | INTRAVENOUS | Status: DC | PRN

## 2020-09-21 MED ORDER — ONDANSETRON HCL 4 MG/2ML IJ SOLN
INTRAMUSCULAR | Status: DC | PRN
  Administered 2020-09-21: 4 mg via INTRAVENOUS

## 2020-09-21 MED ORDER — LACTATED RINGERS IV BOLUS
500.0000 mL | Freq: Once | INTRAVENOUS | Status: AC
  Administered 2020-09-21: 500 mL via INTRAVENOUS

## 2020-09-21 MED ORDER — PROPOFOL 500 MG/50ML IV EMUL
INTRAVENOUS | Status: DC | PRN
  Administered 2020-09-21: 75 ug/kg/min via INTRAVENOUS

## 2020-09-21 MED ORDER — MIDAZOLAM HCL 2 MG/2ML IJ SOLN
INTRAMUSCULAR | Status: AC
  Filled 2020-09-21: qty 2

## 2020-09-21 MED ORDER — ACETAMINOPHEN 160 MG/5ML PO SOLN: 325.0000 mg | ORAL | Status: DC | PRN

## 2020-09-21 MED ORDER — KETOROLAC TROMETHAMINE 30 MG/ML IJ SOLN
30.0000 mg | Freq: Once | INTRAMUSCULAR | Status: AC | PRN
  Administered 2020-09-21: 30 mg via INTRAVENOUS

## 2020-09-21 MED ORDER — DEXAMETHASONE SODIUM PHOSPHATE 10 MG/ML IJ SOLN
INTRAMUSCULAR | Status: DC | PRN
  Administered 2020-09-21: 10 mg via INTRAVENOUS

## 2020-09-21 MED ORDER — MEPIVACAINE HCL (PF) 2 % IJ SOLN
INTRAMUSCULAR | Status: DC | PRN
  Administered 2020-09-21: 3 mL via INTRATHECAL

## 2020-09-21 MED ORDER — ORAL CARE MOUTH RINSE
15.0000 mL | Freq: Once | OROMUCOSAL | Status: AC
  Administered 2020-09-21: 15 mL via OROMUCOSAL

## 2020-09-21 MED ORDER — SODIUM CHLORIDE 0.9 % IR SOLN
Status: DC | PRN
  Administered 2020-09-21: 1000 mL

## 2020-09-21 MED ORDER — METHOCARBAMOL 500 MG IVPB - SIMPLE MED
INTRAVENOUS | Status: AC
  Filled 2020-09-21: qty 50

## 2020-09-21 MED ORDER — METHOCARBAMOL 500 MG PO TABS: 500.0000 mg | ORAL_TABLET | Freq: Four times a day (QID) | ORAL | Status: DC | PRN

## 2020-09-21 MED ORDER — KETOROLAC TROMETHAMINE 30 MG/ML IJ SOLN
INTRAMUSCULAR | Status: AC
  Filled 2020-09-21: qty 1

## 2020-09-21 MED ORDER — EPHEDRINE SULFATE-NACL 50-0.9 MG/10ML-% IV SOSY
PREFILLED_SYRINGE | INTRAVENOUS | Status: DC | PRN
  Administered 2020-09-21 (×2): 10 mg via INTRAVENOUS

## 2020-09-21 MED ORDER — FENTANYL CITRATE (PF) 100 MCG/2ML IJ SOLN
25.0000 ug | INTRAMUSCULAR | Status: DC | PRN
  Administered 2020-09-21: 50 ug via INTRAVENOUS

## 2020-09-21 MED ORDER — 0.9 % SODIUM CHLORIDE (POUR BTL) OPTIME
TOPICAL | Status: DC | PRN
  Administered 2020-09-21: 1000 mL

## 2020-09-21 MED ORDER — CHLORHEXIDINE GLUCONATE 0.12 % MT SOLN: 15.0000 mL | Freq: Once | OROMUCOSAL | Status: AC

## 2020-09-21 MED ORDER — PROPOFOL 500 MG/50ML IV EMUL
INTRAVENOUS | Status: DC | PRN
  Administered 2020-09-21: 120 mg via INTRAVENOUS

## 2020-09-21 MED ORDER — POVIDONE-IODINE 10 % EX SWAB
2.0000 "application " | Freq: Once | CUTANEOUS | Status: AC
  Administered 2020-09-21: 2 via TOPICAL

## 2020-09-21 MED ORDER — LACTATED RINGERS IV BOLUS: 250.0000 mL | Freq: Once | INTRAVENOUS | Status: DC

## 2020-09-21 MED ORDER — CEFAZOLIN SODIUM-DEXTROSE 2-4 GM/100ML-% IV SOLN
2.0000 g | INTRAVENOUS | Status: AC
  Administered 2020-09-21: 2 g via INTRAVENOUS
  Filled 2020-09-21: qty 100

## 2020-09-21 MED ORDER — OXYCODONE HCL 5 MG/5ML PO SOLN: 5.0000 mg | Freq: Once | ORAL | Status: AC | PRN

## 2020-09-21 MED ORDER — TRANEXAMIC ACID-NACL 1000-0.7 MG/100ML-% IV SOLN
1000.0000 mg | INTRAVENOUS | Status: AC
  Administered 2020-09-21: 1000 mg via INTRAVENOUS
  Filled 2020-09-21: qty 100

## 2020-09-21 MED ORDER — ACETAMINOPHEN 325 MG PO TABS: 325.0000 mg | ORAL_TABLET | ORAL | Status: DC | PRN

## 2020-09-21 SURGICAL SUPPLY — 42 items
APL SKNCLS STERI-STRIP NONHPOA (GAUZE/BANDAGES/DRESSINGS)
BAG SPEC THK2 15X12 ZIP CLS (MISCELLANEOUS)
BAG ZIPLOCK 12X15 (MISCELLANEOUS) IMPLANT
BENZOIN TINCTURE PRP APPL 2/3 (GAUZE/BANDAGES/DRESSINGS) IMPLANT
BLADE SAW SGTL 18X1.27X75 (BLADE) ×2 IMPLANT
CLEANER TIP ELECTROSURG 2X2 (MISCELLANEOUS) ×1 IMPLANT
COVER PERINEAL POST (MISCELLANEOUS) ×2 IMPLANT
COVER SURGICAL LIGHT HANDLE (MISCELLANEOUS) ×2 IMPLANT
COVER WAND RF STERILE (DRAPES) ×2 IMPLANT
CUP SECTOR GRIPTON 50MM (Cup) ×1 IMPLANT
DRAPE STERI IOBAN 125X83 (DRAPES) ×2 IMPLANT
DRAPE U-SHAPE 47X51 STRL (DRAPES) ×4 IMPLANT
DRSG AQUACEL AG ADV 3.5X10 (GAUZE/BANDAGES/DRESSINGS) ×2 IMPLANT
DURAPREP 26ML APPLICATOR (WOUND CARE) ×2 IMPLANT
ELECT REM PT RETURN 15FT ADLT (MISCELLANEOUS) ×2 IMPLANT
FACESHIELD WRAPAROUND (MASK) ×2 IMPLANT
FACESHIELD WRAPAROUND OR TEAM (MASK) IMPLANT
GAUZE XEROFORM 1X8 LF (GAUZE/BANDAGES/DRESSINGS) ×2 IMPLANT
GLOVE BIO SURGEON STRL SZ7.5 (GLOVE) ×2 IMPLANT
GLOVE ECLIPSE 8.0 STRL XLNG CF (GLOVE) ×2 IMPLANT
GLOVE SRG 8 PF TXTR STRL LF DI (GLOVE) ×2 IMPLANT
GLOVE SURG UNDER POLY LF SZ8 (GLOVE) ×4
GOWN STRL REUS W/TWL XL LVL3 (GOWN DISPOSABLE) ×4 IMPLANT
HANDPIECE INTERPULSE COAX TIP (DISPOSABLE) ×2
HEAD FEMORAL 32 CERAMIC (Hips) ×1 IMPLANT
HOLDER FOLEY CATH W/STRAP (MISCELLANEOUS) ×2 IMPLANT
KIT TURNOVER KIT A (KITS) IMPLANT
LINER ACETABULAR 32X50 (Liner) ×1 IMPLANT
PACK ANTERIOR HIP CUSTOM (KITS) ×2 IMPLANT
PENCIL SMOKE EVACUATOR (MISCELLANEOUS) IMPLANT
SET HNDPC FAN SPRY TIP SCT (DISPOSABLE) ×1 IMPLANT
STAPLER VISISTAT 35W (STAPLE) IMPLANT
STEM CORAIL KA10 (Stem) ×1 IMPLANT
STRIP CLOSURE SKIN 1/2X4 (GAUZE/BANDAGES/DRESSINGS) IMPLANT
SUT ETHIBOND NAB CT1 #1 30IN (SUTURE) ×2 IMPLANT
SUT ETHILON 2 0 PS N (SUTURE) IMPLANT
SUT MNCRL AB 4-0 PS2 18 (SUTURE) IMPLANT
SUT VIC AB 0 CT1 36 (SUTURE) ×2 IMPLANT
SUT VIC AB 1 CT1 36 (SUTURE) ×2 IMPLANT
SUT VIC AB 2-0 CT1 27 (SUTURE) ×4
SUT VIC AB 2-0 CT1 TAPERPNT 27 (SUTURE) ×2 IMPLANT
TRAY FOLEY MTR SLVR 14FR STAT (SET/KITS/TRAYS/PACK) ×1 IMPLANT

## 2020-09-21 NOTE — Progress Notes (Signed)
[place] office RNCM call to patient prior to her Right total hip arthroplasty with Dr. Ninfa Linden today. She is aware and agreeable to same day discharge. Reviewed all post-op care instructions. Patient verbalized she would need a FWW as well as 3in1/BSC. These were ordered through [facility] to be delivered to the patient prior to discharge. Referral also made after choice provided to [facility] for anticipated HHPT needs after discharge. Please contact if any other needs arise regarding discharge. Jamse Arn, Ages.

## 2020-09-21 NOTE — Anesthesia Postprocedure Evaluation (Addendum)
Anesthesia Post Note  Patient: [name]  Procedure(s) Performed: RIGHT TOTAL HIP ARTHROPLASTY ANTERIOR APPROACH (Right Hip)     Patient location during evaluation: Phase II Anesthesia Type: Spinal Level of consciousness: awake Pain management: pain level controlled Vital Signs Assessment: post-procedure vital signs reviewed and stable Respiratory status: spontaneous breathing Cardiovascular status: stable Postop Assessment: no headache, no backache, spinal receding, patient able to bend at knees and no apparent nausea or vomiting Anesthetic complications: yes (PONV)   No complications documented.  Last Vitals:  Vitals:   09/21/20 1300 09/21/20 1305  BP: 111/79 105/80  Pulse: 62 66  Resp: 14   Temp: (!) 36.4 C 36.6 C  SpO2: 100% 100%    Last Pain:  Vitals:   09/21/20 1305  TempSrc:   PainSc: 6                  John F Salome Arnt

## 2020-09-21 NOTE — Evaluation (Signed)
Physical Therapy Evaluation Patient Details Name: [name] MRN: [id number] DOB: Nov 06, 1970 Today's Date: 09/21/2020   History of Present Illness  Pt s/p R THR and with hx of ADD  Clinical Impression  Pt s/p R THR and presents with decreased R LE strength/ROM and post op pain limiting functional mobility.  Pt should progress to dc home with assist of friends and HHPT follow up.    Follow Up Recommendations Home health PT    Equipment Recommendations  Rolling walker with 5" wheels;3in1 (PT)    Recommendations for Other Services       Precautions / Restrictions Precautions Precautions: Fall Restrictions Weight Bearing Restrictions: No Other Position/Activity Restrictions: WBAT      Mobility  Bed Mobility Overal bed mobility: Needs Assistance Bed Mobility: Supine to Sit     Supine to sit: Min guard     General bed mobility comments: cues for sequence and use of L LE to self assist    Transfers Overall transfer level: Needs assistance Equipment used: Rolling walker (2 wheeled) Transfers: Sit to/from Stand Sit to Stand: Min guard;Supervision         General transfer comment: cues for LE management and use of UEs to self assist  Ambulation/Gait Ambulation/Gait assistance: Min assist;Min guard;Supervision Gait Distance (Feet): 150 Feet Assistive device: Rolling walker (2 wheeled) Gait Pattern/deviations: Step-to pattern;Decreased step length - right;Decreased step length - left;Shuffle;Trunk flexed     General Gait Details: cues for sequence, posture and position from [employer]            Wheelchair Mobility    Modified Rankin (Stroke Patients Only)       Balance Overall balance assessment: Needs assistance Sitting-balance support: No upper extremity supported;Feet supported Sitting balance-Leahy Scale: Good     Standing balance support: No upper extremity supported Standing balance-Leahy Scale: Fair                                Pertinent Vitals/Pain Pain Assessment: 0-10 Pain Score: 5  Pain Location: R hip Pain Descriptors / Indicators: Aching;Sore Pain Intervention(s): Limited activity within patient's tolerance;Monitored during session;Premedicated before session;Ice applied    Home Living Family/patient expects to be discharged to:: Private residence Living Arrangements: Alone Available Help at Discharge: Friend(s);Available 24 hours/day Type of Home: House Home Access: Stairs to enter   [employer] of Steps: 1 Home Layout: One level Home Equipment: None      Prior Function Level of Independence: Independent               Hand Dominance        Extremity/Trunk Assessment   Upper Extremity Assessment Upper Extremity Assessment: Overall WFL for tasks assessed    Lower Extremity Assessment Lower Extremity Assessment: RLE deficits/detail    Cervical / Trunk Assessment Cervical / Trunk Assessment: Normal  Communication   Communication: No difficulties  Cognition Arousal/Alertness: Awake/alert Behavior During Therapy: WFL for tasks assessed/performed Overall Cognitive Status: Within Functional Limits for tasks assessed                                        General Comments      Exercises Total Joint Exercises Ankle Circles/Pumps: AROM;Both;15 reps;Supine   Assessment/Plan    PT Assessment Patient needs continued PT services  PT Problem List Decreased strength;Decreased range of motion;Decreased  activity tolerance;Decreased balance;Decreased mobility;Decreased knowledge of use of DME;Pain       PT Treatment Interventions DME instruction;Gait training;Functional mobility training;Stair training;Therapeutic activities;Therapeutic exercise;Patient/family education    PT Goals (Current goals can be found in the Care Plan section)  Acute Rehab PT Goals Patient Stated Goal: Regain IND PT Goal Formulation: All assessment and education complete, DC  therapy    Frequency Min 1X/week   Barriers to discharge        Co-evaluation               AM-PAC PT "6 Clicks" Mobility  Outcome Measure Help needed turning from your back to your side while in a flat bed without using bedrails?: A Little Help needed moving from lying on your back to sitting on the side of a flat bed without using bedrails?: A Little Help needed moving to and from a bed to a chair (including a wheelchair)?: A Little Help needed standing up from a chair using your arms (e.g., wheelchair or bedside chair)?: A Little Help needed to walk in hospital room?: A Little Help needed climbing 3-5 steps with a railing? : A Little 6 Click Score: 18    End of Session Equipment Utilized During Treatment: Gait belt Activity Tolerance: Patient tolerated treatment well Patient left: in chair;with call bell/phone within reach Nurse Communication: Mobility status PT Visit Diagnosis: Difficulty in walking, not elsewhere classified (R26.2)    Time: 4097-3532 PT Time Calculation (min) (ACUTE ONLY): 32 min   Charges:   PT Evaluation $PT Eval Low Complexity: 1 Low PT Treatments $Gait Training: 8-22 mins        [place] Pager [phone] Office [phone]   Jeovanny Cuadros 09/21/2020, 4:17 PM

## 2020-09-21 NOTE — Anesthesia Preprocedure Evaluation (Signed)
Anesthesia Evaluation  Patient identified by MRN, date of birth, ID band Patient awake    Reviewed: Allergy & Precautions, H&P , NPO status , Patient's Chart, lab work & pertinent test results, reviewed documented beta blocker date and time   Airway Mallampati: I       Dental no notable dental hx.    Pulmonary asthma , former smoker,    Pulmonary exam normal        Cardiovascular negative cardio ROS Normal cardiovascular exam     Neuro/Psych PSYCHIATRIC DISORDERS Anxiety Depression negative neurological ROS     GI/Hepatic negative GI ROS, Neg liver ROS,   Endo/Other  Hyperthyroidism   Renal/GU negative Renal ROS  negative genitourinary   Musculoskeletal  (+) Arthritis , Osteoarthritis,    Abdominal Normal abdominal exam  (+)   Peds  Hematology   Anesthesia Other Findings   Reproductive/Obstetrics                             Anesthesia Physical  Anesthesia Plan  ASA: II  Anesthesia Plan: Spinal   Post-op Pain Management:    Induction:   PONV Risk Score and Plan: 3 and Ondansetron, Dexamethasone and Midazolam  Airway Management Planned: Natural Airway and Simple Face Mask  Additional Equipment: None  Intra-op Plan:   Post-operative Plan: Extubation in OR  Informed Consent: I have reviewed the patients History and Physical, chart, labs and discussed the procedure including the risks, benefits and alternatives for the proposed anesthesia with the patient or authorized representative who has indicated his/her understanding and acceptance.       Plan Discussed with: CRNA  Anesthesia Plan Comments:         Anesthesia Quick Evaluation

## 2020-09-21 NOTE — Discharge Instructions (Signed)

## 2020-09-21 NOTE — Brief Op Note (Signed)
09/21/2020  11:22 AM  PATIENT:  Mellody Dance Szymborski  50 y.o. female  PRE-OPERATIVE DIAGNOSIS:  osteoarthritis right hip  POST-OPERATIVE DIAGNOSIS:  osteoarthritis right hip  PROCEDURE:  Procedure(s): RIGHT TOTAL HIP ARTHROPLASTY ANTERIOR APPROACH (Right)  SURGEON:  Surgeon(s) and Role:    Mcarthur Rossetti, MD - Primary  PHYSICIAN ASSISTANT:  Benita Stabile, PA-C  ANESTHESIA:   spinal and general  EBL:  100 mL   COUNTS:  YES  DICTATION: .Other Dictation: Dictation Number [id number]  PLAN OF CARE: Discharge to home after PACU  PATIENT DISPOSITION:  PACU - hemodynamically stable.   Delay start of Pharmacological VTE agent (>24hrs) due to surgical blood loss or risk of bleeding: no

## 2020-09-21 NOTE — Anesthesia Procedure Notes (Signed)
Spinal  Patient location during procedure: OR Start time: 09/21/2020 10:02 AM End time: 09/21/2020 10:04 AM Staffing Performed: anesthesiologist  Anesthesiologist: Lyn Hollingshead, MD Preanesthetic Checklist Completed: patient identified, IV checked, site marked, risks and benefits discussed, surgical consent, monitors and equipment checked, pre-op evaluation and timeout performed Spinal Block Patient position: sitting Prep: DuraPrep and site prepped and draped Patient monitoring: continuous pulse ox and blood pressure Approach: midline Location: L3-4 Injection technique: single-shot Needle Needle type: Pencan  Needle gauge: 24 G Needle length: 10 cm Needle insertion depth: 4 cm Assessment Sensory level: T8

## 2020-09-21 NOTE — Anesthesia Procedure Notes (Signed)
Procedure Name: LMA Insertion Performed by: Elvena Oyer J, CRNA Pre-anesthesia Checklist: Patient identified, Emergency Drugs available, Suction available, Patient being monitored and Timeout performed Patient Re-evaluated:Patient Re-evaluated prior to induction Oxygen Delivery Method: Circle system utilized Preoxygenation: Pre-oxygenation with 100% oxygen Induction Type: IV induction Ventilation: Mask ventilation without difficulty LMA: LMA inserted LMA Size: 4.0 Number of attempts: 1 Placement Confirmation: positive ETCO2 and breath sounds checked- equal and bilateral Tube secured with: Tape Dental Injury: Teeth and Oropharynx as per pre-operative assessment        

## 2020-09-21 NOTE — Interval H&P Note (Signed)
History and Physical Interval Note: The patient understands she is here today for a right total hip arthroplasty to treat the pain from her right hip osteoarthritis.  There has been no interval change in her medical status.  See recent H&P.  The risk and benefits of surgery been explained in detail and informed consent is obtained.  09/21/2020 8:46 AM  [name]  has presented today for surgery, with the diagnosis of osteoarthritis right hip.  The various methods of treatment have been discussed with the patient and family. After consideration of risks, benefits and other options for treatment, the patient has consented to  Procedure(s): RIGHT TOTAL HIP ARTHROPLASTY ANTERIOR APPROACH (Right) as a surgical intervention.  The patient's history has been reviewed, patient examined, no change in status, stable for surgery.  I have reviewed the patient's chart and labs.  Questions were answered to the patient's satisfaction.     Mcarthur Rossetti

## 2020-09-21 NOTE — Transfer of Care (Signed)
Immediate Anesthesia Transfer of Care Note  Patient: [name]  Procedure(s) Performed: RIGHT TOTAL HIP ARTHROPLASTY ANTERIOR APPROACH (Right Hip)  Patient Location: PACU  Anesthesia Type:General  Level of Consciousness: sedated, patient cooperative and responds to stimulation  Airway & Oxygen Therapy: Patient Spontanous Breathing and Patient connected to face mask oxygen  Post-op Assessment: Report given to RN and Post -op Vital signs reviewed and stable  Post vital signs: Reviewed and stable  Last Vitals:  Vitals Value Taken Time  BP 107/70 09/21/20 1140  Temp    Pulse 64 09/21/20 1141  Resp 13 09/21/20 1141  SpO2 100 % 09/21/20 1141  Vitals shown include unvalidated device data.  Last Pain:  Vitals:   09/21/20 0733  TempSrc: Oral         Complications: No complications documented.

## 2020-09-21 NOTE — Progress Notes (Signed)
Patient in phase 2 area of PACU after Right total hip surgery by Dr. Ninfa Linden. When checking on patient, she was in severe pain 10/10 and experiencing nausea as well. Patient writhing in pain, crying and guarding leg. States the back of her leg is in severe pain and she needs pain medicine ASAP. Called and spoke with Dr. Jillyn Hidden and he is okay if patient receives IV Fentanyl, IV Toradol and also Barhemsys for nausea.   Patient received medications and states her pain is much better now. Will report to primary RN and continue to monitor. Patient resting calmly in bed.  Eugene Garnet, RN

## 2020-09-22 DIAGNOSIS — N809 Endometriosis, unspecified: Secondary | ICD-10-CM | POA: Diagnosis not present

## 2020-09-22 DIAGNOSIS — F418 Other specified anxiety disorders: Secondary | ICD-10-CM | POA: Diagnosis not present

## 2020-09-22 DIAGNOSIS — J45909 Unspecified asthma, uncomplicated: Secondary | ICD-10-CM | POA: Diagnosis not present

## 2020-09-22 DIAGNOSIS — E0500 Thyrotoxicosis with diffuse goiter without thyrotoxic crisis or storm: Secondary | ICD-10-CM | POA: Diagnosis not present

## 2020-09-22 DIAGNOSIS — F4320 Adjustment disorder, unspecified: Secondary | ICD-10-CM | POA: Diagnosis not present

## 2020-09-22 DIAGNOSIS — K5900 Constipation, unspecified: Secondary | ICD-10-CM | POA: Diagnosis not present

## 2020-09-22 DIAGNOSIS — Z471 Aftercare following joint replacement surgery: Secondary | ICD-10-CM | POA: Diagnosis not present

## 2020-09-22 DIAGNOSIS — H9313 Tinnitus, bilateral: Secondary | ICD-10-CM | POA: Diagnosis not present

## 2020-09-22 DIAGNOSIS — J309 Allergic rhinitis, unspecified: Secondary | ICD-10-CM | POA: Diagnosis not present

## 2020-09-22 NOTE — Op Note (Signed)
NAME: [name], LIGMAN MEDICAL RECORD [id number] ACCOUNT [id number] DATE OF BIRTH:Apr 22, 1971 FACILITY: [facility] LOCATION: [facility] PHYSICIAN:Mima Cranmore Kerry Fort, MD  OPERATIVE REPORT  DATE OF PROCEDURE:  09/21/2020  PREOPERATIVE DIAGNOSIS:  Primary osteoarthritis and degenerative joint disease, right hip.  POSTOPERATIVE DIAGNOSIS:  Primary osteoarthritis and degenerative joint disease, right hip.  PROCEDURE:  Right total hip arthroplasty through direct anterior approach.  IMPLANTS:  DePuy Sector Gription acetabular component size 50, size 32+0 neutral polyethylene liner, size 10 Corail femoral component with standard offset, size 32+1 ceramic hip ball.  SURGEON:  Lind Guest. Ninfa Linden, MD  ASSISTANT:  Erskine Emery, PA-C.  ANESTHESIA: 1.  Attempted spinal. 2.  General.  ANTIBIOTICS:  Two grams IV Ancef.  ESTIMATED BLOOD LOSS:  100-150 mL.  COMPLICATIONS:  None.  INDICATIONS:  The patient is a 50 year old female with debilitating arthritis involving her right hip that has occurred at such a young age.  Plain films were unremarkable of the hip and an MRI showed extensive degenerative tearing of her acetabular  labrum and wearing of their cartilage at the superolateral aspect of the hip.  Her pain has become debilitating and she did see a hip arthroscopy specialist.  The hip arthroscopy specialist recommended hip replacement because he did not feel that  debridement would be even necessary at this point.  Given the osteoarthritis, there was nothing to repair and perform from an arthroscope standpoint, which I agree with this as well.  She has had relief from steroid injections, but has gotten to where  now her pain is detrimentally affecting her mobility, her quality of life and activities of daily living.  Her pain is daily.  It also hurts on range of motion.  At this point, given the profound detrimental effect this has had on her quality of life,  she does wish to proceed  with a total hip arthroplasty.  We did talk about the risk of acute blood loss anemia, nerve or vessel injury, fracture, infection, dislocation, DVT and implant failure.  We also talked about skin and soft tissue issues.  We  talked about our goals being decreased pain, improve mobility and overall improve quality of life.  DESCRIPTION OF PROCEDURE:  After informed consent was obtained and appropriate right hip was marked.  She was brought to the operating room and sat up on a stretcher where spinal anesthesia was obtained.  Her leg lengths were measured to be equal.  I  placed traction boots on both her feet and a Foley catheter was placed.  She was then placed supine on the Hana fracture table, the perineal post in place and both legs in line skeletal traction device and no traction applied.  Her right upper hip was  prepped and draped with DuraPrep and sterile drapes.  A time-out was called.  She was identified as correct patient, correct right hip.  I then pinched the skin very hard and she actually responded this with withdrawal from pain and so it was better  before I made an incision to transition her to general anesthesia via an LMA.  They were able to accomplish this easily and we could proceed with the case.  We then made an incision just inferior and posterior to the anterior superior iliac spine and  carried this slightly obliquely down the leg.  We dissected down tensor fascia lata muscle.  Tensor fascia was then divided longitudinally to proceed with direct anterior approach to the hip.  We identified and cauterized circumflex vessels and  identified  the hip capsule, opened the hip capsule in an L-type format, finding definitely moderate joint effusion.  I placed a Cobra retractors within the joint capsule around the medial and lateral femoral neck and made our femoral neck cut with an  oscillating saw just proximal to the lesser trochanter.  We completed this with an osteotome.  I placed a  corkscrew guide in the femoral head and removed the femoral head in its entirety and did find a significant degenerative labral tear and we found  actually a little bit worse cartilage wear on the superior lateral aspect of the femoral head with an area of full-thickness cartilage loss, so this depth had made the case more satisfying.  I did look into the acetabulum to see that there was  superolateral acetabular wear as well.  I did remove remnants of the remaining acetabulum with a sharp blade.  I placed a bent Hohmann over the medial acetabular rim and then began reaming under direct visualization from a size 43 reamer in stepwise  increments going up to a size 49.  All reamers placed under direct visualization, the last reamer was also placed under direct fluoroscopy, so I could obtain my depth of reaming by inclination and anteversion.  I then placed the real DePuy Sector  Gription acetabular component size 50.  We went with the 32+0 neutral polyethylene liner.  Attention was then turned to the femur.  With the leg externally rotated to 120 degrees, extended and adducted, we replaced Mueller retractor medially and Hohman  retractor behind the greater trochanter.  We released the lateral joint capsule and used a box-cutting osteotome to enter the femoral canal and a rongeur to lateralize, then began broaching using the Corail broaching system from a size 8 going up to a  size 10.  With a size 10 in place, we trialed a standard offset femoral neck and a 32+1 trial hip ball, we reduced this in the acetabulum.  We were pleased with range of motion offset and stability assessed radiographically and mechanically.  We then  dislocated the hip and removed the trial components.  I placed the real Corail femoral component size 10 with standard offset and the real 32+1 ceramic hip ball and again reduced this in the acetabulum.  We placed again with the leg length, offset, range  of motion and stability assessed  mechanically and radiographically.  We then irrigated the soft tissue with normal saline solution using pulsatile lavage.  We closed the joint capsule with interrupted #1 Ethibond suture, followed by closing the tensor  fascia with #1 Vicryl.  0 Vicryl was used to close deep tissue and 2-0 Vicryl was used to close subcutaneous tissue and staples were used to reapproximate the skin.  An Aquacel dressing was applied.  She was taken off the Hana table, awakened, extubated,  and taken to recovery room in stable condition with all final counts being correct.  No complications noted.  Of note, Benita Stabile, PA-C did assist during the entire case and assistance was crucial for facilitating every aspect of this case.  IN/NUANCE  D:09/21/2020 T:09/22/2020 JOB:[id number]

## 2020-09-24 DIAGNOSIS — E0500 Thyrotoxicosis with diffuse goiter without thyrotoxic crisis or storm: Secondary | ICD-10-CM | POA: Diagnosis not present

## 2020-09-24 DIAGNOSIS — Z471 Aftercare following joint replacement surgery: Secondary | ICD-10-CM | POA: Diagnosis not present

## 2020-09-24 DIAGNOSIS — J309 Allergic rhinitis, unspecified: Secondary | ICD-10-CM | POA: Diagnosis not present

## 2020-09-24 DIAGNOSIS — N809 Endometriosis, unspecified: Secondary | ICD-10-CM | POA: Diagnosis not present

## 2020-09-24 DIAGNOSIS — F4320 Adjustment disorder, unspecified: Secondary | ICD-10-CM | POA: Diagnosis not present

## 2020-09-24 DIAGNOSIS — J45909 Unspecified asthma, uncomplicated: Secondary | ICD-10-CM | POA: Diagnosis not present

## 2020-09-24 DIAGNOSIS — H9313 Tinnitus, bilateral: Secondary | ICD-10-CM | POA: Diagnosis not present

## 2020-09-24 DIAGNOSIS — K5900 Constipation, unspecified: Secondary | ICD-10-CM | POA: Diagnosis not present

## 2020-09-24 DIAGNOSIS — F418 Other specified anxiety disorders: Secondary | ICD-10-CM | POA: Diagnosis not present

## 2020-09-25 ENCOUNTER — Encounter (HOSPITAL_COMMUNITY): Payer: Self-pay | Admitting: Orthopaedic Surgery

## 2020-09-25 DIAGNOSIS — N809 Endometriosis, unspecified: Secondary | ICD-10-CM | POA: Diagnosis not present

## 2020-09-25 DIAGNOSIS — J45909 Unspecified asthma, uncomplicated: Secondary | ICD-10-CM | POA: Diagnosis not present

## 2020-09-25 DIAGNOSIS — J309 Allergic rhinitis, unspecified: Secondary | ICD-10-CM | POA: Diagnosis not present

## 2020-09-25 DIAGNOSIS — Z471 Aftercare following joint replacement surgery: Secondary | ICD-10-CM | POA: Diagnosis not present

## 2020-09-25 DIAGNOSIS — K5900 Constipation, unspecified: Secondary | ICD-10-CM | POA: Diagnosis not present

## 2020-09-25 DIAGNOSIS — F4320 Adjustment disorder, unspecified: Secondary | ICD-10-CM | POA: Diagnosis not present

## 2020-09-25 DIAGNOSIS — E0500 Thyrotoxicosis with diffuse goiter without thyrotoxic crisis or storm: Secondary | ICD-10-CM | POA: Diagnosis not present

## 2020-09-25 DIAGNOSIS — F418 Other specified anxiety disorders: Secondary | ICD-10-CM | POA: Diagnosis not present

## 2020-09-25 DIAGNOSIS — H9313 Tinnitus, bilateral: Secondary | ICD-10-CM | POA: Diagnosis not present

## 2020-09-27 ENCOUNTER — Telehealth: Payer: Self-pay | Admitting: Orthopaedic Surgery

## 2020-09-27 ENCOUNTER — Other Ambulatory Visit: Payer: Self-pay | Admitting: Physician Assistant

## 2020-09-27 DIAGNOSIS — F418 Other specified anxiety disorders: Secondary | ICD-10-CM | POA: Diagnosis not present

## 2020-09-27 DIAGNOSIS — H9313 Tinnitus, bilateral: Secondary | ICD-10-CM | POA: Diagnosis not present

## 2020-09-27 DIAGNOSIS — F4320 Adjustment disorder, unspecified: Secondary | ICD-10-CM | POA: Diagnosis not present

## 2020-09-27 DIAGNOSIS — J309 Allergic rhinitis, unspecified: Secondary | ICD-10-CM | POA: Diagnosis not present

## 2020-09-27 DIAGNOSIS — Z471 Aftercare following joint replacement surgery: Secondary | ICD-10-CM | POA: Diagnosis not present

## 2020-09-27 DIAGNOSIS — K5900 Constipation, unspecified: Secondary | ICD-10-CM | POA: Diagnosis not present

## 2020-09-27 DIAGNOSIS — E0500 Thyrotoxicosis with diffuse goiter without thyrotoxic crisis or storm: Secondary | ICD-10-CM | POA: Diagnosis not present

## 2020-09-27 DIAGNOSIS — N809 Endometriosis, unspecified: Secondary | ICD-10-CM | POA: Diagnosis not present

## 2020-09-27 DIAGNOSIS — J45909 Unspecified asthma, uncomplicated: Secondary | ICD-10-CM | POA: Diagnosis not present

## 2020-09-27 MED ORDER — TRAMADOL HCL 50 MG PO TABS: 50.0000 mg | ORAL_TABLET | Freq: Four times a day (QID) | ORAL | 0 refills | Status: DC | PRN

## 2020-09-27 MED FILL — traMADol HCL 50 MG TABS: 50 | 7 days supply | Qty: 30 | Fill #0

## 2020-09-27 NOTE — Telephone Encounter (Signed)
Patient called requesting a call back from [facility], Dr. Ninfa Linden or Renato Gails. Patient is asking for a non narcotic pain medication. Please call patient at [phone].

## 2020-09-27 NOTE — Telephone Encounter (Signed)
I will send in tramadol. 

## 2020-09-27 NOTE — Telephone Encounter (Signed)
Pt informed and stated understanding

## 2020-10-01 DIAGNOSIS — E0500 Thyrotoxicosis with diffuse goiter without thyrotoxic crisis or storm: Secondary | ICD-10-CM | POA: Diagnosis not present

## 2020-10-01 DIAGNOSIS — F418 Other specified anxiety disorders: Secondary | ICD-10-CM | POA: Diagnosis not present

## 2020-10-01 DIAGNOSIS — K5900 Constipation, unspecified: Secondary | ICD-10-CM | POA: Diagnosis not present

## 2020-10-01 DIAGNOSIS — Z471 Aftercare following joint replacement surgery: Secondary | ICD-10-CM | POA: Diagnosis not present

## 2020-10-01 DIAGNOSIS — F4320 Adjustment disorder, unspecified: Secondary | ICD-10-CM | POA: Diagnosis not present

## 2020-10-01 DIAGNOSIS — N809 Endometriosis, unspecified: Secondary | ICD-10-CM | POA: Diagnosis not present

## 2020-10-01 DIAGNOSIS — J45909 Unspecified asthma, uncomplicated: Secondary | ICD-10-CM | POA: Diagnosis not present

## 2020-10-01 DIAGNOSIS — J309 Allergic rhinitis, unspecified: Secondary | ICD-10-CM | POA: Diagnosis not present

## 2020-10-01 DIAGNOSIS — H9313 Tinnitus, bilateral: Secondary | ICD-10-CM | POA: Diagnosis not present

## 2020-10-03 DIAGNOSIS — F4320 Adjustment disorder, unspecified: Secondary | ICD-10-CM | POA: Diagnosis not present

## 2020-10-03 DIAGNOSIS — E0500 Thyrotoxicosis with diffuse goiter without thyrotoxic crisis or storm: Secondary | ICD-10-CM | POA: Diagnosis not present

## 2020-10-03 DIAGNOSIS — Z471 Aftercare following joint replacement surgery: Secondary | ICD-10-CM | POA: Diagnosis not present

## 2020-10-03 DIAGNOSIS — J309 Allergic rhinitis, unspecified: Secondary | ICD-10-CM | POA: Diagnosis not present

## 2020-10-03 DIAGNOSIS — K5900 Constipation, unspecified: Secondary | ICD-10-CM | POA: Diagnosis not present

## 2020-10-03 DIAGNOSIS — J45909 Unspecified asthma, uncomplicated: Secondary | ICD-10-CM | POA: Diagnosis not present

## 2020-10-03 DIAGNOSIS — F418 Other specified anxiety disorders: Secondary | ICD-10-CM | POA: Diagnosis not present

## 2020-10-03 DIAGNOSIS — H9313 Tinnitus, bilateral: Secondary | ICD-10-CM | POA: Diagnosis not present

## 2020-10-03 DIAGNOSIS — N809 Endometriosis, unspecified: Secondary | ICD-10-CM | POA: Diagnosis not present

## 2020-10-04 ENCOUNTER — Encounter: Payer: Self-pay | Admitting: Orthopaedic Surgery

## 2020-10-04 ENCOUNTER — Ambulatory Visit (INDEPENDENT_AMBULATORY_CARE_PROVIDER_SITE_OTHER): Payer: 59 | Admitting: Orthopaedic Surgery

## 2020-10-04 DIAGNOSIS — Z96641 Presence of right artificial hip joint: Secondary | ICD-10-CM

## 2020-10-04 NOTE — Progress Notes (Signed)
The patient is 2 weeks tomorrow status post a right total hip arthroplasty.  She is doing well overall and has no significant issues.  She has been taking a baby aspirin twice a day and just some tramadol and Robaxin for her pain.  She is still using a cane to ambulate.  Examination of her right hip incision shows it looks good.  I remove the staples in place Steri-Strips.  There is a hematoma but no seroma.  Her leg lengths are equal.  She will go down to 1 baby aspirin a day for the next week and then can stop baby aspirin after that.  She will continue to increase her activities as comfort allows.  I would like to see her back in 4 weeks to see how she is doing overall.

## 2020-10-05 DIAGNOSIS — J45909 Unspecified asthma, uncomplicated: Secondary | ICD-10-CM | POA: Diagnosis not present

## 2020-10-05 DIAGNOSIS — J309 Allergic rhinitis, unspecified: Secondary | ICD-10-CM | POA: Diagnosis not present

## 2020-10-05 DIAGNOSIS — F418 Other specified anxiety disorders: Secondary | ICD-10-CM | POA: Diagnosis not present

## 2020-10-05 DIAGNOSIS — Z471 Aftercare following joint replacement surgery: Secondary | ICD-10-CM | POA: Diagnosis not present

## 2020-10-05 DIAGNOSIS — E0500 Thyrotoxicosis with diffuse goiter without thyrotoxic crisis or storm: Secondary | ICD-10-CM | POA: Diagnosis not present

## 2020-10-05 DIAGNOSIS — K5900 Constipation, unspecified: Secondary | ICD-10-CM | POA: Diagnosis not present

## 2020-10-05 DIAGNOSIS — H9313 Tinnitus, bilateral: Secondary | ICD-10-CM | POA: Diagnosis not present

## 2020-10-05 DIAGNOSIS — F4320 Adjustment disorder, unspecified: Secondary | ICD-10-CM | POA: Diagnosis not present

## 2020-10-05 DIAGNOSIS — N809 Endometriosis, unspecified: Secondary | ICD-10-CM | POA: Diagnosis not present

## 2020-10-09 ENCOUNTER — Other Ambulatory Visit: Payer: Self-pay | Admitting: Family

## 2020-10-09 DIAGNOSIS — M25572 Pain in left ankle and joints of left foot: Secondary | ICD-10-CM | POA: Diagnosis not present

## 2020-10-09 DIAGNOSIS — M5013 Cervical disc disorder with radiculopathy, cervicothoracic region: Secondary | ICD-10-CM | POA: Diagnosis not present

## 2020-10-09 DIAGNOSIS — F909 Attention-deficit hyperactivity disorder, unspecified type: Secondary | ICD-10-CM

## 2020-10-09 DIAGNOSIS — M545 Low back pain, unspecified: Secondary | ICD-10-CM | POA: Diagnosis not present

## 2020-10-09 DIAGNOSIS — M542 Cervicalgia: Secondary | ICD-10-CM | POA: Diagnosis not present

## 2020-10-10 ENCOUNTER — Ambulatory Visit (INDEPENDENT_AMBULATORY_CARE_PROVIDER_SITE_OTHER): Payer: 59 | Admitting: Psychology

## 2020-10-10 ENCOUNTER — Other Ambulatory Visit: Payer: Self-pay | Admitting: Family

## 2020-10-10 DIAGNOSIS — F331 Major depressive disorder, recurrent, moderate: Secondary | ICD-10-CM | POA: Diagnosis not present

## 2020-10-10 MED ORDER — DEXMETHYLPHENIDATE HCL ER 20 MG PO CP24: 20.0000 mg | ORAL_CAPSULE | Freq: Every day | ORAL | 0 refills | Status: DC

## 2020-10-10 MED FILL — DEXMETHYLPHENIDATE HCL ER 2: 20 | 30 days supply | Qty: 30 | Fill #0

## 2020-10-10 NOTE — Telephone Encounter (Signed)
Requesting: Focalin XR 20mg  Contract: 11/15/2019 UDS: 11/10/2019 Last Visit: 05/29/2020 Next Visit: None Last Refill: 09/05/2020 #30 and 0RF   Please Advise

## 2020-10-11 DIAGNOSIS — M25572 Pain in left ankle and joints of left foot: Secondary | ICD-10-CM | POA: Diagnosis not present

## 2020-10-11 DIAGNOSIS — M545 Low back pain, unspecified: Secondary | ICD-10-CM | POA: Diagnosis not present

## 2020-10-11 DIAGNOSIS — M5013 Cervical disc disorder with radiculopathy, cervicothoracic region: Secondary | ICD-10-CM | POA: Diagnosis not present

## 2020-10-11 DIAGNOSIS — M542 Cervicalgia: Secondary | ICD-10-CM | POA: Diagnosis not present

## 2020-10-15 DIAGNOSIS — M25572 Pain in left ankle and joints of left foot: Secondary | ICD-10-CM | POA: Diagnosis not present

## 2020-10-15 DIAGNOSIS — M5013 Cervical disc disorder with radiculopathy, cervicothoracic region: Secondary | ICD-10-CM | POA: Diagnosis not present

## 2020-10-15 DIAGNOSIS — M542 Cervicalgia: Secondary | ICD-10-CM | POA: Diagnosis not present

## 2020-10-15 DIAGNOSIS — M545 Low back pain, unspecified: Secondary | ICD-10-CM | POA: Diagnosis not present

## 2020-10-18 DIAGNOSIS — M542 Cervicalgia: Secondary | ICD-10-CM | POA: Diagnosis not present

## 2020-10-18 DIAGNOSIS — M5013 Cervical disc disorder with radiculopathy, cervicothoracic region: Secondary | ICD-10-CM | POA: Diagnosis not present

## 2020-10-18 DIAGNOSIS — M25572 Pain in left ankle and joints of left foot: Secondary | ICD-10-CM | POA: Diagnosis not present

## 2020-10-18 DIAGNOSIS — M545 Low back pain, unspecified: Secondary | ICD-10-CM | POA: Diagnosis not present

## 2020-10-22 DIAGNOSIS — M5013 Cervical disc disorder with radiculopathy, cervicothoracic region: Secondary | ICD-10-CM | POA: Diagnosis not present

## 2020-10-22 DIAGNOSIS — M542 Cervicalgia: Secondary | ICD-10-CM | POA: Diagnosis not present

## 2020-10-22 DIAGNOSIS — M545 Low back pain, unspecified: Secondary | ICD-10-CM | POA: Diagnosis not present

## 2020-10-22 DIAGNOSIS — M25572 Pain in left ankle and joints of left foot: Secondary | ICD-10-CM | POA: Diagnosis not present

## 2020-10-24 DIAGNOSIS — M25572 Pain in left ankle and joints of left foot: Secondary | ICD-10-CM | POA: Diagnosis not present

## 2020-10-24 DIAGNOSIS — M542 Cervicalgia: Secondary | ICD-10-CM | POA: Diagnosis not present

## 2020-10-24 DIAGNOSIS — M5013 Cervical disc disorder with radiculopathy, cervicothoracic region: Secondary | ICD-10-CM | POA: Diagnosis not present

## 2020-10-24 DIAGNOSIS — M545 Low back pain, unspecified: Secondary | ICD-10-CM | POA: Diagnosis not present

## 2020-10-29 DIAGNOSIS — M545 Low back pain, unspecified: Secondary | ICD-10-CM | POA: Diagnosis not present

## 2020-10-29 DIAGNOSIS — M5013 Cervical disc disorder with radiculopathy, cervicothoracic region: Secondary | ICD-10-CM | POA: Diagnosis not present

## 2020-10-29 DIAGNOSIS — M25572 Pain in left ankle and joints of left foot: Secondary | ICD-10-CM | POA: Diagnosis not present

## 2020-10-29 DIAGNOSIS — M542 Cervicalgia: Secondary | ICD-10-CM | POA: Diagnosis not present

## 2020-11-01 ENCOUNTER — Ambulatory Visit: Payer: 59 | Admitting: Physician Assistant

## 2020-11-01 DIAGNOSIS — M5013 Cervical disc disorder with radiculopathy, cervicothoracic region: Secondary | ICD-10-CM | POA: Diagnosis not present

## 2020-11-01 DIAGNOSIS — M25572 Pain in left ankle and joints of left foot: Secondary | ICD-10-CM | POA: Diagnosis not present

## 2020-11-01 DIAGNOSIS — M542 Cervicalgia: Secondary | ICD-10-CM | POA: Diagnosis not present

## 2020-11-01 DIAGNOSIS — M545 Low back pain, unspecified: Secondary | ICD-10-CM | POA: Diagnosis not present

## 2020-11-05 ENCOUNTER — Ambulatory Visit (INDEPENDENT_AMBULATORY_CARE_PROVIDER_SITE_OTHER): Payer: 59 | Admitting: Physician Assistant

## 2020-11-05 ENCOUNTER — Encounter: Payer: Self-pay | Admitting: Physician Assistant

## 2020-11-05 DIAGNOSIS — Z96641 Presence of right artificial hip joint: Secondary | ICD-10-CM

## 2020-11-05 DIAGNOSIS — N951 Menopausal and female climacteric states: Secondary | ICD-10-CM | POA: Diagnosis not present

## 2020-11-05 NOTE — Progress Notes (Signed)
  HPI: [name] returns today now 6-week status post right total hip arthroplasty.  She is overall doing well.  She is working with therapist for strengthening range of motion.  Main complaint is flexion of the hip.  Otherwise no complaints or concerns.  She notes that her surgical incisions healed well.  Physical exam: Right hip fluid motion slightly limited internal and external rotation.  Calf supple nontender.  Dorsiflexion plantarflexion ankle intact.  Impression: Status post right total hip arthroplasty 09/21/2020  Plan: We will see her back in 6 months postop and obtain an AP pelvis and lateral view of her right hip.  Questions were encouraged and answered at length.  She is activities as tolerated

## 2020-11-06 ENCOUNTER — Ambulatory Visit (INDEPENDENT_AMBULATORY_CARE_PROVIDER_SITE_OTHER): Payer: 59 | Admitting: Psychology

## 2020-11-06 DIAGNOSIS — F331 Major depressive disorder, recurrent, moderate: Secondary | ICD-10-CM

## 2020-11-06 DIAGNOSIS — M5013 Cervical disc disorder with radiculopathy, cervicothoracic region: Secondary | ICD-10-CM | POA: Diagnosis not present

## 2020-11-06 DIAGNOSIS — M25572 Pain in left ankle and joints of left foot: Secondary | ICD-10-CM | POA: Diagnosis not present

## 2020-11-06 DIAGNOSIS — M545 Low back pain, unspecified: Secondary | ICD-10-CM | POA: Diagnosis not present

## 2020-11-06 DIAGNOSIS — M542 Cervicalgia: Secondary | ICD-10-CM | POA: Diagnosis not present

## 2020-11-07 DIAGNOSIS — N951 Menopausal and female climacteric states: Secondary | ICD-10-CM | POA: Diagnosis not present

## 2020-11-07 DIAGNOSIS — R232 Flushing: Secondary | ICD-10-CM | POA: Diagnosis not present

## 2020-11-07 DIAGNOSIS — G479 Sleep disorder, unspecified: Secondary | ICD-10-CM | POA: Diagnosis not present

## 2020-11-07 DIAGNOSIS — Z6826 Body mass index (BMI) 26.0-26.9, adult: Secondary | ICD-10-CM | POA: Diagnosis not present

## 2020-11-08 DIAGNOSIS — M25572 Pain in left ankle and joints of left foot: Secondary | ICD-10-CM | POA: Diagnosis not present

## 2020-11-08 DIAGNOSIS — M542 Cervicalgia: Secondary | ICD-10-CM | POA: Diagnosis not present

## 2020-11-08 DIAGNOSIS — M545 Low back pain, unspecified: Secondary | ICD-10-CM | POA: Diagnosis not present

## 2020-11-08 DIAGNOSIS — M5013 Cervical disc disorder with radiculopathy, cervicothoracic region: Secondary | ICD-10-CM | POA: Diagnosis not present

## 2020-11-12 ENCOUNTER — Telehealth: Payer: Self-pay | Admitting: Orthopaedic Surgery

## 2020-11-12 ENCOUNTER — Other Ambulatory Visit: Payer: Self-pay | Admitting: Family

## 2020-11-12 DIAGNOSIS — F909 Attention-deficit hyperactivity disorder, unspecified type: Secondary | ICD-10-CM

## 2020-11-12 DIAGNOSIS — M545 Low back pain, unspecified: Secondary | ICD-10-CM | POA: Diagnosis not present

## 2020-11-12 DIAGNOSIS — M542 Cervicalgia: Secondary | ICD-10-CM | POA: Diagnosis not present

## 2020-11-12 DIAGNOSIS — M5013 Cervical disc disorder with radiculopathy, cervicothoracic region: Secondary | ICD-10-CM | POA: Diagnosis not present

## 2020-11-12 DIAGNOSIS — M25572 Pain in left ankle and joints of left foot: Secondary | ICD-10-CM | POA: Diagnosis not present

## 2020-11-12 NOTE — Telephone Encounter (Signed)
Received call from patient. Gave verbal auth to speak to/release to [employer] per [employer] to do, assuming they are going to be requesting info. I advised her we haven't received anything since forms completed in Feb. I advised her to call me back in a day or 2 to check if anything received. If not, urged her to contact them to reach out to [place] for what they need.

## 2020-11-14 ENCOUNTER — Other Ambulatory Visit: Payer: Self-pay | Admitting: Family

## 2020-11-14 DIAGNOSIS — F909 Attention-deficit hyperactivity disorder, unspecified type: Secondary | ICD-10-CM

## 2020-11-14 MED ORDER — DEXMETHYLPHENIDATE HCL ER 20 MG PO CP24: 20.0000 mg | ORAL_CAPSULE | Freq: Every day | ORAL | 0 refills | Status: DC

## 2020-11-14 MED FILL — DEXMETHYLPHENIDATE HCL ER 2: 20 | 30 days supply | Qty: 30 | Fill #0

## 2020-11-14 NOTE — Telephone Encounter (Signed)
Requesting: Focalin XR Contract: 11/15/19 UDS: 11/15/19 Last Visit: 04/11/20 Next Visit: nothing scheduled Last Refill:  10/10/20  Please Advise

## 2020-11-14 NOTE — Telephone Encounter (Signed)
Patient advised that refill was sent in and she needed follow up. She will like to schedule in person "she may need blood work" Patient was scheduled to come in 11-20-2020.

## 2020-11-14 NOTE — Telephone Encounter (Signed)
This is a duplicate request.  A request has already been sent.  Can you delete this one?  We are unable to refuse narcotics.

## 2020-11-14 NOTE — Telephone Encounter (Signed)
Needs follow up visit please. Virtual is OK- I know she just had surgery.  I am sending 1 refill now.

## 2020-11-16 DIAGNOSIS — M25572 Pain in left ankle and joints of left foot: Secondary | ICD-10-CM | POA: Diagnosis not present

## 2020-11-16 DIAGNOSIS — M542 Cervicalgia: Secondary | ICD-10-CM | POA: Diagnosis not present

## 2020-11-16 DIAGNOSIS — M545 Low back pain, unspecified: Secondary | ICD-10-CM | POA: Diagnosis not present

## 2020-11-16 DIAGNOSIS — M5013 Cervical disc disorder with radiculopathy, cervicothoracic region: Secondary | ICD-10-CM | POA: Diagnosis not present

## 2020-11-20 ENCOUNTER — Other Ambulatory Visit (HOSPITAL_COMMUNITY): Payer: Self-pay

## 2020-11-20 ENCOUNTER — Other Ambulatory Visit: Payer: 59

## 2020-11-20 ENCOUNTER — Telehealth (INDEPENDENT_AMBULATORY_CARE_PROVIDER_SITE_OTHER): Payer: 59 | Admitting: Family

## 2020-11-20 VITALS — HR 78

## 2020-11-20 DIAGNOSIS — Z0000 Encounter for general adult medical examination without abnormal findings: Secondary | ICD-10-CM

## 2020-11-20 DIAGNOSIS — F909 Attention-deficit hyperactivity disorder, unspecified type: Secondary | ICD-10-CM | POA: Diagnosis not present

## 2020-11-20 DIAGNOSIS — R300 Dysuria: Secondary | ICD-10-CM

## 2020-11-20 DIAGNOSIS — M542 Cervicalgia: Secondary | ICD-10-CM | POA: Diagnosis not present

## 2020-11-20 DIAGNOSIS — M545 Low back pain, unspecified: Secondary | ICD-10-CM | POA: Diagnosis not present

## 2020-11-20 DIAGNOSIS — M25572 Pain in left ankle and joints of left foot: Secondary | ICD-10-CM | POA: Diagnosis not present

## 2020-11-20 DIAGNOSIS — F418 Other specified anxiety disorders: Secondary | ICD-10-CM | POA: Diagnosis not present

## 2020-11-20 DIAGNOSIS — M5013 Cervical disc disorder with radiculopathy, cervicothoracic region: Secondary | ICD-10-CM | POA: Diagnosis not present

## 2020-11-20 MED ORDER — BUPROPION HCL ER (XL) 150 MG PO TB24
1.0000 | ORAL_TABLET | Freq: Every day | ORAL | 1 refills | Status: DC
  Filled 2020-11-20: qty 90, 90d supply, fill #0
  Filled 2021-02-23: qty 90, 90d supply, fill #1

## 2020-11-20 NOTE — Progress Notes (Signed)
  Virtual Visit via Video Note  I connected with [name] on 11/20/20 at  9:40 AM EDT by a video enabled telemedicine application and verified that I am speaking with the correct person using two identifiers.  Location: Patient: home Provider: work   I discussed the limitations of evaluation and management by telemedicine and the availability of in person appointments. The patient expressed understanding and agreed to proceed. Only the patient and myself were present for today's video call.   History of Present Illness:  Patient is a 50 year old female who presents today for routine follow-up.  Since her last visit, she underwent a right total hip replacement on September 21, 2020.  Her postop course has been unremarkable and she is actually back to hiking.  States that she hiked nearly 7 miles this past weekend.   ADHD- continues focalin XR.  Focus remains good on current dose.  She reports that she is returning to work next week.   Depression- reports mood is stable on Wellbutrin xl 150 mg. Continues monthly therapy visits.   She does report a several day history of some mild dysuria and is requesting urine analysis culture to evaluate for possible UTI.  Observations/Objective:   Gen: Awake, alert, no acute distress Resp: Breathing is even and non-labored Psych: calm/pleasant demeanor Neuro: Alert and Oriented x 3, + facial symmetry, speech is clear.   Assessment and Plan:  ADHD-currently stable on Focalin XR 20mg  once daily.  Continue current dosing.  She is due for follow-up urine drug screen and updated contract.  She will come to the lab this afternoon could to complete both of those tasks.  Depression/anxiety-mood is stable on Wellbutrin XL 150.  Continue current dose.  Dysuria-new.  Will obtain urinalysis with micro and urine culture for further evaluation.  Plan to treat with antibiotics if needed.  She is due for colonoscopy and a referral has been  placed.  Follow Up Instructions:    I discussed the assessment and treatment plan with the patient. The patient was provided an opportunity to ask questions and all were answered. The patient agreed with the plan and demonstrated an understanding of the instructions.   The patient was advised to call back or seek an in-person evaluation if the symptoms worsen or if the condition fails to improve as anticipated.  Nance Pear, NP

## 2020-11-21 ENCOUNTER — Other Ambulatory Visit (HOSPITAL_COMMUNITY): Payer: Self-pay

## 2020-11-21 ENCOUNTER — Other Ambulatory Visit (INDEPENDENT_AMBULATORY_CARE_PROVIDER_SITE_OTHER): Payer: 59

## 2020-11-21 ENCOUNTER — Other Ambulatory Visit: Payer: Self-pay

## 2020-11-21 ENCOUNTER — Encounter: Payer: Self-pay | Admitting: Family

## 2020-11-21 DIAGNOSIS — R300 Dysuria: Secondary | ICD-10-CM

## 2020-11-21 DIAGNOSIS — F909 Attention-deficit hyperactivity disorder, unspecified type: Secondary | ICD-10-CM

## 2020-11-21 MED ORDER — CEPHALEXIN 500 MG PO CAPS
500.0000 mg | ORAL_CAPSULE | Freq: Three times a day (TID) | ORAL | 0 refills | Status: AC
  Filled 2020-11-21: qty 15, 5d supply, fill #0

## 2020-11-22 ENCOUNTER — Other Ambulatory Visit (HOSPITAL_COMMUNITY): Payer: Self-pay

## 2020-11-22 ENCOUNTER — Encounter: Payer: Self-pay | Admitting: Family

## 2020-11-22 ENCOUNTER — Encounter: Payer: Self-pay | Admitting: Orthopaedic Surgery

## 2020-11-22 DIAGNOSIS — M25572 Pain in left ankle and joints of left foot: Secondary | ICD-10-CM | POA: Diagnosis not present

## 2020-11-22 DIAGNOSIS — M542 Cervicalgia: Secondary | ICD-10-CM | POA: Diagnosis not present

## 2020-11-22 DIAGNOSIS — M5013 Cervical disc disorder with radiculopathy, cervicothoracic region: Secondary | ICD-10-CM | POA: Diagnosis not present

## 2020-11-22 DIAGNOSIS — M545 Low back pain, unspecified: Secondary | ICD-10-CM | POA: Diagnosis not present

## 2020-11-22 LAB — URINE CULTURE
MICRO NUMBER:: 11738237
SPECIMEN QUALITY:: ADEQUATE

## 2020-11-22 LAB — URINALYSIS, ROUTINE W REFLEX MICROSCOPIC
Bilirubin Urine: NEGATIVE
Hgb urine dipstick: NEGATIVE
Ketones, ur: NEGATIVE
Nitrite: POSITIVE — AB
RBC / HPF: NONE SEEN (ref 0–?)
Specific Gravity, Urine: <=1.005 — AB (ref 1.000–1.030)
Total Protein, Urine: NEGATIVE
Urine Glucose: NEGATIVE
Urobilinogen, UA: 1 (ref 0.0–1.0)
pH: 7 (ref 5.0–8.0)

## 2020-11-22 LAB — DRUG MONITORING, PANEL 8 WITH CONFIRMATION, URINE
6 Acetylmorphine: NEGATIVE ng/mL (ref <10)
Alcohol Metabolites: NEGATIVE ng/mL
Amphetamines: NEGATIVE ng/mL (ref <500)
Benzodiazepines: NEGATIVE ng/mL (ref <100)
Buprenorphine, Urine: NEGATIVE ng/mL (ref <5)
Cocaine Metabolite: NEGATIVE ng/mL (ref <150)
Creatinine: 24.8 mg/dL
MDMA: NEGATIVE ng/mL (ref <500)
Marijuana Metabolite: NEGATIVE ng/mL (ref <20)
Opiates: NEGATIVE ng/mL (ref <100)
Oxidant: NEGATIVE ug/mL
Oxycodone: NEGATIVE ng/mL (ref <100)
pH: 7.2 (ref 4.5–9.0)

## 2020-11-22 LAB — DM TEMPLATE

## 2020-11-22 MED ORDER — FLUCONAZOLE 150 MG PO TABS
ORAL_TABLET | ORAL | 0 refills | Status: DC
  Filled 2020-11-22: qty 2, 3d supply, fill #0

## 2020-11-26 DIAGNOSIS — M542 Cervicalgia: Secondary | ICD-10-CM | POA: Diagnosis not present

## 2020-11-26 DIAGNOSIS — M25572 Pain in left ankle and joints of left foot: Secondary | ICD-10-CM | POA: Diagnosis not present

## 2020-11-26 DIAGNOSIS — M545 Low back pain, unspecified: Secondary | ICD-10-CM | POA: Diagnosis not present

## 2020-11-26 DIAGNOSIS — M5013 Cervical disc disorder with radiculopathy, cervicothoracic region: Secondary | ICD-10-CM | POA: Diagnosis not present

## 2020-11-28 DIAGNOSIS — M5013 Cervical disc disorder with radiculopathy, cervicothoracic region: Secondary | ICD-10-CM | POA: Diagnosis not present

## 2020-11-28 DIAGNOSIS — M25572 Pain in left ankle and joints of left foot: Secondary | ICD-10-CM | POA: Diagnosis not present

## 2020-11-28 DIAGNOSIS — M545 Low back pain, unspecified: Secondary | ICD-10-CM | POA: Diagnosis not present

## 2020-11-28 DIAGNOSIS — M542 Cervicalgia: Secondary | ICD-10-CM | POA: Diagnosis not present

## 2020-12-04 ENCOUNTER — Ambulatory Visit (INDEPENDENT_AMBULATORY_CARE_PROVIDER_SITE_OTHER): Payer: 59 | Admitting: Psychology

## 2020-12-04 DIAGNOSIS — F331 Major depressive disorder, recurrent, moderate: Secondary | ICD-10-CM

## 2020-12-20 ENCOUNTER — Other Ambulatory Visit: Payer: Self-pay | Admitting: Family

## 2020-12-20 DIAGNOSIS — F909 Attention-deficit hyperactivity disorder, unspecified type: Secondary | ICD-10-CM

## 2020-12-21 ENCOUNTER — Other Ambulatory Visit: Payer: Self-pay

## 2020-12-21 ENCOUNTER — Other Ambulatory Visit (HOSPITAL_COMMUNITY): Payer: Self-pay

## 2020-12-21 MED ORDER — DEXMETHYLPHENIDATE HCL ER 20 MG PO CP24
20.0000 mg | ORAL_CAPSULE | Freq: Every day | ORAL | 0 refills | Status: DC
Start: 2020-12-21 — End: 2021-01-11
  Filled 2020-12-21: qty 30, 30d supply, fill #0

## 2021-01-09 ENCOUNTER — Ambulatory Visit (INDEPENDENT_AMBULATORY_CARE_PROVIDER_SITE_OTHER): Payer: 59 | Admitting: Psychology

## 2021-01-09 DIAGNOSIS — F331 Major depressive disorder, recurrent, moderate: Secondary | ICD-10-CM

## 2021-01-11 ENCOUNTER — Other Ambulatory Visit (HOSPITAL_COMMUNITY): Payer: Self-pay

## 2021-01-11 ENCOUNTER — Other Ambulatory Visit: Payer: Self-pay | Admitting: Family

## 2021-01-11 DIAGNOSIS — F909 Attention-deficit hyperactivity disorder, unspecified type: Secondary | ICD-10-CM

## 2021-01-11 MED ORDER — DEXMETHYLPHENIDATE HCL ER 20 MG PO CP24
20.0000 mg | ORAL_CAPSULE | Freq: Every day | ORAL | 0 refills | Status: DC
  Filled 2021-01-11 – 2021-01-23 (×2): qty 30, 30d supply, fill #0

## 2021-01-23 ENCOUNTER — Other Ambulatory Visit (HOSPITAL_COMMUNITY): Payer: Self-pay

## 2021-02-15 ENCOUNTER — Ambulatory Visit (INDEPENDENT_AMBULATORY_CARE_PROVIDER_SITE_OTHER): Payer: 59 | Admitting: Psychology

## 2021-02-15 DIAGNOSIS — F331 Major depressive disorder, recurrent, moderate: Secondary | ICD-10-CM

## 2021-02-20 ENCOUNTER — Telehealth: Payer: 59 | Admitting: Family

## 2021-02-20 DIAGNOSIS — R399 Unspecified symptoms and signs involving the genitourinary system: Secondary | ICD-10-CM

## 2021-02-20 MED ORDER — CEPHALEXIN 500 MG PO CAPS: 500.0000 mg | ORAL_CAPSULE | Freq: Two times a day (BID) | ORAL | 0 refills | Status: DC

## 2021-02-20 MED ORDER — FLUCONAZOLE 150 MG PO TABS
150.0000 mg | ORAL_TABLET | ORAL | 0 refills | Status: DC | PRN
Start: 2021-02-20 — End: 2021-05-16

## 2021-02-20 NOTE — Progress Notes (Signed)
E-Visit for Urinary Problems  We are sorry that you are not feeling well.  Here is how we plan to help!  Based on what you shared with me it looks like you most likely have a simple urinary tract infection.  A UTI (Urinary Tract Infection) is a bacterial infection of the bladder.  Most cases of urinary tract infections are simple to treat but a key part of your care is to encourage you to drink plenty of fluids and watch your symptoms carefully.  I have prescribed Keflex 500 mg twice a day for 7 days.  Your symptoms should gradually improve. Call us if the burning in your urine worsens, you develop worsening fever, back pain or pelvic pain or if your symptoms do not resolve after completing the antibiotic.  I have also sent in diflucan.   Urinary tract infections can be prevented by drinking plenty of water to keep your body hydrated.  Also be sure when you wipe, wipe from front to back and don't hold it in!  If possible, empty your bladder every 4 hours.  HOME CARE Drink plenty of fluids Compete the full course of the antibiotics even if the symptoms resolve Remember, when you need to go.go. Holding in your urine can increase the likelihood of getting a UTI! GET HELP RIGHT AWAY IF: You cannot urinate You get a high fever Worsening back pain occurs You see blood in your urine You feel sick to your stomach or throw up You feel like you are going to pass out  MAKE SURE YOU  Understand these instructions. Will watch your condition. Will get help right away if you are not doing well or get worse.   Thank you for choosing an e-visit.  Your e-visit answers were reviewed by a board certified advanced clinical practitioner to complete your personal care plan. Depending upon the condition, your plan could have included both over the counter or prescription medications.  Please review your pharmacy choice. Make sure the pharmacy is open so you can pick up prescription now. If there is a  problem, you may contact your provider through [employer] and have the prescription routed to another pharmacy.  Your safety is important to [place]. If you have drug allergies check your prescription carefully.   For the next 24 hours you can use MyChart to ask questions about today's visit, request a non-urgent call back, or ask for a work or school excuse. You will get an email in the next two days asking about your experience. I hope that your e-visit has been valuable and will speed your recovery.  Approximately 5 minutes was spent documenting and reviewing patient's chart.

## 2021-02-23 ENCOUNTER — Other Ambulatory Visit: Payer: Self-pay | Admitting: Family

## 2021-02-23 DIAGNOSIS — F909 Attention-deficit hyperactivity disorder, unspecified type: Secondary | ICD-10-CM

## 2021-02-25 ENCOUNTER — Other Ambulatory Visit (HOSPITAL_COMMUNITY): Payer: Self-pay

## 2021-02-25 NOTE — Telephone Encounter (Signed)
Requesting: Focalin XR Contract: 11/21/20 UDS: 11/21/20 Last OV: 11/20/20 Next OV: N/A Last Refill: 01/11/21, #30--0 RF Database:   Please advise

## 2021-02-26 ENCOUNTER — Other Ambulatory Visit (HOSPITAL_COMMUNITY): Payer: Self-pay

## 2021-02-26 MED ORDER — DEXMETHYLPHENIDATE HCL ER 20 MG PO CP24
20.0000 mg | ORAL_CAPSULE | Freq: Every day | ORAL | 0 refills | Status: DC
  Filled 2021-02-26: qty 30, 30d supply, fill #0

## 2021-03-03 ENCOUNTER — Telehealth: Payer: 59 | Admitting: Orthopedic Surgery

## 2021-03-03 DIAGNOSIS — U071 COVID-19: Secondary | ICD-10-CM

## 2021-03-03 MED ORDER — MOLNUPIRAVIR EUA 200MG CAPSULE
4.0000 | ORAL_CAPSULE | Freq: Two times a day (BID) | ORAL | 0 refills | Status: AC
  Filled 2021-03-03: qty 40, 5d supply, fill #0

## 2021-03-03 MED ORDER — FLUTICASONE PROPIONATE 50 MCG/ACT NA SUSP: 2.0000 | Freq: Every day | NASAL | 0 refills | Status: AC

## 2021-03-03 NOTE — Progress Notes (Signed)
Virtual Visit Consent   [name], you are scheduled for a virtual visit with a [facility] provider today.     Just as with appointments in the office, your consent must be obtained to participate.  Your consent will be active for this visit and any virtual visit you may have with one of our providers in the next 365 days.     If you have a MyChart account, a copy of this consent can be sent to you electronically.  All virtual visits are billed to your insurance company just like a traditional visit in the office.    As this is a virtual visit, video technology does not allow for your provider to perform a traditional examination.  This may limit your provider's ability to fully assess your condition.  If your provider identifies any concerns that need to be evaluated in person or the need to arrange testing (such as labs, EKG, etc.), we will make arrangements to do so.     Although advances in technology are sophisticated, we cannot ensure that it will always work on either your end or our end.  If the connection with a video visit is poor, the visit may have to be switched to a telephone visit.  With either a video or telephone visit, we are not always able to ensure that we have a secure connection.     I need to obtain your verbal consent now.   Are you willing to proceed with your visit today? Yes   [name] has provided verbal consent on 03/03/2021 for a virtual visit (video or telephone).   [name], PA-C   Date: 03/03/2021 1:06 PM   Virtual Visit via Video Note   I, [name], connected with  [name]  ([id number], 04-28-1971) on 03/03/21 at  1:00 PM EDT by a video-enabled telemedicine application and verified that I am speaking with the correct person using two identifiers.  Location: Patient: Virtual Visit Location Patient: Home Provider: Virtual Visit Location Provider: Home Office   I discussed the limitations of evaluation and management  by telemedicine and the availability of in person appointments. The patient expressed understanding and agreed to proceed.    History of Present Illness: [name] is a 50 y.o. who identifies as a female who was assigned female at birth, and is being seen today for Covid infection. She began to have some congestion Friday night and has been feeling bad through the weekend. She has taken several tests but today's was finally positive. She has also had some achiness.  HPI: HPI  Problems:  Patient Active Problem List   Diagnosis Date Noted   Status post total replacement of right hip 10/04/2020   Unilateral primary osteoarthritis, right hip 07/17/2020   Pulmonary nodules/lesions, multiple 10/26/2015   Preventative health care 10/23/2015   Irregular menses 06/14/2014   Cervical pain (neck) 05/03/2014   Weight gain 03/29/2012   Depression with anxiety 03/07/2011   Attention deficit disorder 08/31/2007   GRAVE'S DISEASE 02/23/2007   HYPERTHYROIDISM 02/23/2007   Allergic rhinitis 02/23/2007   Asthma 02/23/2007    Allergies:  Allergies  Allergen Reactions   Escitalopram Oxalate Other (See Comments)    Causes tremors.   Montelukast Sodium Swelling    Causes swelling and pain in the joints.   Paroxetine Hcl     Too high of a dose makes depressed    Wellbutrin [Bupropion] Other (See Comments)    Did  well on a low dose but higher dose caused depression.   Prednisone Anxiety and Other (See Comments)    Hot flashes, emotional upset   Medications:  Current Outpatient Medications:    buPROPion (WELLBUTRIN XL) 150 MG 24 hr tablet, TAKE 1 TABLET (150 MG TOTAL) BY MOUTH DAILY., Disp: 90 tablet, Rfl: 1   cephALEXin (KEFLEX) 500 MG capsule, Take 1 capsule (500 mg total) by mouth 2 (two) times daily., Disp: 14 capsule, Rfl: 0   dexmethylphenidate (FOCALIN XR) 20 MG 24 hr capsule, Take 1 capsule (20 mg total) by mouth daily., Disp: 30 capsule, Rfl: 0   DHEA 50 MG CAPS, Take 50 mg by mouth  daily., Disp: , Rfl:    fexofenadine (ALLEGRA) 180 MG tablet, Take 180 mg by mouth daily as needed for allergies., Disp: , Rfl:    fluconazole (DIFLUCAN) 150 MG tablet, Take 1 tablet (150 mg total) by mouth every three (3) days as needed., Disp: 3 tablet, Rfl: 0   fluticasone (FLONASE) 50 MCG/ACT nasal spray, Place 2 sprays into both nostrils daily. (Patient taking differently: Place 2 sprays into both nostrils daily as needed for allergies.), Disp: 16 g, Rfl: 6   Multiple Vitamins-Minerals (MULTIVITAMIN WITH MINERALS) tablet, Take 1 tablet by mouth daily., Disp: , Rfl:    Nutritional Supplements (JUICE PLUS FIBRE PO), Take 2 capsules by mouth daily., Disp: , Rfl:    Probiotic Product (PROBIOTIC DAILY PO), Take 1 capsule by mouth daily., Disp: , Rfl:   Observations/Objective: Patient is well-developed, well-nourished in no acute distress.  Resting comfortably  at home.  Head is normocephalic, atraumatic.  No labored breathing.  Speech is clear and coherent with logical content.  Patient is alert and oriented at baseline.    Assessment and Plan: 1. COVID -- Will prescribe molnupavir and flonase.   Follow Up Instructions: I discussed the assessment and treatment plan with the patient. The patient was provided an opportunity to ask questions and all were answered. The patient agreed with the plan and demonstrated an understanding of the instructions.  A copy of instructions were sent to the patient via MyChart.  The patient was advised to call back or seek an in-person evaluation if the symptoms worsen or if the condition fails to improve as anticipated.  Time:  I spent 15 minutes with the patient via telehealth technology discussing the above problems/concerns.    [name], PA-C

## 2021-03-04 ENCOUNTER — Other Ambulatory Visit (HOSPITAL_COMMUNITY): Payer: Self-pay

## 2021-03-05 ENCOUNTER — Other Ambulatory Visit (HOSPITAL_COMMUNITY): Payer: Self-pay

## 2021-03-08 ENCOUNTER — Other Ambulatory Visit (HOSPITAL_COMMUNITY): Payer: Self-pay

## 2021-03-12 ENCOUNTER — Institutional Professional Consult (permissible substitution): Payer: 59 | Admitting: Plastic Surgery

## 2021-04-02 ENCOUNTER — Encounter: Payer: Self-pay | Admitting: Family

## 2021-04-02 DIAGNOSIS — F909 Attention-deficit hyperactivity disorder, unspecified type: Secondary | ICD-10-CM

## 2021-04-03 ENCOUNTER — Other Ambulatory Visit (HOSPITAL_COMMUNITY): Payer: Self-pay

## 2021-04-03 MED ORDER — DEXMETHYLPHENIDATE HCL ER 20 MG PO CP24
20.0000 mg | ORAL_CAPSULE | Freq: Every day | ORAL | 0 refills | Status: DC
  Filled 2021-04-03: qty 30, 30d supply, fill #0

## 2021-04-03 NOTE — Telephone Encounter (Signed)
Requesting: Focalin XR '20mg'$  Contract: 11/10/2019 UDS: 11/21/2020 Last Visit: 11/20/2020 Next Visit: None Last Refill: 02/26/2021 #30 and 0RF  Please Advise

## 2021-04-10 DIAGNOSIS — M25572 Pain in left ankle and joints of left foot: Secondary | ICD-10-CM | POA: Diagnosis not present

## 2021-04-10 DIAGNOSIS — M5013 Cervical disc disorder with radiculopathy, cervicothoracic region: Secondary | ICD-10-CM | POA: Diagnosis not present

## 2021-04-10 DIAGNOSIS — M542 Cervicalgia: Secondary | ICD-10-CM | POA: Diagnosis not present

## 2021-04-10 DIAGNOSIS — M545 Low back pain, unspecified: Secondary | ICD-10-CM | POA: Diagnosis not present

## 2021-04-11 ENCOUNTER — Ambulatory Visit (INDEPENDENT_AMBULATORY_CARE_PROVIDER_SITE_OTHER): Payer: 59 | Admitting: Psychology

## 2021-04-11 DIAGNOSIS — F331 Major depressive disorder, recurrent, moderate: Secondary | ICD-10-CM

## 2021-04-29 ENCOUNTER — Telehealth: Payer: Self-pay | Admitting: Orthopaedic Surgery

## 2021-04-29 NOTE — Telephone Encounter (Signed)
Pt states that she is having a lot of problems with her hip that she had surgery on in January. She states she can't even bend down or sit. She is fine while walking, its just getting into that position that causes her pain and discomfort. She doesn't think she can wait until the 27th and would like to come this week if possible   CB [phone]

## 2021-05-01 DIAGNOSIS — M25572 Pain in left ankle and joints of left foot: Secondary | ICD-10-CM | POA: Diagnosis not present

## 2021-05-01 DIAGNOSIS — M545 Low back pain, unspecified: Secondary | ICD-10-CM | POA: Diagnosis not present

## 2021-05-01 DIAGNOSIS — M542 Cervicalgia: Secondary | ICD-10-CM | POA: Diagnosis not present

## 2021-05-01 DIAGNOSIS — M5013 Cervical disc disorder with radiculopathy, cervicothoracic region: Secondary | ICD-10-CM | POA: Diagnosis not present

## 2021-05-06 ENCOUNTER — Ambulatory Visit (INDEPENDENT_AMBULATORY_CARE_PROVIDER_SITE_OTHER): Payer: 59

## 2021-05-06 ENCOUNTER — Telehealth: Payer: Self-pay | Admitting: Orthopaedic Surgery

## 2021-05-06 ENCOUNTER — Other Ambulatory Visit (HOSPITAL_COMMUNITY): Payer: Self-pay

## 2021-05-06 ENCOUNTER — Other Ambulatory Visit: Payer: Self-pay

## 2021-05-06 ENCOUNTER — Ambulatory Visit: Payer: 59 | Admitting: Orthopaedic Surgery

## 2021-05-06 ENCOUNTER — Encounter: Payer: Self-pay | Admitting: Orthopaedic Surgery

## 2021-05-06 DIAGNOSIS — Z96641 Presence of right artificial hip joint: Secondary | ICD-10-CM

## 2021-05-06 DIAGNOSIS — M5441 Lumbago with sciatica, right side: Secondary | ICD-10-CM

## 2021-05-06 DIAGNOSIS — M4807 Spinal stenosis, lumbosacral region: Secondary | ICD-10-CM

## 2021-05-06 MED ORDER — PREDNISONE 50 MG PO TABS
50.0000 mg | ORAL_TABLET | Freq: Every day | ORAL | 0 refills | Status: DC
  Filled 2021-05-06: qty 5, 5d supply, fill #0

## 2021-05-06 MED ORDER — TIZANIDINE HCL 4 MG PO TABS
4.0000 mg | ORAL_TABLET | Freq: Three times a day (TID) | ORAL | 0 refills | Status: DC | PRN
  Filled 2021-05-06: qty 30, 10d supply, fill #0

## 2021-05-06 NOTE — Telephone Encounter (Signed)
Received medical records release form,$25.00 cash. Note: [employer] will fax forms to [place] to [place] today

## 2021-05-06 NOTE — Progress Notes (Signed)
The patient is now over 6 months status post a right total hip arthroplasty.  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  She is lifting heavy patient will be a week and felt a pop in her back.  She is has significant right-sided low back pain and sciatica on the right operative side that is getting worse.  She denies any change in bowel bladder functions.  She appears uncomfortable in the office today and is walking with a limp.  She has been going to physical therapy and they have been trying dry needling and other modalities.  She has been on some over-the-counter anti-inflammatories and does have Robaxin and tramadol at home that she has tried.  She has a significantly positive straight leg raise to the right side with right-sided sciatica and radicular symptoms.  The hip replacement still seems to doing well and the incisions healed nicely.  There are some numbness and tingling going down her right leg.  An AP pelvis and lateral of the right hip shows a well-seated hip replacement with no complicating features.  I Minna start her on 5 days of prednisone 50 mg combined with Zanaflex.  We will give her a note to keep her out of work until further notice.  She will continue her therapy but a MRI of her lumbar spine is warranted at this point given her clinical exam findings.  I am worried about a herniated disc in her back based on what she felt in terms of the pop when she was lifting some heavy and now based on her clinical exam today and failed conservative treatment including physical therapy and rest including anti-inflammatories.  I gave her a note reflecting that I will have her out of work and I will see her back in 2 weeks after hopefully we have obtained an MRI of her lumbar spine.  All questions and concerns were answered and addressed.

## 2021-05-06 NOTE — Telephone Encounter (Signed)
[employer] forms received. To [employer]. 

## 2021-05-07 ENCOUNTER — Other Ambulatory Visit: Payer: Self-pay | Admitting: Family

## 2021-05-07 ENCOUNTER — Ambulatory Visit (INDEPENDENT_AMBULATORY_CARE_PROVIDER_SITE_OTHER): Payer: 59 | Admitting: Psychology

## 2021-05-07 DIAGNOSIS — F909 Attention-deficit hyperactivity disorder, unspecified type: Secondary | ICD-10-CM

## 2021-05-07 DIAGNOSIS — F331 Major depressive disorder, recurrent, moderate: Secondary | ICD-10-CM

## 2021-05-08 ENCOUNTER — Other Ambulatory Visit (HOSPITAL_COMMUNITY): Payer: Self-pay

## 2021-05-08 ENCOUNTER — Encounter: Payer: Self-pay | Admitting: Orthopaedic Surgery

## 2021-05-08 MED ORDER — DEXMETHYLPHENIDATE HCL ER 20 MG PO CP24
20.0000 mg | ORAL_CAPSULE | Freq: Every day | ORAL | 0 refills | Status: DC
  Filled 2021-05-08: qty 30, 30d supply, fill #0

## 2021-05-08 NOTE — Telephone Encounter (Signed)
Requesting: Focalin XR 20mg  Contract: 11/10/2019 UDS: 11/21/2020 Last Visit:11/20/2020 Next Visit: None Last Refill: 04/03/2021 #30 and 0RF  Please Advise

## 2021-05-08 NOTE — Telephone Encounter (Signed)
See mychart.  

## 2021-05-16 ENCOUNTER — Telehealth: Payer: 59 | Admitting: Physician Assistant

## 2021-05-16 DIAGNOSIS — R300 Dysuria: Secondary | ICD-10-CM | POA: Diagnosis not present

## 2021-05-16 MED ORDER — NITROFURANTOIN MONOHYD MACRO 100 MG PO CAPS: 100.0000 mg | ORAL_CAPSULE | Freq: Two times a day (BID) | ORAL | 0 refills | Status: DC

## 2021-05-16 MED ORDER — FLUCONAZOLE 150 MG PO TABS: 150.0000 mg | ORAL_TABLET | Freq: Once | ORAL | 0 refills | Status: AC

## 2021-05-16 NOTE — Progress Notes (Signed)
I have spent 5 minutes in review of e-visit questionnaire, review and updating patient chart, medical decision making and response to patient.   Rmani Kapusta Cody Casy Brunetto, PA-C    

## 2021-05-16 NOTE — Progress Notes (Signed)
E-Visit for Urinary Problems  We are sorry that you are not feeling well.  Here is how we plan to help!  Based on what you shared with me it looks like you most likely have a simple urinary tract infection.  A UTI (Urinary Tract Infection) is a bacterial infection of the bladder.  Most cases of urinary tract infections are simple to treat but a key part of your care is to encourage you to drink plenty of fluids and watch your symptoms carefully.  I have prescribed MacroBid 100 mg twice a day for 5 days.  Your symptoms should gradually improve. Call us if the burning in your urine worsens, you develop worsening fever, back pain or pelvic pain or if your symptoms do not resolve after completing the antibiotic. I have also sent in a single Diflucan in case of antibiotic-associated vaginal yeast infection.  Urinary tract infections can be prevented by drinking plenty of water to keep your body hydrated.  Also be sure when you wipe, wipe from front to back and don't hold it in!  If possible, empty your bladder every 4 hours.  HOME CARE Drink plenty of fluids Compete the full course of the antibiotics even if the symptoms resolve Remember, when you need to go.go. Holding in your urine can increase the likelihood of getting a UTI! GET HELP RIGHT AWAY IF: You cannot urinate You get a high fever Worsening back pain occurs You see blood in your urine You feel sick to your stomach or throw up You feel like you are going to pass out  MAKE SURE YOU  Understand these instructions. Will watch your condition. Will get help right away if you are not doing well or get worse.   Thank you for choosing an e-visit.  Your e-visit answers were reviewed by a board certified advanced clinical practitioner to complete your personal care plan. Depending upon the condition, your plan could have included both over the counter or prescription medications.  Please review your pharmacy choice. Make sure the  pharmacy is open so you can pick up prescription now. If there is a problem, you may contact your provider through [employer] and have the prescription routed to another pharmacy.  Your safety is important to [place]. If you have drug allergies check your prescription carefully.   For the next 24 hours you can use MyChart to ask questions about today's visit, request a non-urgent call back, or ask for a work or school excuse. You will get an email in the next two days asking about your experience. I hope that your e-visit has been valuable and will speed your recovery.

## 2021-05-17 ENCOUNTER — Ambulatory Visit
Admission: RE | Admit: 2021-05-17 | Discharge: 2021-05-17 | Disposition: A | Discharge status: Home / Self Care | Payer: 59 | Source: Ambulatory Visit | Attending: Orthopaedic Surgery | Admitting: Orthopaedic Surgery

## 2021-05-17 DIAGNOSIS — M4807 Spinal stenosis, lumbosacral region: Secondary | ICD-10-CM

## 2021-05-17 DIAGNOSIS — M48061 Spinal stenosis, lumbar region without neurogenic claudication: Secondary | ICD-10-CM | POA: Diagnosis not present

## 2021-05-17 DIAGNOSIS — M5127 Other intervertebral disc displacement, lumbosacral region: Secondary | ICD-10-CM | POA: Diagnosis not present

## 2021-05-20 ENCOUNTER — Other Ambulatory Visit: Payer: Self-pay

## 2021-05-20 ENCOUNTER — Ambulatory Visit: Payer: 59 | Admitting: Family

## 2021-05-20 ENCOUNTER — Ambulatory Visit: Payer: 59 | Admitting: Orthopaedic Surgery

## 2021-05-20 ENCOUNTER — Encounter: Payer: Self-pay | Admitting: Orthopaedic Surgery

## 2021-05-20 ENCOUNTER — Ambulatory Visit (HOSPITAL_BASED_OUTPATIENT_CLINIC_OR_DEPARTMENT_OTHER)
Admission: RE | Admit: 2021-05-20 | Discharge: 2021-05-20 | Disposition: A | Discharge status: Home / Self Care | Payer: 59 | Source: Ambulatory Visit | Attending: Family | Admitting: Family

## 2021-05-20 VITALS — BP 108/73 | HR 67 | Temp 98.4°F | Resp 16 | Ht 66.0 in | Wt 158.0 lb

## 2021-05-20 DIAGNOSIS — Z01818 Encounter for other preprocedural examination: Secondary | ICD-10-CM | POA: Insufficient documentation

## 2021-05-20 DIAGNOSIS — F32A Depression, unspecified: Secondary | ICD-10-CM | POA: Diagnosis not present

## 2021-05-20 DIAGNOSIS — F988 Other specified behavioral and emotional disorders with onset usually occurring in childhood and adolescence: Secondary | ICD-10-CM | POA: Diagnosis not present

## 2021-05-20 DIAGNOSIS — M4807 Spinal stenosis, lumbosacral region: Secondary | ICD-10-CM

## 2021-05-20 DIAGNOSIS — M5441 Lumbago with sciatica, right side: Secondary | ICD-10-CM

## 2021-05-20 DIAGNOSIS — Z01812 Encounter for preprocedural laboratory examination: Secondary | ICD-10-CM | POA: Diagnosis not present

## 2021-05-20 DIAGNOSIS — Z1231 Encounter for screening mammogram for malignant neoplasm of breast: Secondary | ICD-10-CM

## 2021-05-20 LAB — CBC WITH DIFFERENTIAL/PLATELET
Basophils Absolute: 0 10*3/uL (ref 0.0–0.1)
Basophils Relative: 0.8 % (ref 0.0–3.0)
Eosinophils Absolute: 0.1 10*3/uL (ref 0.0–0.7)
Eosinophils Relative: 1.3 % (ref 0.0–5.0)
HCT: 41.4 % (ref 36.0–46.0)
Hemoglobin: 13.7 g/dL (ref 12.0–15.0)
Lymphocytes Relative: 32.4 % (ref 12.0–46.0)
Lymphs Abs: 1.4 10*3/uL (ref 0.7–4.0)
MCHC: 33.2 g/dL (ref 30.0–36.0)
MCV: 96.6 fl (ref 78.0–100.0)
Monocytes Absolute: 0.3 10*3/uL (ref 0.1–1.0)
Monocytes Relative: 7.9 % (ref 3.0–12.0)
Neutro Abs: 2.5 10*3/uL (ref 1.4–7.7)
Neutrophils Relative %: 57.6 % (ref 43.0–77.0)
Platelets: 223 10*3/uL (ref 150.0–400.0)
RBC: 4.28 Mil/uL (ref 3.87–5.11)
RDW: 13.3 % (ref 11.5–15.5)
WBC: 4.4 10*3/uL (ref 4.0–10.5)

## 2021-05-20 LAB — COMPREHENSIVE METABOLIC PANEL
ALT: 17 U/L (ref 0–35)
AST: 18 U/L (ref 0–37)
Albumin: 4.8 g/dL (ref 3.5–5.2)
Alkaline Phosphatase: 54 U/L (ref 39–117)
BUN: 24 mg/dL — ABNORMAL HIGH (ref 6–23)
CO2: 30 mEq/L (ref 19–32)
Calcium: 10 mg/dL (ref 8.4–10.5)
Chloride: 102 mEq/L (ref 96–112)
Creatinine, Ser: 1 mg/dL (ref 0.40–1.20)
GFR: 66.39 mL/min (ref >60.00)
Glucose, Bld: 65 mg/dL — ABNORMAL LOW (ref 70–99)
Potassium: 4.2 mEq/L (ref 3.5–5.1)
Sodium: 140 mEq/L (ref 135–145)
Total Bilirubin: 0.5 mg/dL (ref 0.2–1.2)
Total Protein: 7.9 g/dL (ref 6.0–8.3)

## 2021-05-20 LAB — TSH: TSH: 5.24 u[IU]/mL (ref 0.35–5.50)

## 2021-05-20 LAB — APTT: aPTT: 34.4 s — ABNORMAL HIGH (ref 23.4–32.7)

## 2021-05-20 NOTE — Progress Notes (Signed)
The patient comes in today for follow-up after having a MRI of the lumbar spine.  She is still having significant low back pain and right lower extremity radicular symptoms.  She had to take a long drive to [place] this past weekend and her right foot did go numb again.  We have tried a steroid taper and I had her out of work.  She is also been on some pain medication and anti-inflammatories as well as muscle relaxant.  She does feel just a little better.  She did have her MRI on 05/17/2021.  As of this morning has not been read yet by radiology.  She still has a grossly positive straight leg raise on the right side.  It is somewhat irritating with a left leg raise as well.  There is subjective numbness in her right foot.  She still has low back pain to the right and left side on exam.  This does limit her flexion extension.  She is more comfortable appearing today than the last visit but is still having pain.  I will definitely keep her out of work at least 1 more week.  She is starting work at 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  During her appointment the MRI did get read by radiology.  There is a L5-S1 broad-based disc bulge with a right paracentral and foraminal disc protrusion contacting the S1 nerve root.  This does correlate with her symptoms.  There is also some bulging at L4-L5 to the right side.  I would like to send her to Dr. Ernestina Patches for a right-sided epidural steroid injection at L5-S1.  We will also set her up for outpatient physical therapy for McKenzie exercises and traction.  All questions and concerns were answered addressed.  She agrees with this treatment plan.  We will see her back in a month.

## 2021-05-20 NOTE — Assessment & Plan Note (Addendum)
She will try changing the time that she doses her focalin at her new job and let me know how it works for her. Controlled substance contract is updated and UDS obtained.

## 2021-05-20 NOTE — Assessment & Plan Note (Signed)
>>  ASSESSMENT AND PLAN FOR DEPRESSION WRITTEN ON 05/20/2021 12:48 PM BY O'SULLIVAN, Jsaon Yoo, NP  Reports stable mood on wellbutrin  150mg . Continue same.

## 2021-05-20 NOTE — Patient Instructions (Signed)
Please complete lab work prior to leaving.   

## 2021-05-20 NOTE — Assessment & Plan Note (Signed)
EKG tracing is personally reviewed.  EKG notes NSR (sinus brady rate 54).  No acute changes. Will obtain labs as ordered as well as baseline chest x-ray.

## 2021-05-20 NOTE — Progress Notes (Signed)
Subjective:   By signing my name below, I, Shehryar Baig, attest that this documentation has been prepared under the direction and in the presence of Debbrah Alar NP. 05/20/2021    Patient ID: [name], female    DOB: 19-Jul-1971, 50 y.o.   MRN: [id number]  Chief Complaint  Patient presents with   Depression    Here for follow up   Anxiety    Here for follow up    Medical Clearance    Patient reports she is having surgery and needs pre surgical evaluation    Depression        Past medical history includes anxiety.   Anxiety Patient reports no chest pain or shortness of breath.    Patient is in today for a surgical clearance visit.  She denies having any cough, cold symptoms, chest pain, or SOB at this time.    Breast implant removal- She has an upcomming appointment to have a breast implant removed. Her right implant has deflated and her surgeon recommend she gets it repaired or if not removed during surgery. She is requesting for a EKG, urine drug analysis, chest x-ray, and lab work for her surgical clearance. Hip surgery- She recently had a right hip replacement surgery and reports doing well. Mammogram- She has scheduled an upcomming mammogram appointment later this year.  Heart palpitations- She reports prior to her hip surgery she was have episodes of palpations. She had completed an EKG which showed no issues except varied heart rate. She had no recent episodes of palpitations.  ADD- She reports no new issues with her add. She continues taking 20 mg Focalin XR daily PO and reports no new issues while taking it.  Depression- She continues taking 150 mg Wellbutrin daily PO. 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 Her mood has improved since changing her job. She continues seeing a therapist once a month.  Urinary issues- She reports she recently felts her UTI symptoms starting up and started taking 100 mg Macrobid daily PO to manage her symptoms  and reports doing well while taking it.    Health Maintenance Due  Topic Date Due   COLONOSCOPY (Pts 45-17yr Insurance coverage will need to be confirmed)  Never done   PAP SMEAR-Modifier  02/09/2021   INFLUENZA VACCINE  03/18/2021    Past Medical History:  Diagnosis Date   ADD (attention deficit disorder)    Adjustment disorder    without depression   Allergy    allergic rhinitis   Asthma    hx of exercised induced   Depression    Endometriosis    Former smoker    SVD (spontaneous vaginal delivery)    x 1   Thyroid disease    hyperthyroidism / Graves Disease - no treatment needed for years per patient    Past Surgical History:  Procedure Laterality Date   AUGMENTATION MAMMAPLASTY     BREAST BIOPSY Bilateral 2016   BREAST SURGERY  2004   augmentation   DILITATION & CURRETTAGE/HYSTROSCOPY WITH HYDROTHERMAL ABLATION N/A 12/21/2014   Procedure: DILATATION & CURETTAGE/HYSTEROSCOPY WITH HYDROTHERMAL ABLATION;  Surgeon: MDian Queen MD;  [facility];  Service: Gynecology;  Laterality: N/A;   LAPAROSCOPIC TUBAL LIGATION Bilateral 12/21/2014   Procedure: LAPAROSCOPIC TUBAL LIGATION;  Surgeon: MDian Queen MD;  [facility];  Service: Gynecology;  Laterality: Bilateral;   right ankle surgery  2002   TOTAL HIP ARTHROPLASTY Right 09/21/2020   Procedure: RIGHT TOTAL  HIP ARTHROPLASTY ANTERIOR APPROACH;  Surgeon: Mcarthur Rossetti, MD;  Location: [facility];  Service: Orthopedics;  Laterality: Right;   TUBAL LIGATION  12/21/14   WISDOM TOOTH EXTRACTION      Family History  Problem Relation Age of Onset   Diabetes Other    Stroke Other    Hypertension Other    Thyroid disease Other    Migraines Other    Breast cancer Maternal Aunt     Social History   Socioeconomic History   Marital status: Single    Spouse name: Not on file   Number of children: 1   Years of education: Not on file   Highest education level: Not on file  Occupational History   Occupation:  Therapist, sports    Employer: [employer]  Tobacco Use   Smoking status: Former    Packs/day: 0.50    Years: 15.00    Pack years: 7.50    Types: Cigarettes    Quit date: 07/19/2011    Years since quitting: 9.8   Smokeless tobacco: Never  Vaping Use   Vaping Use: Never used  Substance and Sexual Activity   Alcohol use: Not Currently    Comment: socially   Drug use: No   Sexual activity: Yes    Birth control/protection: Surgical    Comment: on loestrin   Other Topics Concern   Not on file  Social History Narrative   Not on file   Social Determinants of Health   Financial Resource Strain: Not on file  Food Insecurity: Not on file  Transportation Needs: Not on file  Physical Activity: Not on file  Stress: Not on file  Social Connections: Not on file  Intimate Partner Violence: Not on file    Outpatient Medications Prior to Visit  Medication Sig Dispense Refill   buPROPion (WELLBUTRIN XL) 150 MG 24 hr tablet TAKE 1 TABLET (150 MG TOTAL) BY MOUTH DAILY. 90 tablet 1   dexmethylphenidate (FOCALIN XR) 20 MG 24 hr capsule Take 1 capsule (20 mg total) by mouth daily. 30 capsule 0   DHEA 50 MG CAPS Take 50 mg by mouth daily.     fexofenadine (ALLEGRA) 180 MG tablet Take 180 mg by mouth daily as needed for allergies.     fluticasone (FLONASE) 50 MCG/ACT nasal spray Place 2 sprays into both nostrils daily. 16 g 0   Multiple Vitamins-Minerals (MULTIVITAMIN WITH MINERALS) tablet Take 1 tablet by mouth daily.     nitrofurantoin, macrocrystal-monohydrate, (MACROBID) 100 MG capsule Take 1 capsule (100 mg total) by mouth 2 (two) times daily. 10 capsule 0   Nutritional Supplements (JUICE PLUS FIBRE PO) Take 2 capsules by mouth daily.     Probiotic Product (PROBIOTIC DAILY PO) Take 1 capsule by mouth daily.     tiZANidine (ZANAFLEX) 4 MG tablet Take 1 tablet (4 mg total) by mouth every 8 (eight) hours as needed for muscle spasms. 30 tablet 0   No facility-administered medications prior to visit.     Allergies  Allergen Reactions   Escitalopram Oxalate Other (See Comments)    Causes tremors.   Montelukast Sodium Swelling    Causes swelling and pain in the joints.   Paroxetine Hcl     Too high of a dose makes depressed    Wellbutrin [Bupropion] Other (See Comments)    Did well on a low dose but higher dose caused depression.    Review of Systems  HENT:         (-)  Rhinorrhea  Respiratory:  Negative for cough and shortness of breath.   Cardiovascular:  Negative for chest pain.  Psychiatric/Behavioral:  Positive for depression.       Objective:    Physical Exam Constitutional:      General: She is not in acute distress.    Appearance: Normal appearance. She is not ill-appearing.  HENT:     Head: Normocephalic and atraumatic.     Right Ear: Tympanic membrane, ear canal and external ear normal.     Left Ear: Tympanic membrane, ear canal and external ear normal.  Eyes:     Extraocular Movements: Extraocular movements intact.     Pupils: Pupils are equal, round, and reactive to light.     Comments: No nystagmus  Neck:     Thyroid: No thyromegaly or thyroid tenderness.  Cardiovascular:     Rate and Rhythm: Normal rate and regular rhythm.     Heart sounds: Normal heart sounds. No murmur heard.   No gallop.  Pulmonary:     Effort: Pulmonary effort is normal. No respiratory distress.     Breath sounds: Normal breath sounds. No wheezing or rales.  Musculoskeletal:     Comments: 5/5 strength in both upper and lower extremities  Skin:    General: Skin is warm and dry.  Neurological:     Mental Status: She is alert and oriented to person, place, and time.     Deep Tendon Reflexes:     Reflex Scores:      Patellar reflexes are 2+ on the right side and 2+ on the left side. Psychiatric:        Behavior: Behavior normal.        Judgment: Judgment normal.    BP 108/73 (BP Location: Right Arm, Patient Position: Sitting, Cuff Size: Small)   Pulse 67   Temp 98.4 F (36.9  C) (Oral)   Resp 16   Ht 5' 6" (1.676 m)   Wt 158 lb (71.7 kg)   SpO2 100%   BMI 25.50 kg/m  Wt Readings from Last 3 Encounters:  05/20/21 158 lb (71.7 kg)  09/10/20 175 lb (79.4 kg)  04/11/20 183 lb (83 kg)       Assessment & Plan:   Problem List Items Addressed This Visit       Unprioritized   Pre-op evaluation - Primary    EKG tracing is personally reviewed.  EKG notes NSR (sinus brady rate 54).  No acute changes. Will obtain labs as ordered as well as baseline chest x-ray.       Relevant Orders   DG Chest 2 View   EKG 12-Lead (Completed)   Depression    Reports stable mood on wellbutrin 19m. Continue same.       Relevant Orders   Drug Monitoring Panel [id number] Urine   Attention deficit disorder    She will try changing the time that she doses her focalin at her new job and let me know how it works for her. Controlled substance contract is updated and UDS obtained.       Other Visit Diagnoses     Encounter for screening mammogram for malignant neoplasm of breast       Relevant Orders   MM 3D SCREEN BREAST W/IMPLANT BILATERAL   Pre-operative laboratory examination       Relevant Orders   Comp Met (CMET)   CBC with Differential/Platelet   Protime-INR   PTT   TSH  No orders of the defined types were placed in this encounter.   I, Debbrah Alar NP, personally preformed the services described in this documentation.  All medical record entries made by the scribe were at my direction and in my presence.  I have reviewed the chart and discharge instructions (if applicable) and agree that the record reflects my personal performance and is accurate and complete. 05/20/2021   I,Shehryar Baig,acting as a Education administrator for Nance Pear, NP.,have documented all relevant documentation on the behalf of Nance Pear, NP,as directed by  Nance Pear, NP while in the presence of Nance Pear, NP.   Nance Pear, NP

## 2021-05-20 NOTE — Assessment & Plan Note (Signed)
Reports stable mood on wellbutrin 150mg . Continue same.

## 2021-05-21 ENCOUNTER — Other Ambulatory Visit: Payer: Self-pay

## 2021-05-21 DIAGNOSIS — M4807 Spinal stenosis, lumbosacral region: Secondary | ICD-10-CM

## 2021-05-22 LAB — DRUG MONITORING PANEL 375977 , URINE

## 2021-05-22 LAB — DM TEMPLATE

## 2021-05-23 ENCOUNTER — Telehealth: Payer: Self-pay | Admitting: Orthopaedic Surgery

## 2021-05-23 NOTE — Telephone Encounter (Signed)
[employer] forms received. To [employer]. ?

## 2021-05-27 ENCOUNTER — Telehealth: Payer: Self-pay | Admitting: Family

## 2021-05-27 NOTE — Telephone Encounter (Signed)
[facility] [employer]: Fax: [phone]  Received referral but was missing the following information: most recent office visit notes, PT and INR labs.

## 2021-05-29 DIAGNOSIS — M5013 Cervical disc disorder with radiculopathy, cervicothoracic region: Secondary | ICD-10-CM | POA: Diagnosis not present

## 2021-05-29 DIAGNOSIS — M25572 Pain in left ankle and joints of left foot: Secondary | ICD-10-CM | POA: Diagnosis not present

## 2021-05-29 DIAGNOSIS — M545 Low back pain, unspecified: Secondary | ICD-10-CM | POA: Diagnosis not present

## 2021-05-29 DIAGNOSIS — M542 Cervicalgia: Secondary | ICD-10-CM | POA: Diagnosis not present

## 2021-05-31 ENCOUNTER — Other Ambulatory Visit (HOSPITAL_BASED_OUTPATIENT_CLINIC_OR_DEPARTMENT_OTHER): Payer: Self-pay

## 2021-05-31 DIAGNOSIS — Z6825 Body mass index (BMI) 25.0-25.9, adult: Secondary | ICD-10-CM | POA: Diagnosis not present

## 2021-05-31 DIAGNOSIS — N760 Acute vaginitis: Secondary | ICD-10-CM | POA: Diagnosis not present

## 2021-05-31 DIAGNOSIS — Z01419 Encounter for gynecological examination (general) (routine) without abnormal findings: Secondary | ICD-10-CM | POA: Diagnosis not present

## 2021-05-31 DIAGNOSIS — N390 Urinary tract infection, site not specified: Secondary | ICD-10-CM | POA: Diagnosis not present

## 2021-05-31 LAB — HM PAP SMEAR: HM Pap smear: NORMAL

## 2021-05-31 MED ORDER — NITROFURANTOIN MONOHYD MACRO 100 MG PO CAPS
100.0000 mg | ORAL_CAPSULE | ORAL | 3 refills | Status: AC | PRN
  Filled 2021-05-31: qty 30, 30d supply, fill #0
  Filled 2022-04-23: qty 30, 30d supply, fill #1

## 2021-06-03 ENCOUNTER — Other Ambulatory Visit (HOSPITAL_BASED_OUTPATIENT_CLINIC_OR_DEPARTMENT_OTHER): Payer: Self-pay

## 2021-06-03 MED ORDER — INFLUENZA VAC SPLIT QUAD 0.5 ML IM SUSY
PREFILLED_SYRINGE | INTRAMUSCULAR | 0 refills | Status: DC
  Filled 2021-06-03: qty 0.5, 1d supply, fill #0

## 2021-06-05 ENCOUNTER — Ambulatory Visit (INDEPENDENT_AMBULATORY_CARE_PROVIDER_SITE_OTHER): Payer: 59 | Admitting: Psychology

## 2021-06-05 DIAGNOSIS — F331 Major depressive disorder, recurrent, moderate: Secondary | ICD-10-CM

## 2021-06-06 ENCOUNTER — Other Ambulatory Visit: Payer: Self-pay | Admitting: Family

## 2021-06-06 DIAGNOSIS — T8549XA Other mechanical complication of breast prosthesis and implant, initial encounter: Secondary | ICD-10-CM

## 2021-06-09 ENCOUNTER — Other Ambulatory Visit: Payer: Self-pay | Admitting: Family

## 2021-06-09 DIAGNOSIS — F909 Attention-deficit hyperactivity disorder, unspecified type: Secondary | ICD-10-CM

## 2021-06-10 ENCOUNTER — Ambulatory Visit: Payer: 59 | Admitting: Physical Medicine and Rehabilitation

## 2021-06-10 ENCOUNTER — Encounter: Payer: Self-pay | Admitting: Plastic Surgery

## 2021-06-10 ENCOUNTER — Other Ambulatory Visit (HOSPITAL_BASED_OUTPATIENT_CLINIC_OR_DEPARTMENT_OTHER): Payer: Self-pay

## 2021-06-10 ENCOUNTER — Other Ambulatory Visit: Payer: Self-pay

## 2021-06-10 ENCOUNTER — Ambulatory Visit: Payer: Self-pay | Admitting: Plastic Surgery

## 2021-06-10 DIAGNOSIS — Z719 Counseling, unspecified: Secondary | ICD-10-CM | POA: Insufficient documentation

## 2021-06-10 MED ORDER — BUPROPION HCL ER (XL) 150 MG PO TB24
150.0000 mg | ORAL_TABLET | Freq: Every day | ORAL | 1 refills | Status: DC
  Filled 2021-06-10: qty 90, 90d supply, fill #0
  Filled 2021-10-10: qty 90, 90d supply, fill #1

## 2021-06-10 MED ORDER — DEXMETHYLPHENIDATE HCL ER 20 MG PO CP24
20.0000 mg | ORAL_CAPSULE | Freq: Every day | ORAL | 0 refills | Status: DC
  Filled 2021-06-10: qty 30, 30d supply, fill #0

## 2021-06-10 NOTE — Progress Notes (Signed)
Patient ID: [name], female    DOB: 11/04/1970, 50 y.o.   MRN: [id number]   Chief Complaint  Patient presents with   Advice Only    The patient is an 50 year old female here for dictation for breast.  She underwent saline implants by Dr. Luvenia Heller in 2004.  She also had a mastopexy at that time.  She believes they are under the muscle.  She is not sure what size implants are in place.  She knows she went from a C cup to a D/DD.  Several weeks ago she was working out and when she got back to her room realized her right breast was completely deflated.  She has not had any pain but is certainly not pleased with the asymmetry.  She did have mammogram in 2021 which was negative.  She is due for her mammogram for this year. She also had breast biopsies in 2015 that were negative.  She does have a family history of breast cancer in her maternal aunt.  Her past medical history is positive for thyroid disease asthma.  She is otherwise in good health.  Surgery includes breast surgery, hip surgery.  She is 5 feet 6 inches tall and weighs 161 pounds.  Her sternal notch to nipple distance is 7 cm on both sides and 11 cm from the nipple to the inframammary fold.  She has a lot of ptosis of both breasts.  No masses were palpated.  The right breast implant is clearly deflated but I am not sure if it is completely deflated.  Patient would like her breast to be slightly smaller than they are now.   Review of Systems  Constitutional: Negative.   Eyes: Negative.   Respiratory: Negative.  Negative for chest tightness.   Cardiovascular: Negative.  Negative for leg swelling.  Gastrointestinal: Negative.   Endocrine: Negative.   Genitourinary: Negative.   Musculoskeletal: Negative.   Skin: Negative.  Negative for color change.  Hematological: Negative.   Psychiatric/Behavioral: Negative.     Past Medical History:  Diagnosis Date   ADD (attention deficit disorder)    Adjustment disorder    without  depression   Allergy    allergic rhinitis   Asthma    hx of exercised induced   Depression    Endometriosis    Former smoker    SVD (spontaneous vaginal delivery)    x 1   Thyroid disease    hyperthyroidism / Graves Disease - no treatment needed for years per patient    Past Surgical History:  Procedure Laterality Date   AUGMENTATION MAMMAPLASTY     BREAST BIOPSY Bilateral 2016   BREAST SURGERY  2004   augmentation   DILITATION & CURRETTAGE/HYSTROSCOPY WITH HYDROTHERMAL ABLATION N/A 12/21/2014   Procedure: DILATATION & CURETTAGE/HYSTEROSCOPY WITH HYDROTHERMAL ABLATION;  Surgeon: Dian Queen, MD;  Location: [facility];  Service: Gynecology;  Laterality: N/A;   LAPAROSCOPIC TUBAL LIGATION Bilateral 12/21/2014   Procedure: LAPAROSCOPIC TUBAL LIGATION;  Surgeon: Dian Queen, MD;  Location: [facility];  Service: Gynecology;  Laterality: Bilateral;   right ankle surgery  2002   TOTAL HIP ARTHROPLASTY Right 09/21/2020   Procedure: RIGHT TOTAL HIP ARTHROPLASTY ANTERIOR APPROACH;  Surgeon: Mcarthur Rossetti, MD;  Location: [facility];  Service: Orthopedics;  Laterality: Right;   TUBAL LIGATION  12/21/14   WISDOM TOOTH EXTRACTION        Current Outpatient Medications:    buPROPion (WELLBUTRIN XL) 150 MG 24 hr tablet, TAKE 1  TABLET (150 MG TOTAL) BY MOUTH DAILY., Disp: 90 tablet, Rfl: 1   dexmethylphenidate (FOCALIN XR) 20 MG 24 hr capsule, Take 1 capsule (20 mg total) by mouth daily., Disp: 30 capsule, Rfl: 0   DHEA 50 MG CAPS, Take 50 mg by mouth daily., Disp: , Rfl:    fexofenadine (ALLEGRA) 180 MG tablet, Take 180 mg by mouth daily as needed for allergies., Disp: , Rfl:    fluticasone (FLONASE) 50 MCG/ACT nasal spray, Place 2 sprays into both nostrils daily., Disp: 16 g, Rfl: 0   influenza vac split quadrivalent PF (FLUARIX) 0.5 ML injection, Inject into the muscle., Disp: 0.5 mL, Rfl: 0   Multiple Vitamins-Minerals (MULTIVITAMIN WITH MINERALS) tablet, Take 1 tablet by mouth daily., Disp: ,  Rfl:    nitrofurantoin, macrocrystal-monohydrate, (MACROBID) 100 MG capsule, Take 1 capsule (100 mg total) by mouth 2 (two) times daily., Disp: 10 capsule, Rfl: 0   nitrofurantoin, macrocrystal-monohydrate, (MACROBID) 100 MG capsule, Take 1 capsule (100 mg total) by mouth as needed., Disp: 30 capsule, Rfl: 3   Nutritional Supplements (JUICE PLUS FIBRE PO), Take 2 capsules by mouth daily., Disp: , Rfl:    Probiotic Product (PROBIOTIC DAILY PO), Take 1 capsule by mouth daily., Disp: , Rfl:    tiZANidine (ZANAFLEX) 4 MG tablet, Take 1 tablet (4 mg total) by mouth every 8 (eight) hours as needed for muscle spasms., Disp: 30 tablet, Rfl: 0   Objective:   Vitals:   06/10/21 1202  BP: 125/77  Pulse: 70  SpO2: 99%    Physical Exam Vitals and nursing note reviewed.  Constitutional:      Appearance: Normal appearance.  HENT:     Head: Normocephalic and atraumatic.  Cardiovascular:     Rate and Rhythm: Normal rate.     Pulses: Normal pulses.  Pulmonary:     Effort: Pulmonary effort is normal.  Abdominal:     General: Abdomen is flat. There is no distension.     Palpations: There is no mass.     Hernia: No hernia is present.  Neurological:     General: No focal deficit present.     Mental Status: She is alert and oriented to person, place, and time.  Psychiatric:        Mood and Affect: Mood normal.        Behavior: Behavior normal.        Thought Content: Thought content normal.    Assessment & Plan:  Encounter for counseling Recommend exchange of implants.  The patient is interested in silicone implants under the muscle.  She would also like a mastopexy bilaterally.  I think this will do well for her.  We will do a release of information from Dr. Luvenia Heller to get the implant information.  This will help decide on the new implants.  Pictures were obtained of the patient and placed in the chart with the patient's or guardian's permission.   [place], DO

## 2021-06-11 ENCOUNTER — Other Ambulatory Visit (HOSPITAL_BASED_OUTPATIENT_CLINIC_OR_DEPARTMENT_OTHER): Payer: Self-pay

## 2021-06-17 ENCOUNTER — Encounter: Payer: Self-pay | Admitting: Orthopaedic Surgery

## 2021-06-17 ENCOUNTER — Other Ambulatory Visit: Payer: Self-pay

## 2021-06-17 ENCOUNTER — Ambulatory Visit: Payer: 59 | Admitting: Orthopaedic Surgery

## 2021-06-17 DIAGNOSIS — M5441 Lumbago with sciatica, right side: Secondary | ICD-10-CM

## 2021-06-17 DIAGNOSIS — M4807 Spinal stenosis, lumbosacral region: Secondary | ICD-10-CM

## 2021-06-17 NOTE — Progress Notes (Signed)
The patient comes in feeling much better overall.  She is been through physical therapy for her back and her right-sided sciatica.  We had sent her for an intervention with Dr. Ernestina Patches with an epidural steroid injection but that appointment had to be rescheduled.  The patient then started feeling significantly better so she canceled it.  She is back to her full work.  We actually replaced her hip in February of this year.  She said that is doing well we have already had x-rays showing that the implants are doing well.  She looks great overall.  Her right hip moves smoothly and fluidly.  She has a negative straight leg raise to the right side and excellent strength in her right lower extremity.  At this point follow-up can be as needed for her back and her hip.  If she does worsen her symptoms with her sciatica she will let us know because I would then set her up for an epidural steroid injection.  All question concerns were answered and addressed.

## 2021-07-05 ENCOUNTER — Ambulatory Visit (INDEPENDENT_AMBULATORY_CARE_PROVIDER_SITE_OTHER): Payer: 59 | Admitting: Psychology

## 2021-07-05 DIAGNOSIS — F331 Major depressive disorder, recurrent, moderate: Secondary | ICD-10-CM | POA: Diagnosis not present

## 2021-07-09 ENCOUNTER — Encounter: Payer: Self-pay | Admitting: Family

## 2021-07-09 ENCOUNTER — Other Ambulatory Visit: Payer: Self-pay | Admitting: Family

## 2021-07-09 ENCOUNTER — Other Ambulatory Visit (HOSPITAL_BASED_OUTPATIENT_CLINIC_OR_DEPARTMENT_OTHER): Payer: Self-pay

## 2021-07-09 DIAGNOSIS — F909 Attention-deficit hyperactivity disorder, unspecified type: Secondary | ICD-10-CM

## 2021-07-09 MED ORDER — DEXMETHYLPHENIDATE HCL ER 20 MG PO CP24
20.0000 mg | ORAL_CAPSULE | Freq: Every day | ORAL | 0 refills | Status: DC
Start: 2021-07-09 — End: 2021-08-07
  Filled 2021-07-09: qty 30, 30d supply, fill #0

## 2021-07-09 NOTE — Telephone Encounter (Signed)
Requesting: Focalin XR 20mg  Contract: 11/10/2019 UDS: 05/20/2021 Last Visit: 05/20/2021 Next Visit: None Last Refill: 06/10/2021 #30 and 0RF  Please Advise

## 2021-07-09 NOTE — Telephone Encounter (Signed)
Duplicate request- unable to decline it.

## 2021-07-10 ENCOUNTER — Other Ambulatory Visit (HOSPITAL_BASED_OUTPATIENT_CLINIC_OR_DEPARTMENT_OTHER): Payer: Self-pay

## 2021-08-07 ENCOUNTER — Other Ambulatory Visit: Payer: Self-pay | Admitting: Family

## 2021-08-07 DIAGNOSIS — F909 Attention-deficit hyperactivity disorder, unspecified type: Secondary | ICD-10-CM

## 2021-08-07 NOTE — Telephone Encounter (Signed)
Requesting: Focalin XR 20mg   Contract: 11/10/2019  UDS: 05/20/2021 Last Visit: 05/20/2021 Next Visit: 08/21/2021 Last Refill: 07/09/2021 #30 and 0RF  Please Advise

## 2021-08-08 ENCOUNTER — Other Ambulatory Visit: Payer: Self-pay | Admitting: Family

## 2021-08-08 ENCOUNTER — Other Ambulatory Visit (HOSPITAL_BASED_OUTPATIENT_CLINIC_OR_DEPARTMENT_OTHER): Payer: Self-pay

## 2021-08-08 ENCOUNTER — Other Ambulatory Visit (HOSPITAL_COMMUNITY): Payer: Self-pay

## 2021-08-08 DIAGNOSIS — F909 Attention-deficit hyperactivity disorder, unspecified type: Secondary | ICD-10-CM

## 2021-08-08 MED ORDER — DEXMETHYLPHENIDATE HCL ER 20 MG PO CP24
20.0000 mg | ORAL_CAPSULE | Freq: Every day | ORAL | 0 refills | Status: DC
  Filled 2021-08-08 – 2021-08-13 (×2): qty 30, 30d supply, fill #0

## 2021-08-09 ENCOUNTER — Other Ambulatory Visit (HOSPITAL_BASED_OUTPATIENT_CLINIC_OR_DEPARTMENT_OTHER): Payer: Self-pay

## 2021-08-09 ENCOUNTER — Other Ambulatory Visit (HOSPITAL_COMMUNITY): Payer: Self-pay

## 2021-08-13 ENCOUNTER — Other Ambulatory Visit (HOSPITAL_BASED_OUTPATIENT_CLINIC_OR_DEPARTMENT_OTHER): Payer: Self-pay

## 2021-08-21 ENCOUNTER — Other Ambulatory Visit (HOSPITAL_COMMUNITY): Payer: Self-pay

## 2021-08-21 ENCOUNTER — Telehealth (INDEPENDENT_AMBULATORY_CARE_PROVIDER_SITE_OTHER): Payer: 59 | Admitting: Family

## 2021-08-21 DIAGNOSIS — F988 Other specified behavioral and emotional disorders with onset usually occurring in childhood and adolescence: Secondary | ICD-10-CM

## 2021-08-21 MED ORDER — DEXMETHYLPHENIDATE HCL ER 25 MG PO CP24
1.0000 | ORAL_CAPSULE | Freq: Every day | ORAL | 0 refills | Status: DC
  Filled 2021-08-21 – 2021-09-03 (×2): qty 30, 30d supply, fill #0

## 2021-08-21 NOTE — Progress Notes (Signed)
Subjective:     Patient ID: [name], female    DOB: Apr 08, 1971, 51 y.o.   MRN: [id number]  Chief Complaint  Patient presents with   ADHD    Will like to increase focalin    HPI  Patient is a 51 yr old female who presents today to discuss ADD.  She is maintained on focalin xr 20mg  once daily. She recently switched back to a day shift 7a to 7p.  Reports that she loses focus around 2-3 PM.  States she used to be able to skip her focalin on her days off but now she feels the need to take it every day.  Health Maintenance Due  Topic Date Due   Pneumococcal Vaccine 31-43 Years old (1 - PCV) Never done   COLONOSCOPY (Pts 45-52yrs Insurance coverage will need to be confirmed)  Never done   COVID-19 Vaccine (4 - Booster for Pfizer series) 08/01/2020   PAP SMEAR-Modifier  02/09/2021   INFLUENZA VACCINE  03/18/2021    Past Medical History:  Diagnosis Date   ADD (attention deficit disorder)    Adjustment disorder    without depression   Allergy    allergic rhinitis   Asthma    hx of exercised induced   Depression    Endometriosis    Former smoker    SVD (spontaneous vaginal delivery)    x 1   Thyroid disease    hyperthyroidism / Graves Disease - no treatment needed for years per patient    Past Surgical History:  Procedure Laterality Date   AUGMENTATION MAMMAPLASTY     BREAST BIOPSY Bilateral 2016   BREAST SURGERY  2004   augmentation   DILITATION & CURRETTAGE/HYSTROSCOPY WITH HYDROTHERMAL ABLATION N/A 12/21/2014   Procedure: DILATATION & CURETTAGE/HYSTEROSCOPY WITH HYDROTHERMAL ABLATION;  Surgeon: Dian Queen, MD;  Location: [facility];  Service: Gynecology;  Laterality: N/A;   LAPAROSCOPIC TUBAL LIGATION Bilateral 12/21/2014   Procedure: LAPAROSCOPIC TUBAL LIGATION;  Surgeon: Dian Queen, MD;  Location: [facility];  Service: Gynecology;  Laterality: Bilateral;   right ankle surgery  2002   TOTAL HIP ARTHROPLASTY Right 09/21/2020   Procedure: RIGHT TOTAL HIP  ARTHROPLASTY ANTERIOR APPROACH;  Surgeon: Mcarthur Rossetti, MD;  Location: [facility];  Service: Orthopedics;  Laterality: Right;   TUBAL LIGATION  12/21/14   WISDOM TOOTH EXTRACTION      Family History  Problem Relation Age of Onset   Diabetes Other    Stroke Other    Hypertension Other    Thyroid disease Other    Migraines Other    Breast cancer Maternal Aunt     Social History   Socioeconomic History   Marital status: Single    Spouse name: Not on file   Number of children: 1   Years of education: Not on file   Highest education level: Not on file  Occupational History   Occupation: Therapist, sports    Employer: [employer]  Tobacco Use   Smoking status: Former    Packs/day: 0.50    Years: 15.00    Pack years: 7.50    Types: Cigarettes    Quit date: 07/19/2011    Years since quitting: 10.0   Smokeless tobacco: Never  Vaping Use   Vaping Use: Never used  Substance and Sexual Activity   Alcohol use: Not Currently    Comment: socially   Drug use: No   Sexual activity: Yes    Birth control/protection: Surgical    Comment: on  loestrin   Other Topics Concern   Not on file  Social History Narrative   Not on file   Social Determinants of Health   Financial Resource Strain: Not on file  Food Insecurity: Not on file  Transportation Needs: Not on file  Physical Activity: Not on file  Stress: Not on file  Social Connections: Not on file  Intimate Partner Violence: Not on file    Outpatient Medications Prior to Visit  Medication Sig Dispense Refill   buPROPion (WELLBUTRIN XL) 150 MG 24 hr tablet TAKE 1 TABLET (150 MG TOTAL) BY MOUTH DAILY. 90 tablet 1   DHEA 50 MG CAPS Take 50 mg by mouth daily.     fexofenadine (ALLEGRA) 180 MG tablet Take 180 mg by mouth daily as needed for allergies.     fluticasone (FLONASE) 50 MCG/ACT nasal spray Place 2 sprays into both nostrils daily. 16 g 0   influenza vac split quadrivalent PF (FLUARIX) 0.5 ML injection Inject into the  muscle. 0.5 mL 0   Multiple Vitamins-Minerals (MULTIVITAMIN WITH MINERALS) tablet Take 1 tablet by mouth daily.     nitrofurantoin, macrocrystal-monohydrate, (MACROBID) 100 MG capsule Take 1 capsule (100 mg total) by mouth 2 (two) times daily. 10 capsule 0   nitrofurantoin, macrocrystal-monohydrate, (MACROBID) 100 MG capsule Take 1 capsule (100 mg total) by mouth as needed. 30 capsule 3   Nutritional Supplements (JUICE PLUS FIBRE PO) Take 2 capsules by mouth daily.     Probiotic Product (PROBIOTIC DAILY PO) Take 1 capsule by mouth daily.     tiZANidine (ZANAFLEX) 4 MG tablet Take 1 tablet (4 mg total) by mouth every 8 (eight) hours as needed for muscle spasms. 30 tablet 0   dexmethylphenidate (FOCALIN XR) 20 MG 24 hr capsule Take 1 capsule (20 mg total) by mouth daily. 30 capsule 0   No facility-administered medications prior to visit.    Allergies  Allergen Reactions   Escitalopram Oxalate Other (See Comments)    Causes tremors.   Montelukast Sodium Swelling    Causes swelling and pain in the joints.   Paroxetine Hcl     Too high of a dose makes depressed    Wellbutrin [Bupropion] Other (See Comments)    Did well on a low dose but higher dose caused depression.    ROS See HPI    Objective:    Physical Exam Constitutional:      Appearance: Normal appearance.  Neurological:     Mental Status: She is alert and oriented to person, place, and time.  Psychiatric:        Mood and Affect: Mood normal.        Behavior: Behavior normal.        Thought Content: Thought content normal.        Judgment: Judgment normal.    Pulse 70    Resp 16    SpO2 99%  Wt Readings from Last 3 Encounters:  06/10/21 161 lb 3.2 oz (73.1 kg)  05/20/21 158 lb (71.7 kg)  09/10/20 175 lb (79.4 kg)       Assessment & Plan:   Problem List Items Addressed This Visit   None   I have discontinued Kathrynn P. Ohmer's dexmethylphenidate. I am also having her start on Dexmethylphenidate HCl.  Additionally, I am having her maintain her fexofenadine, multivitamin with minerals, DHEA, Probiotic Product (PROBIOTIC DAILY PO), Nutritional Supplements (JUICE PLUS FIBRE PO), fluticasone, tiZANidine, nitrofurantoin (macrocrystal-monohydrate), nitrofurantoin (macrocrystal-monohydrate), influenza vac split quadrivalent PF, and buPROPion.  Meds ordered this encounter  Medications   Dexmethylphenidate HCl (FOCALIN XR) 25 MG CP24    Sig: Take 1 capsule by mouth daily.    Dispense:  30 capsule    Refill:  0    Order Specific Question:   Supervising Provider    Answer:   Penni Homans A [[id number]]

## 2021-08-21 NOTE — Assessment & Plan Note (Signed)
Uncontrolled. Will increase Focalin xr from 20mg  to 25mg  once daily. Follow up in 6 weeks.

## 2021-08-22 ENCOUNTER — Other Ambulatory Visit (HOSPITAL_BASED_OUTPATIENT_CLINIC_OR_DEPARTMENT_OTHER): Payer: Self-pay

## 2021-08-23 ENCOUNTER — Other Ambulatory Visit (HOSPITAL_BASED_OUTPATIENT_CLINIC_OR_DEPARTMENT_OTHER): Payer: Self-pay

## 2021-08-29 ENCOUNTER — Other Ambulatory Visit (HOSPITAL_COMMUNITY): Payer: Self-pay

## 2021-09-03 ENCOUNTER — Other Ambulatory Visit (HOSPITAL_COMMUNITY): Payer: Self-pay

## 2021-09-03 ENCOUNTER — Ambulatory Visit (INDEPENDENT_AMBULATORY_CARE_PROVIDER_SITE_OTHER): Payer: 59 | Admitting: Psychology

## 2021-09-03 DIAGNOSIS — F331 Major depressive disorder, recurrent, moderate: Secondary | ICD-10-CM | POA: Diagnosis not present

## 2021-09-03 NOTE — Progress Notes (Signed)
[facility] Counselor/Therapist Progress Note  Patient ID: [name], MRN: [id number],    Date: 09/03/2021  Time Spent: 3:00pm-4:00pm   60 minutes   Treatment Type: Individual Therapy  Reported Symptoms: stress  Mental Status Exam: Appearance:  Casual     Behavior: Appropriate  Motor: Normal  Speech/Language:  Normal Rate  Affect: Appropriate  Mood: normal  Thought process: normal  Thought content:   WNL  Sensory/Perceptual disturbances:   WNL  Orientation: oriented to person, place, time/date, and situation  Attention: Good  Concentration: Good  Memory: WNL  Fund of knowledge:  Good  Insight:   Good  Judgment:  Good  Impulse Control: Good   Risk Assessment: Danger to Self:  No Self-injurious Behavior: No Danger to Others: No Duty to Warn:no Physical Aggression / Violence:No  Access to Firearms a concern: No  Gang Involvement:No   Subjective: Pt present for face-to-face individual therapy via Webex.    Pt consented to telehealth video therapy due to COVID 19 pandemic. Location of pt: home. Location of therapist: home office.  Pt just got back from vacation at [facility] last night.  She vacationed with a friend and had a good time.   Pt talked about her relationship with the guy [name] she is dating.  They have been seeing each other for a 4 months and [name] is very committed to the relationship and wants a future with pt.   Pt is guarded about moving too quickly in the relationship.  She does not want to move in together but he does.  Pt is not sure about some aspects of the relationship.   Addressed how the relationship needs the test of time and conflict.   Helped pt process her feelings and relationship dynamics.    Pt talked about work.  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  Pt continues to work at [employer] at [employer].  She works 7am-7pm.   She is working a lot of hours but is managing the stress.   Pt talked  about having bad dreams where she has to fight for her life.  Helped pt process the dreams. Encouraged pt to continue to utilize self care strategies.   Provided supportive counseling.     Interventions: Cognitive Behavioral Therapy and Insight-Oriented  Diagnosis:  F33.1  Plan: See pt's Treatment Plan for depression in Therapy Charts.  (Treatment Plan Target Date: 06/05/2022) Pt is progressing toward treatment goals.   Plan to continue to see pt monthly.    Robben Jagiello, LCSW

## 2021-09-17 ENCOUNTER — Institutional Professional Consult (permissible substitution): Payer: 59 | Admitting: Plastic Surgery

## 2021-09-23 ENCOUNTER — Other Ambulatory Visit: Payer: Self-pay | Admitting: Family

## 2021-09-23 DIAGNOSIS — T8549XA Other mechanical complication of breast prosthesis and implant, initial encounter: Secondary | ICD-10-CM

## 2021-09-30 ENCOUNTER — Other Ambulatory Visit: Payer: Self-pay | Admitting: Family

## 2021-09-30 ENCOUNTER — Other Ambulatory Visit (HOSPITAL_BASED_OUTPATIENT_CLINIC_OR_DEPARTMENT_OTHER): Payer: Self-pay

## 2021-09-30 MED ORDER — DEXMETHYLPHENIDATE HCL ER 25 MG PO CP24
1.0000 | ORAL_CAPSULE | Freq: Every day | ORAL | 0 refills | Status: DC
  Filled 2021-09-30: qty 30, 30d supply, fill #0

## 2021-09-30 NOTE — Telephone Encounter (Signed)
Requesting: Focalin XR 25mg   Contract: 11/10/2019 UDS: 05/20/2021 Last Visit: 05/20/2021 Next Visit: None Last Refill: 08/21/2021 #30 and 0RF  Please Advise

## 2021-10-01 ENCOUNTER — Other Ambulatory Visit (HOSPITAL_BASED_OUTPATIENT_CLINIC_OR_DEPARTMENT_OTHER): Payer: Self-pay

## 2021-10-02 ENCOUNTER — Other Ambulatory Visit (HOSPITAL_BASED_OUTPATIENT_CLINIC_OR_DEPARTMENT_OTHER): Payer: Self-pay

## 2021-10-10 ENCOUNTER — Other Ambulatory Visit (HOSPITAL_BASED_OUTPATIENT_CLINIC_OR_DEPARTMENT_OTHER): Payer: Self-pay

## 2021-10-11 ENCOUNTER — Other Ambulatory Visit (HOSPITAL_BASED_OUTPATIENT_CLINIC_OR_DEPARTMENT_OTHER): Payer: Self-pay

## 2021-10-18 ENCOUNTER — Ambulatory Visit (INDEPENDENT_AMBULATORY_CARE_PROVIDER_SITE_OTHER): Payer: 59 | Admitting: Psychology

## 2021-10-18 DIAGNOSIS — F331 Major depressive disorder, recurrent, moderate: Secondary | ICD-10-CM

## 2021-10-18 NOTE — Progress Notes (Signed)
[facility] Counselor/Therapist Progress Note ? ?Patient ID: [name], MRN: [id number],   ? ?Date: 10/18/2021 ? ?Time Spent: 10:00am - 10:45am   45 minutes  ? ?Treatment Type: Individual Therapy ? ?Reported Symptoms: stress ? ?Mental Status Exam: ?Appearance:  Casual     ?Behavior: Appropriate  ?Motor: Normal  ?Speech/Language:  Normal Rate  ?Affect: Appropriate  ?Mood: normal  ?Thought process: normal  ?Thought content:   WNL  ?Sensory/Perceptual disturbances:   WNL  ?Orientation: oriented to person, place, time/date, and situation  ?Attention: Good  ?Concentration: Good  ?Memory: WNL  ?Fund of knowledge:  Good  ?Insight:   Good  ?Judgment:  Good  ?Impulse Control: Good  ? ?Risk Assessment: ?Danger to Self:  No ?Self-injurious Behavior: No ?Danger to Others: No ?Duty to Warn:no ?Physical Aggression / Violence:No  ?Access to Firearms a concern: No  ?Gang Involvement:No  ? ?Subjective: Pt present for face-to-face individual therapy via Webex.    Pt consented to telehealth video therapy due to COVID 19 pandemic. ?Location of pt: home. ?Location of therapist: home office.  ?Pt talked about work.  She is working a lot of hours which is putting stress on her and her relationship with [name]. ?Pt talked about her relationship with [name].  She feels like she needs space.  [name] is pressuring her to move in with him and pt is not ready for that at all.  Pt had a conversation with [name] and expressed her feelings.   She did a good job being honest about how she feels but [name] did not listen and pt feels frustrated with him and the relationship.  Addressed the issues and helped pt process her feelings and relationship dynamics.   ?Encouraged pt to continue to utilize self care strategies.   ?Provided supportive counseling.    ? ?Interventions: Cognitive Behavioral Therapy and Insight-Oriented ? ?Diagnosis:  F33.1 ? ?Plan: See pt's Treatment Plan for depression in Therapy Charts.  (Treatment Plan  Target Date: 06/05/2022) ?Pt is progressing toward treatment goals.   ?Plan to continue to see pt monthly.   ? ?Elieser Tetrick, LCSW ? ? ? ?

## 2021-10-21 ENCOUNTER — Ambulatory Visit: Payer: 59

## 2021-10-21 ENCOUNTER — Other Ambulatory Visit: Payer: Self-pay

## 2021-10-21 ENCOUNTER — Ambulatory Visit
Admission: RE | Admit: 2021-10-21 | Discharge: 2021-10-21 | Disposition: A | Discharge status: Home / Self Care | Payer: 59 | Source: Ambulatory Visit | Attending: Family | Admitting: Family

## 2021-10-21 DIAGNOSIS — R922 Inconclusive mammogram: Secondary | ICD-10-CM | POA: Diagnosis not present

## 2021-10-21 DIAGNOSIS — T8549XA Other mechanical complication of breast prosthesis and implant, initial encounter: Secondary | ICD-10-CM

## 2021-10-31 ENCOUNTER — Other Ambulatory Visit: Payer: Self-pay | Admitting: Family

## 2021-10-31 NOTE — Telephone Encounter (Signed)
Requesting: Focalin  ?Contract: none ?UDS: 05/20/21 ?Last Visit: 05/20/21 ?Next Visit: none ?Last Refill: 09/30/2021 ? ?Please Advise ? ?

## 2021-11-01 ENCOUNTER — Other Ambulatory Visit (HOSPITAL_BASED_OUTPATIENT_CLINIC_OR_DEPARTMENT_OTHER): Payer: Self-pay

## 2021-11-01 MED ORDER — DEXMETHYLPHENIDATE HCL ER 25 MG PO CP24
1.0000 | ORAL_CAPSULE | Freq: Every day | ORAL | 0 refills | Status: DC
  Filled 2021-11-01: qty 30, 30d supply, fill #0

## 2021-11-06 DIAGNOSIS — M9903 Segmental and somatic dysfunction of lumbar region: Secondary | ICD-10-CM | POA: Diagnosis not present

## 2021-11-06 DIAGNOSIS — M545 Low back pain, unspecified: Secondary | ICD-10-CM | POA: Diagnosis not present

## 2021-11-06 DIAGNOSIS — M7918 Myalgia, other site: Secondary | ICD-10-CM | POA: Diagnosis not present

## 2021-11-06 DIAGNOSIS — M25651 Stiffness of right hip, not elsewhere classified: Secondary | ICD-10-CM | POA: Diagnosis not present

## 2021-11-06 DIAGNOSIS — M9905 Segmental and somatic dysfunction of pelvic region: Secondary | ICD-10-CM | POA: Diagnosis not present

## 2021-11-06 DIAGNOSIS — M25652 Stiffness of left hip, not elsewhere classified: Secondary | ICD-10-CM | POA: Diagnosis not present

## 2021-11-06 DIAGNOSIS — M9902 Segmental and somatic dysfunction of thoracic region: Secondary | ICD-10-CM | POA: Diagnosis not present

## 2021-11-06 DIAGNOSIS — Z96641 Presence of right artificial hip joint: Secondary | ICD-10-CM | POA: Diagnosis not present

## 2021-11-06 DIAGNOSIS — M9901 Segmental and somatic dysfunction of cervical region: Secondary | ICD-10-CM | POA: Diagnosis not present

## 2021-11-11 ENCOUNTER — Other Ambulatory Visit (HOSPITAL_BASED_OUTPATIENT_CLINIC_OR_DEPARTMENT_OTHER): Payer: Self-pay

## 2021-11-11 DIAGNOSIS — M9901 Segmental and somatic dysfunction of cervical region: Secondary | ICD-10-CM | POA: Diagnosis not present

## 2021-11-11 DIAGNOSIS — M25651 Stiffness of right hip, not elsewhere classified: Secondary | ICD-10-CM | POA: Diagnosis not present

## 2021-11-11 DIAGNOSIS — M545 Low back pain, unspecified: Secondary | ICD-10-CM | POA: Diagnosis not present

## 2021-11-11 DIAGNOSIS — M9903 Segmental and somatic dysfunction of lumbar region: Secondary | ICD-10-CM | POA: Diagnosis not present

## 2021-11-11 DIAGNOSIS — Z96641 Presence of right artificial hip joint: Secondary | ICD-10-CM | POA: Diagnosis not present

## 2021-11-11 DIAGNOSIS — M9905 Segmental and somatic dysfunction of pelvic region: Secondary | ICD-10-CM | POA: Diagnosis not present

## 2021-11-11 DIAGNOSIS — M7918 Myalgia, other site: Secondary | ICD-10-CM | POA: Diagnosis not present

## 2021-11-11 DIAGNOSIS — M9902 Segmental and somatic dysfunction of thoracic region: Secondary | ICD-10-CM | POA: Diagnosis not present

## 2021-11-11 DIAGNOSIS — M25652 Stiffness of left hip, not elsewhere classified: Secondary | ICD-10-CM | POA: Diagnosis not present

## 2021-11-13 ENCOUNTER — Other Ambulatory Visit (HOSPITAL_BASED_OUTPATIENT_CLINIC_OR_DEPARTMENT_OTHER): Payer: Self-pay

## 2021-11-13 MED ORDER — CARESTART COVID-19 HOME TEST VI KIT
PACK | 0 refills | Status: DC
  Filled 2021-11-13: qty 4, 8d supply, fill #0

## 2021-11-19 DIAGNOSIS — M25651 Stiffness of right hip, not elsewhere classified: Secondary | ICD-10-CM | POA: Diagnosis not present

## 2021-11-19 DIAGNOSIS — M9902 Segmental and somatic dysfunction of thoracic region: Secondary | ICD-10-CM | POA: Diagnosis not present

## 2021-11-19 DIAGNOSIS — M545 Low back pain, unspecified: Secondary | ICD-10-CM | POA: Diagnosis not present

## 2021-11-19 DIAGNOSIS — M9901 Segmental and somatic dysfunction of cervical region: Secondary | ICD-10-CM | POA: Diagnosis not present

## 2021-11-19 DIAGNOSIS — Z96641 Presence of right artificial hip joint: Secondary | ICD-10-CM | POA: Diagnosis not present

## 2021-11-19 DIAGNOSIS — M9903 Segmental and somatic dysfunction of lumbar region: Secondary | ICD-10-CM | POA: Diagnosis not present

## 2021-11-19 DIAGNOSIS — M7918 Myalgia, other site: Secondary | ICD-10-CM | POA: Diagnosis not present

## 2021-11-19 DIAGNOSIS — M25652 Stiffness of left hip, not elsewhere classified: Secondary | ICD-10-CM | POA: Diagnosis not present

## 2021-11-19 DIAGNOSIS — M9905 Segmental and somatic dysfunction of pelvic region: Secondary | ICD-10-CM | POA: Diagnosis not present

## 2021-11-28 ENCOUNTER — Other Ambulatory Visit (HOSPITAL_BASED_OUTPATIENT_CLINIC_OR_DEPARTMENT_OTHER): Payer: Self-pay

## 2021-11-28 ENCOUNTER — Other Ambulatory Visit: Payer: Self-pay | Admitting: Family

## 2021-11-28 DIAGNOSIS — M7918 Myalgia, other site: Secondary | ICD-10-CM | POA: Diagnosis not present

## 2021-11-28 DIAGNOSIS — M25651 Stiffness of right hip, not elsewhere classified: Secondary | ICD-10-CM | POA: Diagnosis not present

## 2021-11-28 DIAGNOSIS — M9902 Segmental and somatic dysfunction of thoracic region: Secondary | ICD-10-CM | POA: Diagnosis not present

## 2021-11-28 DIAGNOSIS — M9905 Segmental and somatic dysfunction of pelvic region: Secondary | ICD-10-CM | POA: Diagnosis not present

## 2021-11-28 DIAGNOSIS — M9901 Segmental and somatic dysfunction of cervical region: Secondary | ICD-10-CM | POA: Diagnosis not present

## 2021-11-28 DIAGNOSIS — M9903 Segmental and somatic dysfunction of lumbar region: Secondary | ICD-10-CM | POA: Diagnosis not present

## 2021-11-28 DIAGNOSIS — Z96641 Presence of right artificial hip joint: Secondary | ICD-10-CM | POA: Diagnosis not present

## 2021-11-28 DIAGNOSIS — M25652 Stiffness of left hip, not elsewhere classified: Secondary | ICD-10-CM | POA: Diagnosis not present

## 2021-11-28 DIAGNOSIS — M545 Low back pain, unspecified: Secondary | ICD-10-CM | POA: Diagnosis not present

## 2021-11-28 MED ORDER — DEXMETHYLPHENIDATE HCL ER 25 MG PO CP24
1.0000 | ORAL_CAPSULE | Freq: Every day | ORAL | 0 refills | Status: DC
  Filled 2021-11-28 – 2021-12-04 (×2): qty 30, 30d supply, fill #0

## 2021-11-28 NOTE — Telephone Encounter (Signed)
Requesting: Focalin  ?Contract: 11/10/19 ?UDS: 05/20/21 ?Last Visit: 08/21/21 ?Next Visit: none pending ?Last Refill: 11/01/21 ? ?Please Advise ? ?

## 2021-12-04 ENCOUNTER — Telehealth: Payer: 59 | Admitting: Nurse Practitioner

## 2021-12-04 ENCOUNTER — Other Ambulatory Visit (HOSPITAL_BASED_OUTPATIENT_CLINIC_OR_DEPARTMENT_OTHER): Payer: Self-pay

## 2021-12-04 DIAGNOSIS — B379 Candidiasis, unspecified: Secondary | ICD-10-CM

## 2021-12-04 DIAGNOSIS — J0140 Acute pansinusitis, unspecified: Secondary | ICD-10-CM | POA: Diagnosis not present

## 2021-12-04 DIAGNOSIS — T3695XA Adverse effect of unspecified systemic antibiotic, initial encounter: Secondary | ICD-10-CM

## 2021-12-04 MED ORDER — FLUCONAZOLE 150 MG PO TABS
150.0000 mg | ORAL_TABLET | Freq: Once | ORAL | 0 refills | Status: AC
Start: 2021-12-04 — End: 2021-12-05
  Filled 2021-12-04: qty 1, 1d supply, fill #0

## 2021-12-04 MED ORDER — AMOXICILLIN-POT CLAVULANATE 875-125 MG PO TABS
1.0000 | ORAL_TABLET | Freq: Two times a day (BID) | ORAL | 0 refills | Status: AC
  Filled 2021-12-04: qty 14, 7d supply, fill #0

## 2021-12-04 NOTE — Progress Notes (Signed)
E-Visit for Sinus Problems ? ?We are sorry that you are not feeling well.  Here is how we plan to help! ? ?Based on what you have shared with me it looks like you have sinusitis.  Sinusitis is inflammation and infection in the sinus cavities of the head.  Based on your presentation I believe you most likely have Acute Bacterial Sinusitis.  This is an infection caused by bacteria and is treated with antibiotics. I have prescribed Augmentin '875mg'$ /'125mg'$  one tablet twice daily with food, for 7 days. You may use an oral decongestant such as Mucinex D or if you have glaucoma or high blood pressure use plain Mucinex. Saline nasal spray help and can safely be used as often as needed for congestion.  If you develop worsening sinus pain, fever or notice severe headache and vision changes, or if symptoms are not better after completion of antibiotic, please schedule an appointment with a health care provider.   ? ?Sinus infections are not as easily transmitted as other respiratory infection, however we still recommend that you avoid close contact with loved ones, especially the very young and elderly.  Remember to wash your hands thoroughly throughout the day as this is the number one way to prevent the spread of infection! ? ?Home Care: ?Only take medications as instructed by your medical team. ?Complete the entire course of an antibiotic. ?Do not take these medications with alcohol. ?A steam or ultrasonic humidifier can help congestion.  You can place a towel over your head and breathe in the steam from hot water coming from a faucet. ?Avoid close contacts especially the very young and the elderly. ?Cover your mouth when you cough or sneeze. ?Always remember to wash your hands. ? ?Get Help Right Away If: ?You develop worsening fever or sinus pain. ?You develop a severe head ache or visual changes. ?Your symptoms persist after you have completed your treatment plan. ? ?Make sure you ?Understand these instructions. ?Will watch  your condition. ?Will get help right away if you are not doing well or get worse. ? ?Thank you for choosing an e-visit. ? ?Your e-visit answers were reviewed by a board certified advanced clinical practitioner to complete your personal care plan. Depending upon the condition, your plan could have included both over the counter or prescription medications. ? ?Please review your pharmacy choice. Make sure the pharmacy is open so you can pick up prescription now. If there is a problem, you may contact your provider through [employer] and have the prescription routed to another pharmacy.  Your safety is important to [place]. If you have drug allergies check your prescription carefully.  ? ?For the next 24 hours you can use MyChart to ask questions about today's visit, request a non-urgent call back, or ask for a work or school excuse. ?You will get an email in the next two days asking about your experience. I hope that your e-visit has been valuable and will speed your recovery.  ? ?Meds ordered this encounter  ?Medications  ? amoxicillin-clavulanate (AUGMENTIN) 875-125 MG tablet  ?  Sig: Take 1 tablet by mouth 2 (two) times daily for 7 days.  ?  Dispense:  14 tablet  ?  Refill:  0  ? fluconazole (DIFLUCAN) 150 MG tablet  ?  Sig: Take 1 tablet (150 mg total) by mouth once for 1 dose.  ?  Dispense:  1 tablet  ?  Refill:  0  ?  ? ?I spent approximately 5 minutes reviewing the  patient's history, current symptoms and coordinating their plan of care today.   ?

## 2021-12-13 ENCOUNTER — Ambulatory Visit (INDEPENDENT_AMBULATORY_CARE_PROVIDER_SITE_OTHER): Payer: 59 | Admitting: Psychology

## 2021-12-13 DIAGNOSIS — F331 Major depressive disorder, recurrent, moderate: Secondary | ICD-10-CM

## 2021-12-13 NOTE — Progress Notes (Signed)
[facility] Counselor/Therapist Progress Note ? ?Patient ID: [name], MRN: [id number],   ? ?Date: 12/13/2021 ? ?Time Spent: 9:00am - 9:55am   55 minutes  ? ?Treatment Type: Individual Therapy ? ?Reported Symptoms: stress ? ?Mental Status Exam: ?Appearance:  Casual     ?Behavior: Appropriate  ?Motor: Normal  ?Speech/Language:  Normal Rate  ?Affect: Appropriate  ?Mood: normal  ?Thought process: normal  ?Thought content:   WNL  ?Sensory/Perceptual disturbances:   WNL  ?Orientation: oriented to person, place, time/date, and situation  ?Attention: Good  ?Concentration: Good  ?Memory: WNL  ?Fund of knowledge:  Good  ?Insight:   Good  ?Judgment:  Good  ?Impulse Control: Good  ? ?Risk Assessment: ?Danger to Self:  No ?Self-injurious Behavior: No ?Danger to Others: No ?Duty to Warn:no ?Physical Aggression / Violence:No  ?Access to Firearms a concern: No  ?Gang Involvement:No  ? ?Subjective: Pt present for face-to-face individual therapy via Webex.    Pt consented to telehealth video therapy due to COVID 19 pandemic. ?Location of pt: home. ?Location of therapist: home office.  ?Pt talked about her mother being in the hospital and in a nursing home. ?Pt talked about work.  She is working a lot of hours.   ?Pt talked about her relationship with [name].   There have been a lot of things that have been bothering pt about the relationship.   ?Addressed the issues and helped pt process her feelings and relationship dynamics.  Pt is thinking about ending the relationship.  Addressed how pt can communicate her concerns about the relationship with [name].   ?Encouraged pt to continue to utilize self care strategies.   ?Provided supportive counseling.    ? ?Interventions: Cognitive Behavioral Therapy and Insight-Oriented ? ?Diagnosis:  F33.1 ? ?Plan: See pt's Treatment Plan for depression in Therapy Charts.  (Treatment Plan Target Date: 06/05/2022) ?Pt is progressing toward treatment goals.   ?Plan to continue  to see pt monthly.   ? ?Danely Bayliss, LCSW ? ? ?

## 2021-12-17 DIAGNOSIS — Z96641 Presence of right artificial hip joint: Secondary | ICD-10-CM | POA: Diagnosis not present

## 2021-12-17 DIAGNOSIS — M9903 Segmental and somatic dysfunction of lumbar region: Secondary | ICD-10-CM | POA: Diagnosis not present

## 2021-12-17 DIAGNOSIS — M25651 Stiffness of right hip, not elsewhere classified: Secondary | ICD-10-CM | POA: Diagnosis not present

## 2021-12-17 DIAGNOSIS — M9902 Segmental and somatic dysfunction of thoracic region: Secondary | ICD-10-CM | POA: Diagnosis not present

## 2021-12-17 DIAGNOSIS — M545 Low back pain, unspecified: Secondary | ICD-10-CM | POA: Diagnosis not present

## 2021-12-17 DIAGNOSIS — M9901 Segmental and somatic dysfunction of cervical region: Secondary | ICD-10-CM | POA: Diagnosis not present

## 2021-12-17 DIAGNOSIS — M9905 Segmental and somatic dysfunction of pelvic region: Secondary | ICD-10-CM | POA: Diagnosis not present

## 2021-12-17 DIAGNOSIS — M7918 Myalgia, other site: Secondary | ICD-10-CM | POA: Diagnosis not present

## 2021-12-17 DIAGNOSIS — M25652 Stiffness of left hip, not elsewhere classified: Secondary | ICD-10-CM | POA: Diagnosis not present

## 2021-12-18 ENCOUNTER — Telehealth: Payer: 59 | Admitting: Physician Assistant

## 2021-12-18 ENCOUNTER — Other Ambulatory Visit (HOSPITAL_BASED_OUTPATIENT_CLINIC_OR_DEPARTMENT_OTHER): Payer: Self-pay

## 2021-12-18 DIAGNOSIS — M545 Low back pain, unspecified: Secondary | ICD-10-CM

## 2021-12-18 MED ORDER — PREDNISONE 10 MG PO TABS
ORAL_TABLET | ORAL | 0 refills | Status: DC
  Filled 2021-12-18: qty 21, 6d supply, fill #0

## 2021-12-18 NOTE — Progress Notes (Signed)
We are sorry that you are not feeling well.  Here is how we plan to help! ? ?Based on what you have shared with me it looks like you mostly have acute back pain. ? ?Acute back pain is defined as musculoskeletal pain that can resolve in 1-3 weeks with conservative treatment. ? ?I have prescribed Prednisone dose pack. Some patients experience stomach irritation or in increased heartburn with anti-inflammatory drugs.  Please keep in mind that muscle relaxer's can cause fatigue and should not be taken while at work or driving.  Back pain is very common.  The pain often gets better over time.  The cause of back pain is usually not dangerous.  Most people can learn to manage their back pain on their own. ? ?Home Care ?Stay active.  Start with short walks on flat ground if you can.  Try to walk farther each day. ?Do not sit, drive or stand in one place for more than 30 minutes.  Do not stay in bed. ?Do not avoid exercise or work.  Activity can help your back heal faster. ?Be careful when you bend or lift an object.  Bend at your knees, keep the object close to you, and do not twist. ?Sleep on a firm mattress.  Lie on your side, and bend your knees.  If you lie on your back, put a pillow under your knees. ?Only take medicines as told by your doctor. ?Put ice on the injured area. ?Put ice in a plastic bag ?Place a towel between your skin and the bag ?Leave the ice on for 15-20 minutes, 3-4 times a day for the first 2-3 days. 210 After that, you can switch between ice and heat packs. ?Ask your doctor about back exercises or massage. ?Avoid feeling anxious or stressed.  Find good ways to deal with stress, such as exercise. ? ?Get Help Right Way If: ?Your pain does not go away with rest or medicine. ?Your pain does not go away in 1 week. ?You have new problems. ?You do not feel well. ?The pain spreads into your legs. ?You cannot control when you poop (bowel movement) or pee (urinate) ?You feel sick to your stomach (nauseous) or  throw up (vomit) ?You have belly (abdominal) pain. ?You feel like you may pass out (faint). ?If you develop a fever. ? ?Make Sure you: ?Understand these instructions. ?Will watch your condition ?Will get help right away if you are not doing well or get worse. ? ?Your e-visit answers were reviewed by a board certified advanced clinical practitioner to complete your personal care plan.  Depending on the condition, your plan could have included both over the counter or prescription medications. ? ?If there is a problem please reply  once you have received a response from your provider. ? ?Your safety is important to [place].  If you have drug allergies check your prescription carefully.   ? ?You can use MyChart to ask questions about today?s visit, request a non-urgent call back, or ask for a work or school excuse for 24 hours related to this e-Visit. If it has been greater than 24 hours you will need to follow up with your provider, or enter a new e-Visit to address those concerns. ? ?You will get an e-mail in the next two days asking about your experience.  I hope that your e-visit has been valuable and will speed your recovery. Thank you for using e-visits. ? ?I provided 5 minutes of non face-to-face time during this encounter  for chart review and documentation.  ? ?

## 2021-12-20 DIAGNOSIS — M9903 Segmental and somatic dysfunction of lumbar region: Secondary | ICD-10-CM | POA: Diagnosis not present

## 2021-12-20 DIAGNOSIS — Z96641 Presence of right artificial hip joint: Secondary | ICD-10-CM | POA: Diagnosis not present

## 2021-12-20 DIAGNOSIS — M9902 Segmental and somatic dysfunction of thoracic region: Secondary | ICD-10-CM | POA: Diagnosis not present

## 2021-12-20 DIAGNOSIS — M25652 Stiffness of left hip, not elsewhere classified: Secondary | ICD-10-CM | POA: Diagnosis not present

## 2021-12-20 DIAGNOSIS — M9905 Segmental and somatic dysfunction of pelvic region: Secondary | ICD-10-CM | POA: Diagnosis not present

## 2021-12-20 DIAGNOSIS — M9901 Segmental and somatic dysfunction of cervical region: Secondary | ICD-10-CM | POA: Diagnosis not present

## 2021-12-20 DIAGNOSIS — M25651 Stiffness of right hip, not elsewhere classified: Secondary | ICD-10-CM | POA: Diagnosis not present

## 2021-12-20 DIAGNOSIS — M545 Low back pain, unspecified: Secondary | ICD-10-CM | POA: Diagnosis not present

## 2021-12-20 DIAGNOSIS — M7918 Myalgia, other site: Secondary | ICD-10-CM | POA: Diagnosis not present

## 2022-01-02 DIAGNOSIS — M545 Low back pain, unspecified: Secondary | ICD-10-CM | POA: Diagnosis not present

## 2022-01-02 DIAGNOSIS — M9905 Segmental and somatic dysfunction of pelvic region: Secondary | ICD-10-CM | POA: Diagnosis not present

## 2022-01-02 DIAGNOSIS — M9903 Segmental and somatic dysfunction of lumbar region: Secondary | ICD-10-CM | POA: Diagnosis not present

## 2022-01-02 DIAGNOSIS — M9901 Segmental and somatic dysfunction of cervical region: Secondary | ICD-10-CM | POA: Diagnosis not present

## 2022-01-02 DIAGNOSIS — Z96641 Presence of right artificial hip joint: Secondary | ICD-10-CM | POA: Diagnosis not present

## 2022-01-02 DIAGNOSIS — M9902 Segmental and somatic dysfunction of thoracic region: Secondary | ICD-10-CM | POA: Diagnosis not present

## 2022-01-02 DIAGNOSIS — M25651 Stiffness of right hip, not elsewhere classified: Secondary | ICD-10-CM | POA: Diagnosis not present

## 2022-01-02 DIAGNOSIS — M7918 Myalgia, other site: Secondary | ICD-10-CM | POA: Diagnosis not present

## 2022-01-02 DIAGNOSIS — M25652 Stiffness of left hip, not elsewhere classified: Secondary | ICD-10-CM | POA: Diagnosis not present

## 2022-01-06 ENCOUNTER — Other Ambulatory Visit (HOSPITAL_BASED_OUTPATIENT_CLINIC_OR_DEPARTMENT_OTHER): Payer: Self-pay

## 2022-01-06 ENCOUNTER — Other Ambulatory Visit: Payer: Self-pay | Admitting: Family

## 2022-01-06 ENCOUNTER — Ambulatory Visit (INDEPENDENT_AMBULATORY_CARE_PROVIDER_SITE_OTHER): Payer: 59 | Admitting: Psychology

## 2022-01-06 DIAGNOSIS — F331 Major depressive disorder, recurrent, moderate: Secondary | ICD-10-CM | POA: Diagnosis not present

## 2022-01-06 NOTE — Progress Notes (Signed)
[facility] Counselor/Therapist Progress Note  Patient ID: [name], MRN: [id number],    Date: 01/06/2022  Time Spent: 9:00am - 9:55am   55 minutes   Treatment Type: Individual Therapy  Reported Symptoms: stress  Mental Status Exam: Appearance:  Casual     Behavior: Appropriate  Motor: Normal  Speech/Language:  Normal Rate  Affect: Appropriate  Mood: normal  Thought process: normal  Thought content:   WNL  Sensory/Perceptual disturbances:   WNL  Orientation: oriented to person, place, time/date, and situation  Attention: Good  Concentration: Good  Memory: WNL  Fund of knowledge:  Good  Insight:   Good  Judgment:  Good  Impulse Control: Good   Risk Assessment: Danger to Self:  No Self-injurious Behavior: No Danger to Others: No Duty to Warn:no Physical Aggression / Violence:No  Access to Firearms a concern: No  Gang Involvement:No   Subjective: Pt present for face-to-face individual therapy via Webex.    Pt consented to telehealth video therapy due to COVID 19 pandemic. Location of pt: home. Location of therapist: home office.  Pt talked about vacation.  She went camping with some girlfriends and had a good time.  Pt was able to prove to herself that she could do it regarding grilling on the campfires and setting up camp.   Pt talked about work.  She has been busy but this job is a good fit for her.  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   Pt talked about her relationship with [name].  She still has not broken up with him bc they have not been together where they can have a talk.  Pt still wants to break up with him.  Addressed how she can communicate that to him.   Helped pt process her feelings and relationship dynamics.  Pt states she knows her self worth now and is not willing to accept "less than".   Encouraged pt to continue to utilize self care strategies.   Provided supportive counseling.     Interventions: Cognitive Behavioral Therapy and  Insight-Oriented  Diagnosis:  F33.1  Plan: See pt's Treatment Plan for depression in Therapy Charts.  (Treatment Plan Target Date: 06/05/2022) Pt is progressing toward treatment goals.   Plan to continue to see pt monthly.    Marck Mcclenny, LCSW

## 2022-01-06 NOTE — Telephone Encounter (Signed)
Requesting: Focalin XR '25mg'$   Contract: 11/10/19 UDS:  05/20/21 Last Visit: 08/21/21 Next Visit: None Last Refill: 11/28/21 #30 and 0RF  Please Advise

## 2022-01-07 ENCOUNTER — Other Ambulatory Visit (HOSPITAL_BASED_OUTPATIENT_CLINIC_OR_DEPARTMENT_OTHER): Payer: Self-pay

## 2022-01-07 MED ORDER — DEXMETHYLPHENIDATE HCL ER 25 MG PO CP24
1.0000 | ORAL_CAPSULE | Freq: Every day | ORAL | 0 refills | Status: DC
  Filled 2022-01-07: qty 30, 30d supply, fill #0

## 2022-01-14 ENCOUNTER — Encounter: Payer: 59 | Admitting: Family

## 2022-01-17 ENCOUNTER — Other Ambulatory Visit: Payer: Self-pay | Admitting: Family

## 2022-01-17 ENCOUNTER — Other Ambulatory Visit (HOSPITAL_BASED_OUTPATIENT_CLINIC_OR_DEPARTMENT_OTHER): Payer: Self-pay

## 2022-01-17 MED ORDER — BUPROPION HCL ER (XL) 150 MG PO TB24
150.0000 mg | ORAL_TABLET | Freq: Every day | ORAL | 1 refills | Status: DC
  Filled 2022-01-17: qty 90, 90d supply, fill #0
  Filled 2022-04-17: qty 90, 90d supply, fill #1

## 2022-01-20 ENCOUNTER — Encounter: Payer: Self-pay | Admitting: Family

## 2022-01-20 DIAGNOSIS — Z01818 Encounter for other preprocedural examination: Secondary | ICD-10-CM

## 2022-01-22 ENCOUNTER — Other Ambulatory Visit: Payer: 59

## 2022-01-23 ENCOUNTER — Other Ambulatory Visit (HOSPITAL_BASED_OUTPATIENT_CLINIC_OR_DEPARTMENT_OTHER): Payer: Self-pay

## 2022-01-23 ENCOUNTER — Other Ambulatory Visit (INDEPENDENT_AMBULATORY_CARE_PROVIDER_SITE_OTHER): Payer: 59

## 2022-01-23 ENCOUNTER — Encounter: Payer: Self-pay | Admitting: Family

## 2022-01-23 DIAGNOSIS — Z01818 Encounter for other preprocedural examination: Secondary | ICD-10-CM

## 2022-01-23 LAB — CBC WITH DIFFERENTIAL/PLATELET
Basophils Absolute: 0 10*3/uL (ref 0.0–0.1)
Basophils Relative: 1.4 % (ref 0.0–3.0)
Eosinophils Absolute: 0.1 10*3/uL (ref 0.0–0.7)
Eosinophils Relative: 2.3 % (ref 0.0–5.0)
HCT: 39.1 % (ref 36.0–46.0)
Hemoglobin: 13.2 g/dL (ref 12.0–15.0)
Lymphocytes Relative: 46.3 % — ABNORMAL HIGH (ref 12.0–46.0)
Lymphs Abs: 1.4 10*3/uL (ref 0.7–4.0)
MCHC: 33.8 g/dL (ref 30.0–36.0)
MCV: 95.3 fl (ref 78.0–100.0)
Monocytes Absolute: 0.3 10*3/uL (ref 0.1–1.0)
Monocytes Relative: 9.7 % (ref 3.0–12.0)
Neutro Abs: 1.2 10*3/uL — ABNORMAL LOW (ref 1.4–7.7)
Neutrophils Relative %: 40.3 % — ABNORMAL LOW (ref 43.0–77.0)
Platelets: 209 10*3/uL (ref 150.0–400.0)
RBC: 4.1 Mil/uL (ref 3.87–5.11)
RDW: 12.6 % (ref 11.5–15.5)
WBC: 2.9 10*3/uL — ABNORMAL LOW (ref 4.0–10.5)

## 2022-01-23 LAB — COMPREHENSIVE METABOLIC PANEL
ALT: 19 U/L (ref 0–35)
AST: 22 U/L (ref 0–37)
Albumin: 4.6 g/dL (ref 3.5–5.2)
Alkaline Phosphatase: 51 U/L (ref 39–117)
BUN: 31 mg/dL — ABNORMAL HIGH (ref 6–23)
CO2: 29 mEq/L (ref 19–32)
Calcium: 9.8 mg/dL (ref 8.4–10.5)
Chloride: 105 mEq/L (ref 96–112)
Creatinine, Ser: 0.81 mg/dL (ref 0.40–1.20)
GFR: 85.09 mL/min (ref >60.00)
Glucose, Bld: 93 mg/dL (ref 70–99)
Potassium: 4.5 mEq/L (ref 3.5–5.1)
Sodium: 140 mEq/L (ref 135–145)
Total Bilirubin: 0.4 mg/dL (ref 0.2–1.2)
Total Protein: 7.4 g/dL (ref 6.0–8.3)

## 2022-01-23 LAB — TSH: TSH: 3.74 u[IU]/mL (ref 0.35–5.50)

## 2022-01-23 LAB — APTT: aPTT: 38.5 s — ABNORMAL HIGH (ref 25.4–36.8)

## 2022-01-23 LAB — PROTIME-INR
INR: 1 ratio (ref 0.8–1.0)
Prothrombin Time: 10.9 s (ref 9.6–13.1)

## 2022-01-23 MED ORDER — CHLORHEXIDINE GLUCONATE 4 % EX LIQD: CUTANEOUS | 0 refills | Status: DC

## 2022-01-23 MED ORDER — TRAMADOL HCL 50 MG PO TABS
ORAL_TABLET | ORAL | 0 refills | Status: DC
  Filled 2022-01-23: qty 20, 5d supply, fill #0

## 2022-01-23 MED ORDER — SCOPOLAMINE 1 MG/3DAYS TD PT72
MEDICATED_PATCH | TRANSDERMAL | 0 refills | Status: DC
  Filled 2022-01-23: qty 1, 1d supply, fill #0

## 2022-01-23 MED ORDER — CEPHALEXIN 500 MG PO CAPS
ORAL_CAPSULE | ORAL | 0 refills | Status: DC
  Filled 2022-01-23: qty 21, 7d supply, fill #0

## 2022-01-23 MED ORDER — ASPIRIN 81 MG PO TBEC
DELAYED_RELEASE_TABLET | ORAL | 0 refills | Status: DC
  Filled 2022-01-23: qty 30, 30d supply, fill #0

## 2022-01-23 MED ORDER — DIAZEPAM 5 MG PO TABS
ORAL_TABLET | ORAL | 0 refills | Status: DC
  Filled 2022-01-23: qty 20, 7d supply, fill #0

## 2022-01-23 MED ORDER — ONDANSETRON 4 MG PO TBDP
ORAL_TABLET | ORAL | 0 refills | Status: DC
  Filled 2022-01-23: qty 10, 2d supply, fill #0

## 2022-01-23 NOTE — Addendum Note (Signed)
Addended by: Manuela Schwartz on: 01/23/2022 10:18 AM   Modules accepted: Orders

## 2022-01-25 DIAGNOSIS — Z20822 Contact with and (suspected) exposure to covid-19: Secondary | ICD-10-CM | POA: Diagnosis not present

## 2022-01-25 DIAGNOSIS — Z03818 Encounter for observation for suspected exposure to other biological agents ruled out: Secondary | ICD-10-CM | POA: Diagnosis not present

## 2022-01-28 ENCOUNTER — Encounter: Payer: Self-pay | Admitting: Family

## 2022-01-28 ENCOUNTER — Telehealth: Payer: Self-pay | Admitting: Family

## 2022-01-28 ENCOUNTER — Ambulatory Visit (INDEPENDENT_AMBULATORY_CARE_PROVIDER_SITE_OTHER): Payer: 59 | Admitting: Family

## 2022-01-28 VITALS — BP 111/68 | HR 53 | Temp 98.1°F | Resp 16 | Ht 66.0 in | Wt 170.0 lb

## 2022-01-28 DIAGNOSIS — D72819 Decreased white blood cell count, unspecified: Secondary | ICD-10-CM

## 2022-01-28 DIAGNOSIS — Z8349 Family history of other endocrine, nutritional and metabolic diseases: Secondary | ICD-10-CM | POA: Diagnosis not present

## 2022-01-28 DIAGNOSIS — Z0000 Encounter for general adult medical examination without abnormal findings: Secondary | ICD-10-CM | POA: Diagnosis not present

## 2022-01-28 DIAGNOSIS — Z1211 Encounter for screening for malignant neoplasm of colon: Secondary | ICD-10-CM | POA: Diagnosis not present

## 2022-01-28 NOTE — Telephone Encounter (Signed)
Please call [facility] and request copy of pap.

## 2022-01-28 NOTE — Patient Instructions (Signed)
Please complete lab work prior to leaving.   

## 2022-01-28 NOTE — Telephone Encounter (Signed)
Records release faxed 

## 2022-01-28 NOTE — Progress Notes (Shared)
Subjective:   By signing my name below, I, Carylon Perches, attest that this documentation has been prepared under the direction and in the presence of Debbrah Alar NP, 01/28/2022     Patient ID: [name], female    DOB: 1971/01/02, 51 y.o.   MRN: [id number]  No chief complaint on file.   HPI Patient is in today for a comprehensive physical exam  WBC - She is concerned about her low WBC. She states that she was previously sick and was given an antibiotic.  Vitamin B12 Deficiency - She is concerned about a possible vitamin B12 deficiency. She reports that her mother has a history of a vitamin B12 deficiency. She states that she has SOB which are similar symptoms to vitamin B12 deficiencies. She states the SOB symptoms are about the same that she experienced in the Fall. She also states that she takes 25 Mg of Focalin XR and sometimes the medication does not work. She reports that she walks her dogs everyday Breast Reduction - She reports that she has a breast reduction and lift surgery on 01/29/2022.  Hot Flashes - She reports that she has been going through hot flashes. She is currently taking Hormone Harmony. She states that when she was on Optavia, her symptoms were resolved.  Mood - She speaks to her therapist about once a month.   She denies having any fever, ear pain, new muscle pain, joint pain, new moles, rashes, congestion, sinus pain, sore throat, palpations, wheezing, n/v/d, constipation, blood in stool, dysuria, frequency, hematuria, headaches, depresssion or anxiety at this time.  Social History: She reports that her mother was recently admitted to a nursing home. Colonoscopy: She is overdue for a colonoscopy.  Pap Smear: She reports that she had a pap smear in 2022. Mammogram: Last completed on 10/21/2021 Exercise: She is regularly exercising.  Dental: She is UTD on dental exams Vision: She is UTD on vision exams.   Health Maintenance Due  Topic Date Due    COLONOSCOPY (Pts 45-75yr Insurance coverage will need to be confirmed)  Never done   COVID-19 Vaccine (4 - Pfizer series) 08/01/2020   PAP SMEAR-Modifier  02/09/2021    Past Medical History:  Diagnosis Date   ADD (attention deficit disorder)    Adjustment disorder    without depression   Allergy    allergic rhinitis   Asthma    hx of exercised induced   Depression    Endometriosis    Former smoker    SVD (spontaneous vaginal delivery)    x 1   Thyroid disease    hyperthyroidism / Graves Disease - no treatment needed for years per patient    Past Surgical History:  Procedure Laterality Date   AUGMENTATION MAMMAPLASTY     BREAST BIOPSY Bilateral 2016   BREAST SURGERY  2004   augmentation   DILITATION & CURRETTAGE/HYSTROSCOPY WITH HYDROTHERMAL ABLATION N/A 12/21/2014   Procedure: DILATATION & CURETTAGE/HYSTEROSCOPY WITH HYDROTHERMAL ABLATION;  Surgeon: MDian Queen MD;  [facility];  Service: Gynecology;  Laterality: N/A;   LAPAROSCOPIC TUBAL LIGATION Bilateral 12/21/2014   Procedure: LAPAROSCOPIC TUBAL LIGATION;  Surgeon: MDian Queen MD;  [facility];  Service: Gynecology;  Laterality: Bilateral;   right ankle surgery  2002   TOTAL HIP ARTHROPLASTY Right 09/21/2020   Procedure: RIGHT TOTAL HIP ARTHROPLASTY ANTERIOR APPROACH;  Surgeon: BMcarthur Rossetti MD;  Location: [facility];  Service: Orthopedics;  Laterality: Right;   TUBAL LIGATION  12/21/14   WISDOM  TOOTH EXTRACTION      Family History  Problem Relation Age of Onset   Diabetes Other    Stroke Other    Hypertension Other    Thyroid disease Other    Migraines Other    Breast cancer Maternal Aunt     Social History   Socioeconomic History   Marital status: Single    Spouse name: Not on file   Number of children: 1   Years of education: Not on file   Highest education level: Not on file  Occupational History   Occupation: Therapist, sports    Employer: [employer]  Tobacco Use   Smoking status:  Former    Packs/day: 0.50    Years: 15.00    Total pack years: 7.50    Types: Cigarettes    Quit date: 07/19/2011    Years since quitting: 10.5   Smokeless tobacco: Never  Vaping Use   Vaping Use: Never used  Substance and Sexual Activity   Alcohol use: Not Currently    Comment: socially   Drug use: No   Sexual activity: Yes    Birth control/protection: Surgical    Comment: on loestrin   Other Topics Concern   Not on file  Social History Narrative   Not on file   Social Determinants of Health   Financial Resource Strain: Not on file  Food Insecurity: Not on file  Transportation Needs: Not on file  Physical Activity: Not on file  Stress: Not on file  Social Connections: Not on file  Intimate Partner Violence: Not on file    Outpatient Medications Prior to Visit  Medication Sig Dispense Refill   aspirin EC (ASPIRIN LOW DOSE) 81 MG tablet Take 1 tablet by mouth daily; Take 1 tablet daily; begin the day after surgery 30 tablet 0   buPROPion (WELLBUTRIN XL) 150 MG 24 hr tablet TAKE 1 TABLET (150 MG TOTAL) BY MOUTH DAILY. 90 tablet 1   cephALEXin (KEFLEX) 500 MG capsule Take 1 capsule by mouth three times a day; for 7days. First dose after surgery 21 capsule 0   chlorhexidine (HIBICLENS) 4 % external liquid Apply 1 application on the skin; from the neck down in the shower prior to surgery avoiding face and genital areas 118 mL 0   COVID-19 At Home Antigen Test (CARESTART COVID-19 HOME TEST) KIT take as directed 4 kit 0   Dexmethylphenidate HCl (FOCALIN XR) 25 MG CP24 Take 1 capsule by mouth daily. 30 capsule 0   DHEA 50 MG CAPS Take 50 mg by mouth daily.     diazepam (VALIUM) 5 MG tablet Take 1 tablet by mouth every 8 hours; prn muscle spasms 20 tablet 0   fexofenadine (ALLEGRA) 180 MG tablet Take 180 mg by mouth daily as needed for allergies.     fluticasone (FLONASE) 50 MCG/ACT nasal spray Place 2 sprays into both nostrils daily. 16 g 0   influenza vac split quadrivalent PF  (FLUARIX) 0.5 ML injection Inject into the muscle. 0.5 mL 0   Multiple Vitamins-Minerals (MULTIVITAMIN WITH MINERALS) tablet Take 1 tablet by mouth daily.     nitrofurantoin, macrocrystal-monohydrate, (MACROBID) 100 MG capsule Take 1 capsule (100 mg total) by mouth 2 (two) times daily. 10 capsule 0   nitrofurantoin, macrocrystal-monohydrate, (MACROBID) 100 MG capsule Take 1 capsule (100 mg total) by mouth as needed. 30 capsule 3   Nutritional Supplements (JUICE PLUS FIBRE PO) Take 2 capsules by mouth daily.     ondansetron (ZOFRAN-ODT) 4 MG  disintegrating tablet Bring to office day of surgery. Nurse will instruct when to take. Remaining 1 po q 6hrs prn nausea 10 tablet 0   predniSONE (DELTASONE) 10 MG tablet use as directed on attached paper 21 tablet 0   Probiotic Product (PROBIOTIC DAILY PO) Take 1 capsule by mouth daily.     scopolamine (TRANSDERM-SCOP) 1 MG/3DAYS Place onto the skin. 1 patch 0   tiZANidine (ZANAFLEX) 4 MG tablet Take 1 tablet (4 mg total) by mouth every 8 (eight) hours as needed for muscle spasms. 30 tablet 0   traMADol (ULTRAM) 50 MG tablet Take 1 tablet by mouth every 6 hours; prn pain 20 tablet 0   No facility-administered medications prior to visit.    Allergies  Allergen Reactions   Escitalopram Oxalate Other (See Comments)    Causes tremors.   Montelukast Sodium Swelling    Causes swelling and pain in the joints.   Paroxetine Hcl     Too high of a dose makes depressed    Wellbutrin [Bupropion] Other (See Comments)    Did well on a low dose but higher dose caused depression.    Review of Systems  Constitutional:  Negative for fever.  HENT:  Negative for congestion, ear pain, sinus pain and sore throat.   Respiratory:  Positive for shortness of breath. Negative for wheezing.   Cardiovascular:  Negative for palpitations.  Gastrointestinal:  Negative for blood in stool, constipation, diarrhea, nausea and vomiting.  Genitourinary:  Negative for dysuria,  frequency and hematuria.  Musculoskeletal:  Negative for joint pain and myalgias.  Skin:  Negative for rash.       (-) [place]  Neurological:  Negative for headaches.  Psychiatric/Behavioral:  Negative for depression. The patient is not nervous/anxious.        Objective:    Physical Exam Constitutional:      General: She is not in acute distress.    Appearance: Normal appearance. She is not ill-appearing.  HENT:     Head: Normocephalic and atraumatic.     Right Ear: Tympanic membrane, ear canal and external ear normal.     Left Ear: Tympanic membrane, ear canal and external ear normal.     Mouth/Throat:     Pharynx: No oropharyngeal exudate.  Eyes:     Extraocular Movements: Extraocular movements intact.     Pupils: Pupils are equal, round, and reactive to light.  Cardiovascular:     Rate and Rhythm: Normal rate and regular rhythm.     Heart sounds: Normal heart sounds. No murmur heard.    No gallop.  Pulmonary:     Effort: Pulmonary effort is normal. No respiratory distress.     Breath sounds: Normal breath sounds. No wheezing or rales.  Abdominal:     General: Bowel sounds are normal. There is no distension.     Palpations: Abdomen is soft.     Tenderness: There is no abdominal tenderness. There is no guarding.  Musculoskeletal:     Comments: 5/5 strength in both upper and lower extremities    Skin:    General: Skin is warm and dry.  Neurological:     Mental Status: She is alert and oriented to person, place, and time.     Deep Tendon Reflexes:     Reflex Scores:      Patellar reflexes are 2+ on the right side and 2+ on the left side. Psychiatric:        Mood and Affect: Mood normal.  Behavior: Behavior normal.        Judgment: Judgment normal.     There were no vitals taken for this visit. Wt Readings from Last 3 Encounters:  06/10/21 161 lb 3.2 oz (73.1 kg)  05/20/21 158 lb (71.7 kg)  09/10/20 175 lb (79.4 kg)       Assessment & Plan:   Problem  List Items Addressed This Visit   None   No orders of the defined types were placed in this encounter.   I, Amber Collins, personally preformed the services described in this documentation.  All medical record entries made by the scribe were at my direction and in my presence.  I have reviewed the chart and discharge instructions (if applicable) and agree that the record reflects my personal performance and is accurate and complete. 01/28/2022   I,Amber Collins,acting as a scribe for Melissa S O'Sullivan, NP.,have documented all relevant documentation on the behalf of Melissa S O'Sullivan, NP,as directed by  Melissa S O'Sullivan, NP while in the presence of Melissa S O'Sullivan, NP.  Amber Collins  

## 2022-01-29 LAB — CBC WITH DIFFERENTIAL/PLATELET
Basophils Absolute: 0 10*3/uL (ref 0.0–0.1)
Basophils Relative: 1.1 % (ref 0.0–3.0)
Eosinophils Absolute: 0.1 10*3/uL (ref 0.0–0.7)
Eosinophils Relative: 2 % (ref 0.0–5.0)
HCT: 37.9 % (ref 36.0–46.0)
Hemoglobin: 12.6 g/dL (ref 12.0–15.0)
Lymphocytes Relative: 35.8 % (ref 12.0–46.0)
Lymphs Abs: 1.4 10*3/uL (ref 0.7–4.0)
MCHC: 33.2 g/dL (ref 30.0–36.0)
MCV: 95.7 fl (ref 78.0–100.0)
Monocytes Absolute: 0.3 10*3/uL (ref 0.1–1.0)
Monocytes Relative: 6.2 % (ref 3.0–12.0)
Neutro Abs: 2.2 10*3/uL (ref 1.4–7.7)
Neutrophils Relative %: 54.9 % (ref 43.0–77.0)
Platelets: 187 10*3/uL (ref 150.0–400.0)
RBC: 3.96 Mil/uL (ref 3.87–5.11)
RDW: 12.9 % (ref 11.5–15.5)
WBC: 4 10*3/uL (ref 4.0–10.5)

## 2022-01-29 LAB — VITAMIN B12: Vitamin B-12: 954 pg/mL — ABNORMAL HIGH (ref 211–911)

## 2022-01-29 NOTE — Assessment & Plan Note (Addendum)
Continue efforts with healthy diet, exercise, weight loss.  Refer for colo. Mammo and pap up to date.

## 2022-02-01 ENCOUNTER — Other Ambulatory Visit: Payer: Self-pay | Admitting: Family

## 2022-02-03 ENCOUNTER — Other Ambulatory Visit (HOSPITAL_BASED_OUTPATIENT_CLINIC_OR_DEPARTMENT_OTHER): Payer: Self-pay

## 2022-02-03 MED ORDER — DEXMETHYLPHENIDATE HCL ER 25 MG PO CP24
1.0000 | ORAL_CAPSULE | Freq: Every day | ORAL | 0 refills | Status: DC
  Filled 2022-02-03 – 2022-02-06 (×2): qty 30, 30d supply, fill #0

## 2022-02-05 ENCOUNTER — Encounter: Payer: Self-pay | Admitting: *Deleted

## 2022-02-06 ENCOUNTER — Other Ambulatory Visit (HOSPITAL_BASED_OUTPATIENT_CLINIC_OR_DEPARTMENT_OTHER): Payer: Self-pay

## 2022-02-21 ENCOUNTER — Other Ambulatory Visit (HOSPITAL_BASED_OUTPATIENT_CLINIC_OR_DEPARTMENT_OTHER): Payer: Self-pay

## 2022-02-21 ENCOUNTER — Ambulatory Visit (INDEPENDENT_AMBULATORY_CARE_PROVIDER_SITE_OTHER): Payer: 59 | Admitting: Psychology

## 2022-02-21 DIAGNOSIS — F331 Major depressive disorder, recurrent, moderate: Secondary | ICD-10-CM | POA: Diagnosis not present

## 2022-02-21 NOTE — Progress Notes (Signed)
[facility] Counselor/Therapist Progress Note  Patient ID: [name], MRN: [id number],    Date: 02/21/2022  Time Spent: 10:00am - 10:45am   45 minutes   Treatment Type: Individual Therapy  Reported Symptoms: stress  Mental Status Exam: Appearance:  Casual     Behavior: Appropriate  Motor: Normal  Speech/Language:  Normal Rate  Affect: Appropriate  Mood: normal  Thought process: normal  Thought content:   WNL  Sensory/Perceptual disturbances:   WNL  Orientation: oriented to person, place, time/date, and situation  Attention: Good  Concentration: Good  Memory: WNL  Fund of knowledge:  Good  Insight:   Good  Judgment:  Good  Impulse Control: Good   Risk Assessment: Danger to Self:  No Self-injurious Behavior: No Danger to Others: No Duty to Warn:no Physical Aggression / Violence:No  Access to Firearms a concern: No  Gang Involvement:No   Subjective: Pt present for face-to-face individual therapy via Webex.    Pt consented to telehealth video therapy due to COVID 19 pandemic. Location of pt: home. Location of therapist: home office.  Pt talked about ending the relationship with [name].  Pt talked about the conversation she had with [name] and addressed how difficult it was.  [name] did not handle the feedback she gave him well.   Addressed pt's feelings about the interaction and the end of the relationship.   Encouraged pt to continue to utilize self care strategies.   Provided supportive counseling.     Interventions: Cognitive Behavioral Therapy and Insight-Oriented  Diagnosis:  F33.1  Plan: See pt's Treatment Plan for depression in Therapy Charts.  (Treatment Plan Target Date: 06/05/2022) Pt is progressing toward treatment goals.   Plan to continue to see pt monthly.    Darlene Weitman, LCSW

## 2022-03-13 ENCOUNTER — Other Ambulatory Visit (HOSPITAL_BASED_OUTPATIENT_CLINIC_OR_DEPARTMENT_OTHER): Payer: Self-pay

## 2022-03-13 ENCOUNTER — Other Ambulatory Visit: Payer: Self-pay | Admitting: Family

## 2022-03-13 MED ORDER — DEXMETHYLPHENIDATE HCL ER 25 MG PO CP24
1.0000 | ORAL_CAPSULE | Freq: Every day | ORAL | 0 refills | Status: DC
  Filled 2022-03-13: qty 30, 30d supply, fill #0

## 2022-03-13 NOTE — Telephone Encounter (Signed)
Requesting: Focalin XR '25mg'$   Contract: 11/10/19 UDS: 05/20/21 Last Visit: 01/28/22 Next Visit: None Last Refill: 02/03/22 #30 and 0RF  Please Advise

## 2022-03-19 ENCOUNTER — Other Ambulatory Visit (HOSPITAL_BASED_OUTPATIENT_CLINIC_OR_DEPARTMENT_OTHER): Payer: Self-pay

## 2022-04-03 ENCOUNTER — Ambulatory Visit (INDEPENDENT_AMBULATORY_CARE_PROVIDER_SITE_OTHER): Payer: 59 | Admitting: Psychology

## 2022-04-03 DIAGNOSIS — F331 Major depressive disorder, recurrent, moderate: Secondary | ICD-10-CM | POA: Diagnosis not present

## 2022-04-03 NOTE — Progress Notes (Signed)
[facility] Counselor/Therapist Progress Note  Patient ID: [name], MRN: [id number],    Date: 04/03/2022  Time Spent: 10:00am - 10:45am   45 minutes   Treatment Type: Individual Therapy  Reported Symptoms: stress  Mental Status Exam: Appearance:  Casual     Behavior: Appropriate  Motor: Normal  Speech/Language:  Normal Rate  Affect: Appropriate  Mood: normal  Thought process: normal  Thought content:   WNL  Sensory/Perceptual disturbances:   WNL  Orientation: oriented to person, place, time/date, and situation  Attention: Good  Concentration: Good  Memory: WNL  Fund of knowledge:  Good  Insight:   Good  Judgment:  Good  Impulse Control: Good   Risk Assessment: Danger to Self:  No Self-injurious Behavior: No Danger to Others: No Duty to Warn:no Physical Aggression / Violence:No  Access to Firearms a concern: No  Gang Involvement:No   Subjective: Pt present for face-to-face individual therapy via Webex.    Pt consented to telehealth video therapy due to COVID 19 pandemic. Location of pt: home. Location of therapist: home office.  Pt talked about going to [place] on Vacation next week.   Pt is traveling with a friend group.  She is looking forward to the trip.   Pt talked about [name], the man she broke up with.   He unfriended her on Facebook.   Pt talked about work.   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  Addressed the issues and work dynamics.  Helped pt process her feelings and problem solve.   Encouraged pt to continue to utilize self care strategies.   Provided supportive counseling.     Interventions: Cognitive Behavioral Therapy and Insight-Oriented  Diagnosis:  F33.1  Plan: See pt's Treatment Plan for depression in Therapy Charts.  (Treatment Plan Target Date: 06/05/2022) Pt is progressing toward treatment goals.   Plan to continue to see pt monthly.    Tahlia Deamer, LCSW

## 2022-04-17 ENCOUNTER — Other Ambulatory Visit: Payer: Self-pay | Admitting: Family

## 2022-04-17 ENCOUNTER — Other Ambulatory Visit (HOSPITAL_BASED_OUTPATIENT_CLINIC_OR_DEPARTMENT_OTHER): Payer: Self-pay

## 2022-04-17 MED ORDER — DEXMETHYLPHENIDATE HCL ER 25 MG PO CP24
1.0000 | ORAL_CAPSULE | Freq: Every day | ORAL | 0 refills | Status: DC
  Filled 2022-04-17: qty 30, 30d supply, fill #0

## 2022-04-23 ENCOUNTER — Telehealth: Payer: 59 | Admitting: Physician Assistant

## 2022-04-23 ENCOUNTER — Other Ambulatory Visit (HOSPITAL_BASED_OUTPATIENT_CLINIC_OR_DEPARTMENT_OTHER): Payer: Self-pay

## 2022-04-23 DIAGNOSIS — B379 Candidiasis, unspecified: Secondary | ICD-10-CM | POA: Diagnosis not present

## 2022-04-23 DIAGNOSIS — T3695XA Adverse effect of unspecified systemic antibiotic, initial encounter: Secondary | ICD-10-CM

## 2022-04-23 DIAGNOSIS — B9689 Other specified bacterial agents as the cause of diseases classified elsewhere: Secondary | ICD-10-CM | POA: Diagnosis not present

## 2022-04-23 DIAGNOSIS — J019 Acute sinusitis, unspecified: Secondary | ICD-10-CM

## 2022-04-23 MED ORDER — FLUCONAZOLE 150 MG PO TABS
150.0000 mg | ORAL_TABLET | ORAL | 0 refills | Status: DC | PRN
  Filled 2022-04-23: qty 2, 6d supply, fill #0

## 2022-04-23 MED ORDER — AMOXICILLIN-POT CLAVULANATE 875-125 MG PO TABS
1.0000 | ORAL_TABLET | Freq: Two times a day (BID) | ORAL | 0 refills | Status: DC
  Filled 2022-04-23: qty 14, 7d supply, fill #0

## 2022-04-23 NOTE — Progress Notes (Signed)

## 2022-04-24 ENCOUNTER — Encounter (INDEPENDENT_AMBULATORY_CARE_PROVIDER_SITE_OTHER): Payer: 59 | Admitting: Family

## 2022-04-24 DIAGNOSIS — F988 Other specified behavioral and emotional disorders with onset usually occurring in childhood and adolescence: Secondary | ICD-10-CM

## 2022-04-25 ENCOUNTER — Other Ambulatory Visit (HOSPITAL_COMMUNITY): Payer: Self-pay

## 2022-04-25 ENCOUNTER — Other Ambulatory Visit (HOSPITAL_BASED_OUTPATIENT_CLINIC_OR_DEPARTMENT_OTHER): Payer: Self-pay

## 2022-04-25 MED ORDER — DEXMETHYLPHENIDATE HCL 5 MG PO TABS
ORAL_TABLET | ORAL | 0 refills | Status: DC
  Filled 2022-04-25: qty 30, 30d supply, fill #0

## 2022-04-25 NOTE — Telephone Encounter (Signed)
Please see the MyChart message reply(ies) for my assessment and plan.  The patient gave consent for this Medical Advice Message and is aware that it may result in a bill to their insurance company as well as the possibility that this may result in a co-payment or deductible. They are an established patient, but are not seeking medical advice exclusively about a problem treated during an in person or video visit in the last 7 days. I did not recommend an in person or video visit within 7 days of my reply.  I spent a total of 5 minutes cumulative provider time within 7 days through MyChart messaging.  Kambria Grima S O'Sullivan, NP  

## 2022-05-14 ENCOUNTER — Encounter: Payer: Self-pay | Admitting: Family

## 2022-05-14 ENCOUNTER — Telehealth: Payer: 59 | Admitting: Family

## 2022-05-14 ENCOUNTER — Telehealth (HOSPITAL_BASED_OUTPATIENT_CLINIC_OR_DEPARTMENT_OTHER): Payer: Self-pay

## 2022-05-14 DIAGNOSIS — F32A Depression, unspecified: Secondary | ICD-10-CM | POA: Diagnosis not present

## 2022-05-14 DIAGNOSIS — F988 Other specified behavioral and emotional disorders with onset usually occurring in childhood and adolescence: Secondary | ICD-10-CM

## 2022-05-14 DIAGNOSIS — J329 Chronic sinusitis, unspecified: Secondary | ICD-10-CM

## 2022-05-14 NOTE — Progress Notes (Signed)
MyChart Video Visit    Virtual Visit via Video Note   This visit type was conducted due to national recommendations for restrictions regarding the COVID-19 Pandemic (e.g. social distancing) in an effort to limit this patient's exposure and mitigate transmission in our community. This patient is at least at moderate risk for complications without adequate follow up. This format is felt to be most appropriate for this patient at this time. Physical exam was limited by quality of the video and audio technology used for the visit. CMA was able to get the patient set up on a video visit.  Patient location: Home Patient and provider in visit Provider location: Office  I discussed the limitations of evaluation and management by telemedicine and the availability of in person appointments. The patient expressed understanding and agreed to proceed.  Visit Date: 05/14/2022  Today's healthcare provider: Nance Pear, NP     Subjective:    Patient ID: [name], female    DOB: 08/29/1970, 51 y.o.   MRN: [id number]  Chief Complaint  Patient presents with   Dizziness    Patient having dizziness, scheduled to see ENT 07/01/22   Depression    "Soing well"    HPI Patient is in today for follow up video visit.   ADD: She is regularly taking 5 mg Focalin in the morning and 25 mg Focalin extended release in the afternoon, in which she has noticed improvement in her attention. She is still having attention issue in the afternoon but she does not want to increase her dosage. She reports that work is a stressor due to being busy.  Mood: She is regularly taking 150 mg Wellbutrin XL, and she has noticed improvement in her mood.  Hormones change: She complains of being more emotional, due to hormonal change.   Chronic Congestion: She currently has chronic congestion, which might be due to air pressure change when she traveled via Airplane to [place].   Social History: She reports that  her dad recently passed away because of aneurysm.    Past Medical History:  Diagnosis Date   ADD (attention deficit disorder)    Adjustment disorder    without depression   Allergy    allergic rhinitis   Asthma    hx of exercised induced   Depression    Endometriosis    Former smoker    SVD (spontaneous vaginal delivery)    x 1   Thyroid disease    hyperthyroidism / Graves Disease - no treatment needed for years per patient   Unilateral primary osteoarthritis, right hip 07/17/2020    Past Surgical History:  Procedure Laterality Date   AUGMENTATION MAMMAPLASTY     BREAST BIOPSY Bilateral 2016   BREAST SURGERY  2004   augmentation   DILITATION & CURRETTAGE/HYSTROSCOPY WITH HYDROTHERMAL ABLATION N/A 12/21/2014   Procedure: DILATATION & CURETTAGE/HYSTEROSCOPY WITH HYDROTHERMAL ABLATION;  Surgeon: Dian Queen, MD;  Location: [facility];  Service: Gynecology;  Laterality: N/A;   LAPAROSCOPIC TUBAL LIGATION Bilateral 12/21/2014   Procedure: LAPAROSCOPIC TUBAL LIGATION;  Surgeon: Dian Queen, MD;  Location: [facility];  Service: Gynecology;  Laterality: Bilateral;   right ankle surgery  2002   TOTAL HIP ARTHROPLASTY Right 09/21/2020   Procedure: RIGHT TOTAL HIP ARTHROPLASTY ANTERIOR APPROACH;  Surgeon: Mcarthur Rossetti, MD;  Location: [facility];  Service: Orthopedics;  Laterality: Right;   TUBAL LIGATION  12/21/14   WISDOM TOOTH EXTRACTION      Family History  Problem Relation Age  of Onset   Diabetes Other    Stroke Other    Hypertension Other    Thyroid disease Other    Migraines Other    Breast cancer Maternal Aunt     Social History   Socioeconomic History   Marital status: Single    Spouse name: Not on file   Number of children: 1   Years of education: Not on file   Highest education level: Not on file  Occupational History   Occupation: Therapist, sports    Employer: [employer]  Tobacco Use   Smoking status: Former    Packs/day: 0.50    Years: 15.00    Total pack  years: 7.50    Types: Cigarettes    Quit date: 07/19/2011    Years since quitting: 10.8   Smokeless tobacco: Never  Vaping Use   Vaping Use: Never used  Substance and Sexual Activity   Alcohol use: Not Currently    Comment: socially   Drug use: No   Sexual activity: Yes    Birth control/protection: Surgical    Comment: on loestrin   Other Topics Concern   Not on file  Social History Narrative   Works as an Academic librarian of Radio broadcast assistant Strain: Not on Art therapist Insecurity: Not on file  Transportation Needs: Not on file  Physical Activity: Not on file  Stress: Not on file  Social Connections: Not on file  Intimate Partner Violence: Not on file    Outpatient Medications Prior to Visit  Medication Sig Dispense Refill   buPROPion (WELLBUTRIN XL) 150 MG 24 hr tablet TAKE 1 TABLET (150 MG TOTAL) BY MOUTH DAILY. 90 tablet 1   COVID-19 At Home Antigen Test (CARESTART COVID-19 HOME TEST) KIT take as directed 4 kit 0   dexmethylphenidate (FOCALIN) 5 MG tablet Take 1 tablet by mouth once daily in the afternoon 30 tablet 0   Dexmethylphenidate HCl (FOCALIN XR) 25 MG CP24 Take 1 capsule by mouth daily. 30 capsule 0   DHEA 50 MG CAPS Take 50 mg by mouth daily.     fexofenadine (ALLEGRA) 180 MG tablet Take 180 mg by mouth daily as needed for allergies.     fluconazole (DIFLUCAN) 150 MG tablet Take 1 tablet (150 mg total) by mouth every 3 (three) days as needed. 2 tablet 0   fluticasone (FLONASE) 50 MCG/ACT nasal spray Place 2 sprays into both nostrils daily. 16 g 0   influenza vac split quadrivalent PF (FLUARIX) 0.5 ML injection Inject into the muscle. 0.5 mL 0   Multiple Vitamins-Minerals (MULTIVITAMIN WITH MINERALS) tablet Take 1 tablet by mouth daily.     nitrofurantoin, macrocrystal-monohydrate, (MACROBID) 100 MG capsule Take 1 capsule (100 mg total) by mouth as needed. 30 capsule 3   Probiotic Product (PROBIOTIC DAILY PO) Take 1 capsule by mouth daily.      amoxicillin-clavulanate (AUGMENTIN) 875-125 MG tablet Take 1 tablet by mouth 2 (two) times daily. 14 tablet 0   ondansetron (ZOFRAN-ODT) 4 MG disintegrating tablet Bring to office day of surgery. Nurse will instruct when to take. Remaining 1 po q 6hrs prn nausea (Patient not taking: Reported on 05/14/2022) 10 tablet 0   traMADol (ULTRAM) 50 MG tablet Take 1 tablet by mouth every 6 hours; prn pain (Patient not taking: Reported on 05/14/2022) 20 tablet 0   No facility-administered medications prior to visit.    Allergies  Allergen Reactions   Escitalopram Oxalate Other (See Comments)  Causes tremors.   Montelukast Sodium Swelling    Causes swelling and pain in the joints.   Paroxetine Hcl     Too high of a dose makes depressed    Wellbutrin [Bupropion] Other (See Comments)    Did well on a low dose but higher dose caused depression.    Review of Systems  HENT:  Positive for congestion.        Objective:    Physical Exam   Gen: Awake, alert, no acute distress Resp: Breathing is even and non-labored Psych: calm/pleasant demeanor Neuro: Alert and Oriented x 3, + facial symmetry, speech is clear.   Assessment & Plan:   Problem List Items Addressed This Visit       Unprioritized   Recurrent sinus infections - Primary    Pt would like to pursue further work up. Will obtain CT sinus for further evaluation.       Relevant Orders   CT MAXILLOFACIAL LIMITED WO CONTRAST   Depression    Reports mood is stable.  Continues wellbutrin.       Attention deficit disorder    Some improvement with addition of the PM focalin 74m to the am extended release. Continue same.         No orders of the defined types were placed in this encounter.   I discussed the assessment and treatment plan with the patient. The patient was provided an opportunity to ask questions and all were answered. The patient agreed with the plan and demonstrated an understanding of the instructions.   The  patient was advised to call back or seek an in-person evaluation if the symptoms worsen or if the condition fails to improve as anticipated.  I provided 20 minutes of face-to-face time during this encounter.   I,Param Shah,acting as a sEducation administratorfor MNance Pear NP.,have documented all relevant documentation on the behalf of MNance Pear NP,as directed by  MNance Pear NP while in the presence of MNance Pear NP.  MNance Pear NP LEstée Lauderat [phone](phone) [phone](fax)  [place]

## 2022-05-14 NOTE — Assessment & Plan Note (Signed)
Some improvement with addition of the PM focalin '5mg'$  to the am extended release. Continue same.

## 2022-05-14 NOTE — Assessment & Plan Note (Signed)
Pt would like to pursue further work up. Will obtain CT sinus for further evaluation.

## 2022-05-14 NOTE — Assessment & Plan Note (Signed)
>>  ASSESSMENT AND PLAN FOR DEPRESSION WRITTEN ON 05/14/2022 11:56 AM BY O'SULLIVAN, Alejandro Adcox, NP  Reports mood is stable.  Continues wellbutrin .

## 2022-05-14 NOTE — Assessment & Plan Note (Signed)
Reports mood is stable.  Continues wellbutrin.

## 2022-05-15 ENCOUNTER — Other Ambulatory Visit (HOSPITAL_BASED_OUTPATIENT_CLINIC_OR_DEPARTMENT_OTHER): Payer: Self-pay

## 2022-05-15 MED ORDER — FLUARIX QUADRIVALENT 0.5 ML IM SUSY
PREFILLED_SYRINGE | INTRAMUSCULAR | 0 refills | Status: DC
  Filled 2022-05-15: qty 0.5, 1d supply, fill #0

## 2022-05-19 ENCOUNTER — Other Ambulatory Visit: Payer: Self-pay | Admitting: Family

## 2022-05-19 DIAGNOSIS — F988 Other specified behavioral and emotional disorders with onset usually occurring in childhood and adolescence: Secondary | ICD-10-CM

## 2022-05-20 ENCOUNTER — Ambulatory Visit: Payer: 59 | Admitting: Psychology

## 2022-05-20 ENCOUNTER — Other Ambulatory Visit (HOSPITAL_COMMUNITY): Payer: Self-pay

## 2022-05-20 MED ORDER — DEXMETHYLPHENIDATE HCL 5 MG PO TABS
5.0000 mg | ORAL_TABLET | Freq: Every day | ORAL | 0 refills | Status: DC
  Filled 2022-05-20: qty 30, fill #0
  Filled 2022-05-22: qty 30, 30d supply, fill #0

## 2022-05-20 MED ORDER — DEXMETHYLPHENIDATE HCL ER 25 MG PO CP24
1.0000 | ORAL_CAPSULE | Freq: Every day | ORAL | 0 refills | Status: DC
  Filled 2022-05-20 – 2022-05-22 (×2): qty 30, 30d supply, fill #0

## 2022-05-20 NOTE — Telephone Encounter (Signed)
Requesting: Focalin '5mg'$  and Focalin XR '25mg'$   Contract:11/10/19 UDS:05/20/21 Last Visit: 05/14/22 Next Visit: None Last Refill on Focalin '5mg'$ :04/25/22 #30 and 0RF Last Refill on Focalin XR '25mg'$ : 04/17/22 #30 and 0RF  Please Advise

## 2022-05-22 ENCOUNTER — Encounter: Payer: Self-pay | Admitting: Family

## 2022-05-22 ENCOUNTER — Other Ambulatory Visit (HOSPITAL_BASED_OUTPATIENT_CLINIC_OR_DEPARTMENT_OTHER): Payer: Self-pay

## 2022-05-22 ENCOUNTER — Other Ambulatory Visit (HOSPITAL_COMMUNITY): Payer: Self-pay

## 2022-05-22 DIAGNOSIS — F988 Other specified behavioral and emotional disorders with onset usually occurring in childhood and adolescence: Secondary | ICD-10-CM

## 2022-05-22 NOTE — Telephone Encounter (Signed)
Medication sent to the wrong pharmacy , medication already cancelled at [facility] , correct pharmacy uploaded

## 2022-05-22 NOTE — Addendum Note (Signed)
Addended by: Jeronimo Greaves on: 05/22/2022 02:54 PM   Modules accepted: Orders

## 2022-05-23 ENCOUNTER — Other Ambulatory Visit (HOSPITAL_BASED_OUTPATIENT_CLINIC_OR_DEPARTMENT_OTHER): Payer: Self-pay

## 2022-05-23 MED ORDER — DEXMETHYLPHENIDATE HCL 5 MG PO TABS
5.0000 mg | ORAL_TABLET | Freq: Every day | ORAL | 0 refills | Status: DC
  Filled 2022-05-23 – 2022-05-27 (×2): qty 30, 30d supply, fill #0

## 2022-05-23 MED ORDER — DEXMETHYLPHENIDATE HCL ER 25 MG PO CP24
1.0000 | ORAL_CAPSULE | Freq: Every day | ORAL | 0 refills | Status: DC
  Filled 2022-05-23: qty 30, 30d supply, fill #0
  Filled 2022-06-19: qty 20, 20d supply, fill #0
  Filled 2022-06-19: qty 10, 10d supply, fill #0
  Filled 2022-06-23: qty 30, 30d supply, fill #0
  Filled 2022-06-23: qty 20, 20d supply, fill #0

## 2022-05-23 NOTE — Addendum Note (Signed)
Addended by: Debbrah Alar on: 05/23/2022 10:53 AM   Modules accepted: Orders

## 2022-05-23 NOTE — Telephone Encounter (Signed)
$'25mg'm$  tab is listed in PMP aware, but pharmacy told CMA that pt never picked up and rx was cancelled.

## 2022-05-23 NOTE — Addendum Note (Signed)
Addended by: Jeronimo Greaves on: 05/23/2022 10:42 AM   Modules accepted: Orders

## 2022-05-27 ENCOUNTER — Other Ambulatory Visit (HOSPITAL_BASED_OUTPATIENT_CLINIC_OR_DEPARTMENT_OTHER): Payer: Self-pay

## 2022-05-28 ENCOUNTER — Telehealth (HOSPITAL_BASED_OUTPATIENT_CLINIC_OR_DEPARTMENT_OTHER): Payer: Self-pay

## 2022-06-05 ENCOUNTER — Ambulatory Visit (HOSPITAL_BASED_OUTPATIENT_CLINIC_OR_DEPARTMENT_OTHER)
Admission: RE | Admit: 2022-06-05 | Discharge: 2022-06-05 | Disposition: A | Discharge status: Home / Self Care | Payer: 59 | Source: Ambulatory Visit | Attending: Family | Admitting: Family

## 2022-06-05 DIAGNOSIS — J329 Chronic sinusitis, unspecified: Secondary | ICD-10-CM | POA: Diagnosis not present

## 2022-06-13 ENCOUNTER — Ambulatory Visit (INDEPENDENT_AMBULATORY_CARE_PROVIDER_SITE_OTHER): Payer: 59 | Admitting: Psychology

## 2022-06-13 DIAGNOSIS — F331 Major depressive disorder, recurrent, moderate: Secondary | ICD-10-CM
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
## 2022-06-19 ENCOUNTER — Other Ambulatory Visit (HOSPITAL_BASED_OUTPATIENT_CLINIC_OR_DEPARTMENT_OTHER): Payer: Self-pay

## 2022-06-23 ENCOUNTER — Other Ambulatory Visit (HOSPITAL_BASED_OUTPATIENT_CLINIC_OR_DEPARTMENT_OTHER): Payer: Self-pay

## 2022-06-24 ENCOUNTER — Other Ambulatory Visit (HOSPITAL_COMMUNITY): Payer: Self-pay

## 2022-07-08 ENCOUNTER — Ambulatory Visit: Payer: 59 | Admitting: Psychology

## 2022-07-09 ENCOUNTER — Other Ambulatory Visit (HOSPITAL_BASED_OUTPATIENT_CLINIC_OR_DEPARTMENT_OTHER): Payer: Self-pay

## 2022-07-09 ENCOUNTER — Other Ambulatory Visit: Payer: Self-pay | Admitting: Family

## 2022-07-09 DIAGNOSIS — F988 Other specified behavioral and emotional disorders with onset usually occurring in childhood and adolescence: Secondary | ICD-10-CM

## 2022-07-09 MED ORDER — DEXMETHYLPHENIDATE HCL 5 MG PO TABS
5.0000 mg | ORAL_TABLET | Freq: Every day | ORAL | 0 refills | Status: DC
  Filled 2022-07-09: qty 30, 30d supply, fill #0

## 2022-07-09 NOTE — Telephone Encounter (Signed)
Requesting: Focalin '5mg'$   Contract: 11/10/19 UDS: 05/20/21 Last Visit: 05/14/22 Next Visit: None Last Refill: 05/23/22 #30 and 0RF  Please Advise

## 2022-07-21 ENCOUNTER — Other Ambulatory Visit (HOSPITAL_BASED_OUTPATIENT_CLINIC_OR_DEPARTMENT_OTHER): Payer: Self-pay

## 2022-07-21 ENCOUNTER — Other Ambulatory Visit: Payer: Self-pay | Admitting: Family

## 2022-07-21 DIAGNOSIS — F988 Other specified behavioral and emotional disorders with onset usually occurring in childhood and adolescence: Secondary | ICD-10-CM

## 2022-07-21 MED ORDER — BUPROPION HCL ER (XL) 150 MG PO TB24
150.0000 mg | ORAL_TABLET | Freq: Every day | ORAL | 1 refills | Status: DC
  Filled 2022-07-21: qty 30, 30d supply, fill #0

## 2022-07-21 MED ORDER — DEXMETHYLPHENIDATE HCL ER 25 MG PO CP24
1.0000 | ORAL_CAPSULE | Freq: Every day | ORAL | 0 refills | Status: DC
  Filled 2022-07-21: qty 30, 30d supply, fill #0

## 2022-08-07 ENCOUNTER — Ambulatory Visit (INDEPENDENT_AMBULATORY_CARE_PROVIDER_SITE_OTHER): Payer: Self-pay | Admitting: Psychology

## 2022-08-07 DIAGNOSIS — F331 Major depressive disorder, recurrent, moderate: Secondary | ICD-10-CM

## 2022-08-07 NOTE — Progress Notes (Signed)
[facility] Counselor/Therapist Progress Note  Patient ID: [name], MRN: [id number],    Date: 08/07/2022  Time Spent: 2:00pm-2:50pm   50 minutes   Treatment Type: Individual Therapy  Reported Symptoms: stress  Mental Status Exam: Appearance:  Casual     Behavior: Appropriate  Motor: Normal  Speech/Language:  Normal Rate  Affect: Appropriate  Mood: normal  Thought process: normal  Thought content:   WNL  Sensory/Perceptual disturbances:   WNL  Orientation: oriented to person, place, time/date, and situation  Attention: Good  Concentration: Good  Memory: WNL  Fund of knowledge:  Good  Insight:   Good  Judgment:  Good  Impulse Control: Good   Risk Assessment: Danger to Self:  No Self-injurious Behavior: No Danger to Others: No Duty to Warn:no Physical Aggression / Violence:No  Access to Firearms a concern: No  Gang Involvement:No   Subjective: Pt present for face-to-face individual therapy via video Webex.  Pt consents to telehealth video session due to COVID 19 pandemic. Location of pt: home Location of therapist: home office.   Pt talked about work.  She is really liking her new job at [employer].  She is working in interventional radiology.  The people she works with are nice.   Pt talked about her friendships.   She is setting boundaries on her friend [place]. Pt felt somewhat depressed recently.   She was withdrawing from people.   Sometimes pt feels lonely.  Helped pt process her feelings.   She is now trying to make new friends at her new job.   Encouraged pt to increase self care. Provided supportive therapy.    Interventions: Cognitive Behavioral Therapy and Insight-Oriented  Diagnosis: F33.1  Plan of Care: Recommend ongoing therapy.   Pt participated in setting treatment goals.   Plan to meet monthly.    Treatment Plan Client Abilities/Strengths  Pt is bright, engaging, and motivated for therapy.   Client Treatment  Preferences  Individual therapy.  Client Statement of Needs  Improve coping skills.  Symptoms  Depressed or irritable mood.  Problems Addressed  Unipolar Depression Goals 1. Alleviate depressive symptoms and return to previous level of effective functioning. 2. Appropriately grieve the loss in order to normalize mood and to return to previously adaptive level of functioning. Objective Learn and implement behavioral strategies to overcome depression. Target Date: 2023-06-14 Frequency: Monthly  Progress: 50 Modality: individual  Related Interventions Engage the client in "behavioral activation," increasing his/her activity level and contact with sources of reward, while identifying processes that inhibit activation.  Use behavioral techniques such as instruction, rehearsal, role-playing, role reversal, as needed, to facilitate activity in the client's daily life; reinforce success. Assist the client in developing skills that increase the likelihood of deriving pleasure from behavioral activation (e.g., assertiveness skills, developing an exercise plan, less internal/more external focus, increased social involvement); reinforce success. Objective Identify important people in life, past and present, and describe the quality, good and poor, of those relationships. Target Date: 2023-06-14 Frequency: Monthly  Progress: 50 Modality: individual  Related Interventions Conduct Interpersonal Therapy beginning with the assessment of the client's "interpersonal inventory" of important past and present relationships; develop a case formulation linking depression to grief, interpersonal role disputes, role transitions, and/or interpersonal deficits). Objective Learn and implement problem-solving and decision-making skills. Target Date: 2023-06-14 Frequency: Monthly  Progress: 50 Modality: individual  Related Interventions Conduct Problem-Solving Therapy using techniques such as psychoeducation,  modeling, and role-playing to teach client problem-solving skills (i.e., defining a problem  specifically, generating possible solutions, evaluating the pros and cons of each solution, selecting and implementing a plan of action, evaluating the efficacy of the plan, accepting or revising the plan); role-play application of the problem-solving skill to a real life issue. Encourage in the client the development of a positive problem orientation in which problems and solving them are viewed as a natural part of life and not something to be feared, despaired, or avoided. 3. Develop healthy interpersonal relationships that lead to the alleviation and help prevent the relapse of depression. 4. Develop healthy thinking patterns and beliefs about self, others, and the world that lead to the alleviation and help prevent the relapse of depression. 5. Recognize, accept, and cope with feelings of depression. Diagnosis F33.1  Conditions For Discharge Achievement of treatment goals and objectives   Clint Bolder, LCSW

## 2022-08-16 ENCOUNTER — Other Ambulatory Visit (HOSPITAL_BASED_OUTPATIENT_CLINIC_OR_DEPARTMENT_OTHER): Payer: Self-pay

## 2022-08-16 ENCOUNTER — Telehealth: Payer: Self-pay | Admitting: Physician Assistant

## 2022-08-16 DIAGNOSIS — R6889 Other general symptoms and signs: Secondary | ICD-10-CM

## 2022-08-16 MED ORDER — BENZONATATE 100 MG PO CAPS
100.0000 mg | ORAL_CAPSULE | Freq: Three times a day (TID) | ORAL | 0 refills | Status: DC | PRN
  Filled 2022-08-16: qty 30, 10d supply, fill #0

## 2022-08-16 MED ORDER — AZELASTINE HCL 0.1 % NA SOLN
1.0000 | Freq: Two times a day (BID) | NASAL | 0 refills | Status: DC
  Filled 2022-08-16: qty 30, 30d supply, fill #0

## 2022-08-16 MED ORDER — PROMETHAZINE-DM 6.25-15 MG/5ML PO SYRP
5.0000 mL | ORAL_SOLUTION | Freq: Four times a day (QID) | ORAL | 0 refills | Status: DC | PRN
  Filled 2022-08-16: qty 118, 6d supply, fill #0

## 2022-08-16 NOTE — Progress Notes (Signed)
E visit for Flu like symptoms   We are sorry that you are not feeling well.  Here is how we plan to help! Based on what you have shared with me it looks like you may have flu-like symptoms that should be watched but do not seem to indicate anti-viral treatment.  Influenza or "the flu" is   an infection caused by a respiratory virus. The flu virus is highly contagious and persons who did not receive their yearly flu vaccination may "catch" the flu from close contact.  We have anti-viral medications to treat the viruses that cause this infection. They are not a "cure" and only shorten the course of the infection. These prescriptions are most effective when they are given within the first 2 days of "flu" symptoms. Antiviral medication are indicated if you have a high risk of complications from the flu. You should  also consider an antiviral medication if you are in close contact with someone who is at risk. These medications can help patients avoid complications from the flu  but have side effects that you should know. Possible side effects from Tamiflu or oseltamivir include nausea, vomiting, diarrhea, dizziness, headaches, eye redness, sleep problems or other respiratory symptoms. You should not take Tamiflu if you have an allergy to oseltamivir or any to the ingredients in Tamiflu.  Based upon your symptoms and potential risk factors I recommend that you follow the flu symptoms recommendation that I have listed below.  This is an infection that is most likely caused by a virus. There are no specific treatments other than to help you with the symptoms until the infection runs its course.  We are sorry you are not feeling well.  Here is how we plan to help!  For nasal congestion, you may use an oral decongestants such as Mucinex D or if you have glaucoma or high blood pressure use plain Mucinex.  Saline nasal spray or nasal drops can help and can safely be used as often as needed for congestion.  For  your congestion, I have prescribed Azelastine nasal spray two sprays in each nostril twice a day  If you do not have a history of heart disease, hypertension, diabetes or thyroid disease, prostate/bladder issues or glaucoma, you may also use Sudafed to treat nasal congestion.  It is highly recommended that you consult with a pharmacist or your primary care physician to ensure this medication is safe for you to take.     If you have a cough, you may use cough suppressants such as Delsym and Robitussin.  If you have glaucoma or high blood pressure, you can also use Coricidin HBP.   For cough I have prescribed for you A prescription cough medication called Tessalon Perles 100 mg. You may take 1-2 capsules every 8 hours as needed for cough and Promethazine DM Take 5 mL every 6 hours as needed for cough.  If you have a sore or scratchy throat, use a saltwater gargle-  to  teaspoon of salt dissolved in a 4-ounce to 8-ounce glass of warm water.  Gargle the solution for approximately 15-30 seconds and then spit.  It is important not to swallow the solution.  You can also use throat lozenges/cough drops and Chloraseptic spray to help with throat pain or discomfort.  Warm or cold liquids can also be helpful in relieving throat pain.  For headache, pain or general discomfort, you can use Ibuprofen or Tylenol as directed.   Some authorities believe that zinc sprays  or the use of Echinacea may shorten the course of your symptoms.   ANYONE WHO HAS FLU SYMPTOMS SHOULD: Stay home. The flu is highly contagious and going out or to work exposes others! Be sure to drink plenty of fluids. Water is fine as well as fruit juices, sodas and electrolyte beverages. You may want to stay away from caffeine or alcohol. If you are nauseated, try taking small sips of liquids. How do you know if you are getting enough fluid? Your urine should be a pale yellow or almost colorless. Get rest. Taking a steamy shower or using a  humidifier may help nasal congestion and ease sore throat pain. Using a saline nasal spray works much the same way. Cough drops, hard candies and sore throat lozenges may ease your cough. Line up a caregiver. Have someone check on you regularly.   GET HELP RIGHT AWAY IF: You cannot keep down liquids or your medications. You become short of breath Your fell like you are going to pass out or loose consciousness. Your symptoms persist after you have completed your treatment plan MAKE SURE YOU  Understand these instructions. Will watch your condition. Will get help right away if you are not doing well or get worse.  Your e-visit answers were reviewed by a board certified advanced clinical practitioner to complete your personal care plan.  Depending on the condition, your plan could have included both over the counter or prescription medications.  If there is a problem please reply  once you have received a response from your provider.  Your safety is important to [place].  If you have drug allergies check your prescription carefully.    You can use MyChart to ask questions about today's visit, request a non-urgent call back, or ask for a work or school excuse for 24 hours related to this e-Visit. If it has been greater than 24 hours you will need to follow up with your provider, or enter a new e-Visit to address those concerns.  You will get an e-mail in the next two days asking about your experience.  I hope that your e-visit has been valuable and will speed your recovery. Thank you for using e-visits.  I have spent 5 minutes in review of e-visit questionnaire, review and updating patient chart, medical decision making and response to patient.   Mar Daring, PA-C

## 2022-08-20 ENCOUNTER — Telehealth: Payer: PRIVATE HEALTH INSURANCE | Admitting: Physician Assistant

## 2022-08-20 ENCOUNTER — Encounter: Payer: Self-pay | Admitting: Family

## 2022-08-20 DIAGNOSIS — B9689 Other specified bacterial agents as the cause of diseases classified elsewhere: Secondary | ICD-10-CM

## 2022-08-20 DIAGNOSIS — J208 Acute bronchitis due to other specified organisms: Secondary | ICD-10-CM

## 2022-08-20 MED ORDER — AZITHROMYCIN 250 MG PO TABS: ORAL_TABLET | ORAL | 0 refills | Status: AC

## 2022-08-20 NOTE — Progress Notes (Signed)
We are sorry that you are not feeling well.  Here is how we plan to help!  Based on your presentation I believe you most likely have A cough due to bacteria.  When patients have a fever and a productive cough with a change in color or increased sputum production, we are concerned about bacterial bronchitis.  If left untreated it can progress to pneumonia.  If your symptoms do not improve with your treatment plan it is important that you contact your provider.   I have prescribed Azithromyin 250 mg: two tablets now and then one tablet daily for 4 additonal days    In addition you may continue previously prescribed medications.  From your responses in the eVisit questionnaire you describe inflammation in the upper respiratory tract which is causing a significant cough.  This is commonly called Bronchitis and has four common causes:   Allergies Viral Infections Acid Reflux Bacterial Infection Allergies, viruses and acid reflux are treated by controlling symptoms or eliminating the cause. An example might be a cough caused by taking certain blood pressure medications. You stop the cough by changing the medication. Another example might be a cough caused by acid reflux. Controlling the reflux helps control the cough.  USE OF BRONCHODILATOR ("RESCUE") INHALERS: There is a risk from using your bronchodilator too frequently.  The risk is that over-reliance on a medication which only relaxes the muscles surrounding the breathing tubes can reduce the effectiveness of medications prescribed to reduce swelling and congestion of the tubes themselves.  Although you feel brief relief from the bronchodilator inhaler, your asthma may actually be worsening with the tubes becoming more swollen and filled with mucus.  This can delay other crucial treatments, such as oral steroid medications. If you need to use a bronchodilator inhaler daily, several times per day, you should discuss this with your provider.  There are  probably better treatments that could be used to keep your asthma under control.     HOME CARE Only take medications as instructed by your medical team. Complete the entire course of an antibiotic. Drink plenty of fluids and get plenty of rest. Avoid close contacts especially the very young and the elderly Cover your mouth if you cough or cough into your sleeve. Always remember to wash your hands A steam or ultrasonic humidifier can help congestion.   GET HELP RIGHT AWAY IF: You develop worsening fever. You become short of breath You cough up blood. Your symptoms persist after you have completed your treatment plan MAKE SURE YOU  Understand these instructions. Will watch your condition. Will get help right away if you are not doing well or get worse.    Thank you for choosing an e-visit.  Your e-visit answers were reviewed by a board certified advanced clinical practitioner to complete your personal care plan. Depending upon the condition, your plan could have included both over the counter or prescription medications.  Please review your pharmacy choice. Make sure the pharmacy is open so you can pick up prescription now. If there is a problem, you may contact your provider through [employer] and have the prescription routed to another pharmacy.  Your safety is important to [place]. If you have drug allergies check your prescription carefully.   For the next 24 hours you can use MyChart to ask questions about today's visit, request a non-urgent call back, or ask for a work or school excuse. You will get an email in the next two days asking about your  experience. I hope that your e-visit has been valuable and will speed your recovery.  I have spent 5 minutes in review of e-visit questionnaire, review and updating patient chart, medical decision making and response to patient.   Mar Daring, PA-C

## 2022-08-21 ENCOUNTER — Telehealth: Payer: Self-pay | Admitting: Family

## 2022-08-21 DIAGNOSIS — F988 Other specified behavioral and emotional disorders with onset usually occurring in childhood and adolescence: Secondary | ICD-10-CM

## 2022-08-21 MED ORDER — DEXMETHYLPHENIDATE HCL 5 MG PO TABS: 5.0000 mg | ORAL_TABLET | Freq: Every day | ORAL | 0 refills | Status: DC

## 2022-08-21 NOTE — Telephone Encounter (Signed)
Called patient because mychart messages were getting confusing. Patient is requesting a refill on Focalin (name brand only because generic makes her nauseous) to be sent to her new [facility] in [place] on [facility]. [phone]. Patient has scheduled appt for her next day off with Melissa to follow up on medication.

## 2022-08-21 NOTE — Telephone Encounter (Signed)
Rx sent 

## 2022-08-25 ENCOUNTER — Other Ambulatory Visit: Payer: Self-pay

## 2022-08-25 ENCOUNTER — Other Ambulatory Visit (HOSPITAL_BASED_OUTPATIENT_CLINIC_OR_DEPARTMENT_OTHER): Payer: Self-pay

## 2022-08-25 MED ORDER — BUPROPION HCL ER (XL) 150 MG PO TB24: 150.0000 mg | ORAL_TABLET | Freq: Every day | ORAL | 1 refills | Status: DC

## 2022-08-26 ENCOUNTER — Other Ambulatory Visit (HOSPITAL_BASED_OUTPATIENT_CLINIC_OR_DEPARTMENT_OTHER): Payer: Self-pay

## 2022-08-26 MED ORDER — DEXMETHYLPHENIDATE HCL 10 MG PO TABS
5.0000 mg | ORAL_TABLET | Freq: Every day | ORAL | 0 refills | Status: DC
  Filled 2022-08-26: qty 15, 30d supply, fill #0

## 2022-08-26 MED ORDER — DEXMETHYLPHENIDATE HCL ER 25 MG PO CP24
1.0000 | ORAL_CAPSULE | Freq: Every day | ORAL | 0 refills | Status: DC
  Filled 2022-08-26: qty 30, 30d supply, fill #0

## 2022-08-26 NOTE — Addendum Note (Signed)
Addended by: Debbrah Alar on: 08/26/2022 07:19 AM   Modules accepted: Orders

## 2022-08-26 NOTE — Addendum Note (Signed)
Addended by: Debbrah Alar on: 08/26/2022 07:20 AM   Modules accepted: Orders

## 2022-08-28 ENCOUNTER — Other Ambulatory Visit (HOSPITAL_BASED_OUTPATIENT_CLINIC_OR_DEPARTMENT_OTHER): Payer: Self-pay

## 2022-09-03 ENCOUNTER — Ambulatory Visit: Payer: Commercial Managed Care - PPO | Admitting: Family

## 2022-09-12 ENCOUNTER — Ambulatory Visit (INDEPENDENT_AMBULATORY_CARE_PROVIDER_SITE_OTHER): Payer: PRIVATE HEALTH INSURANCE | Admitting: Psychology

## 2022-09-12 DIAGNOSIS — F331 Major depressive disorder, recurrent, moderate: Secondary | ICD-10-CM | POA: Diagnosis not present

## 2022-09-12 NOTE — Progress Notes (Signed)
[facility] Counselor/Therapist Progress Note  Patient ID: LORALYN RACHEL, MRN: [id number],    Date: 09/12/2022  Time Spent: 11:00am-11:55am   55 minutes   Treatment Type: Individual Therapy  Reported Symptoms: stress  Mental Status Exam: Appearance:  Casual     Behavior: Appropriate  Motor: Normal  Speech/Language:  Normal Rate  Affect: Appropriate  Mood: normal  Thought process: normal  Thought content:   WNL  Sensory/Perceptual disturbances:   WNL  Orientation: oriented to person, place, time/date, and situation  Attention: Good  Concentration: Good  Memory: WNL  Fund of knowledge:  Good  Insight:   Good  Judgment:  Good  Impulse Control: Good   Risk Assessment: Danger to Self:  No Self-injurious Behavior: No Danger to Others: No Duty to Warn:no Physical Aggression / Violence:No  Access to Firearms a concern: No  Gang Involvement:No   Subjective: Pt present for face-to-face individual therapy via video Webex.  Pt consents to telehealth video session due to COVID 19 pandemic. Location of pt: home Location of therapist: home office.   Pt talked about gaining twelve lbs and feeling frustrated about it.   She is having a friend stay with her and they have been going out to eat a lot.   Pt talked about work.  She does not like the current track she is on.  She is working with her boss to improve her schedule.  Pt is also filling in at [employer].  Pt states it has been frustrating working at [employer].  Addressed the issues and concerns.   Worked on Child psychotherapist.   Addressed work dynamics and interpersonal conflicts pt gets into.   Pt wants to look at how she contributes to the negative interactions.    Helped pt process the dynamics and identified that it may be helpful for her to not engage or "enter the ring" when others are aggressive.  Encouraged pt to increase self care. Provided supportive therapy.    Interventions: Cognitive  Behavioral Therapy and Insight-Oriented  Diagnosis: F33.1  Plan of Care: Recommend ongoing therapy.   Pt participated in setting treatment goals.   Plan to meet monthly.    Treatment Plan Client Abilities/Strengths  Pt is bright, engaging, and motivated for therapy.   Client Treatment Preferences  Individual therapy.  Client Statement of Needs  Improve coping skills.  Symptoms  Depressed or irritable mood.  Problems Addressed  Unipolar Depression Goals 1. Alleviate depressive symptoms and return to previous level of effective functioning. 2. Appropriately grieve the loss in order to normalize mood and to return to previously adaptive level of functioning. Objective Learn and implement behavioral strategies to overcome depression. Target Date: 2023-06-14 Frequency: Monthly  Progress: 50 Modality: individual  Related Interventions Engage the client in "behavioral activation," increasing his/her activity level and contact with sources of reward, while identifying processes that inhibit activation.  Use behavioral techniques such as instruction, rehearsal, role-playing, role reversal, as needed, to facilitate activity in the client's daily life; reinforce success. Assist the client in developing skills that increase the likelihood of deriving pleasure from behavioral activation (e.g., assertiveness skills, developing an exercise plan, less internal/more external focus, increased social involvement); reinforce success. Objective Identify important people in life, past and present, and describe the quality, good and poor, of those relationships. Target Date: 2023-06-14 Frequency: Monthly  Progress: 50 Modality: individual  Related Interventions Conduct Interpersonal Therapy beginning with the assessment of the client's "interpersonal inventory" of important past and present relationships; develop  a case formulation linking depression to grief, interpersonal role disputes, role transitions,  and/or interpersonal deficits). Objective Learn and implement problem-solving and decision-making skills. Target Date: 2023-06-14 Frequency: Monthly  Progress: 50 Modality: individual  Related Interventions Conduct Problem-Solving Therapy using techniques such as psychoeducation, modeling, and role-playing to teach client problem-solving skills (i.e., defining a problem specifically, generating possible solutions, evaluating the pros and cons of each solution, selecting and implementing a plan of action, evaluating the efficacy of the plan, accepting or revising the plan); role-play application of the problem-solving skill to a real life issue. Encourage in the client the development of a positive problem orientation in which problems and solving them are viewed as a natural part of life and not something to be feared, despaired, or avoided. 3. Develop healthy interpersonal relationships that lead to the alleviation and help prevent the relapse of depression. 4. Develop healthy thinking patterns and beliefs about self, others, and the world that lead to the alleviation and help prevent the relapse of depression. 5. Recognize, accept, and cope with feelings of depression. Diagnosis F33.1  Conditions For Discharge Achievement of treatment goals and objectives   Clint Bolder, LCSW

## 2022-09-19 ENCOUNTER — Ambulatory Visit: Payer: PRIVATE HEALTH INSURANCE | Admitting: Family

## 2022-09-19 ENCOUNTER — Other Ambulatory Visit: Payer: Self-pay | Admitting: Family

## 2022-09-19 VITALS — BP 107/69 | HR 60 | Temp 98.0°F | Resp 16 | Wt 175.0 lb

## 2022-09-19 DIAGNOSIS — F909 Attention-deficit hyperactivity disorder, unspecified type: Secondary | ICD-10-CM | POA: Diagnosis not present

## 2022-09-19 DIAGNOSIS — E038 Other specified hypothyroidism: Secondary | ICD-10-CM

## 2022-09-19 DIAGNOSIS — E0590 Thyrotoxicosis, unspecified without thyrotoxic crisis or storm: Secondary | ICD-10-CM | POA: Diagnosis not present

## 2022-09-19 DIAGNOSIS — R918 Other nonspecific abnormal finding of lung field: Secondary | ICD-10-CM

## 2022-09-19 DIAGNOSIS — J45909 Unspecified asthma, uncomplicated: Secondary | ICD-10-CM

## 2022-09-19 DIAGNOSIS — F32A Depression, unspecified: Secondary | ICD-10-CM

## 2022-09-19 DIAGNOSIS — Z1211 Encounter for screening for malignant neoplasm of colon: Secondary | ICD-10-CM

## 2022-09-19 LAB — TSH: TSH: 6.22 u[IU]/mL — ABNORMAL HIGH (ref 0.35–5.50)

## 2022-09-19 LAB — T4, FREE: Free T4: 0.62 ng/dL (ref 0.60–1.60)

## 2022-09-19 NOTE — Assessment & Plan Note (Signed)
Clinically euthyroid, obtain TFT's.

## 2022-09-19 NOTE — Progress Notes (Signed)
Subjective:     Patient ID: [name], female    DOB: [birth year], 52 y.o.   MRN: [id number]  Chief Complaint  Patient presents with   Depression    Here for follow up   Anxiety    Here for follow up   ADHD    Here for follow up    Depression        Past medical history includes anxiety.   Anxiety     Patient is in today for follow up.  ADHD- maintained on focalin. Reports that this has been working well for her .   Depression- maintained on wellbutrin.  Reports mood is good.   Asthma-  No recent issues.  Does not use albuterol.   Hyperthyroidism-  Lab Results  Component Value Date   TSH 3.74 01/23/2022    She is having hot flashes.  She is managing without medications at this time.    Health Maintenance Due  Topic Date Due   COLONOSCOPY (Pts 45-81yr Insurance coverage will need to be confirmed)  Never done   COVID-19 Vaccine (4 - 2023-24 season) 04/18/2022   Zoster Vaccines- Shingrix (1 of 2) Never done    Past Medical History:  Diagnosis Date   ADD (attention deficit disorder)    Adjustment disorder    without depression   Allergy    allergic rhinitis   Asthma    hx of exercised induced   Depression    Endometriosis    Former smoker    SVD (spontaneous vaginal delivery)    x 1   Thyroid disease    hyperthyroidism / Graves Disease - no treatment needed for years per patient   Unilateral primary osteoarthritis, right hip 07/17/2020    Past Surgical History:  Procedure Laterality Date   AUGMENTATION MAMMAPLASTY     BREAST BIOPSY Bilateral 2016   BREAST SURGERY  2004   augmentation   DILITATION & CURRETTAGE/HYSTROSCOPY WITH HYDROTHERMAL ABLATION N/A 12/21/2014   Procedure: DILATATION & CURETTAGE/HYSTEROSCOPY WITH HYDROTHERMAL ABLATION;  Surgeon: MDian Queen MD;  [facility];  Service: Gynecology;  Laterality: N/A;   LAPAROSCOPIC TUBAL LIGATION Bilateral 12/21/2014   Procedure: LAPAROSCOPIC TUBAL LIGATION;  Surgeon: MDian Queen  MD;  [facility];  Service: Gynecology;  Laterality: Bilateral;   right ankle surgery  2002   TOTAL HIP ARTHROPLASTY Right 09/21/2020   Procedure: RIGHT TOTAL HIP ARTHROPLASTY ANTERIOR APPROACH;  Surgeon: BMcarthur Rossetti MD;  Location: [facility];  Service: Orthopedics;  Laterality: Right;   TUBAL LIGATION  12/21/14   WISDOM TOOTH EXTRACTION      Family History  Problem Relation Age of Onset   Diabetes Other    Stroke Other    Hypertension Other    Thyroid disease Other    Migraines Other    Breast cancer Maternal Aunt     Social History   Socioeconomic History   Marital status: Single    Spouse name: Not on file   Number of children: 1   Years of education: Not on file   Highest education level: Not on file  Occupational History   Occupation: RTherapist, sports   Employer: [employer] Tobacco Use   Smoking status: Former    Packs/day: 0.50    Years: 15.00    Total pack years: 7.50    Types: Cigarettes    Quit date: 07/19/2011    Years since quitting: 11.1   Smokeless tobacco: Never  Vaping Use   Vaping  Use: Never used  Substance and Sexual Activity   Alcohol use: Not Currently    Comment: socially   Drug use: No   Sexual activity: Yes    Birth control/protection: Surgical    Comment: on loestrin   Other Topics Concern   Not on file  Social History Narrative   Works as an Academic librarian of Radio broadcast assistant Strain: Not on Art therapist Insecurity: Not on file  Transportation Needs: Not on file  Physical Activity: Not on file  Stress: Not on file  Social Connections: Not on file  Intimate Partner Violence: Not on file    Outpatient Medications Prior to Visit  Medication Sig Dispense Refill   buPROPion (WELLBUTRIN XL) 150 MG 24 hr tablet Take 1 tablet (150 mg total) by mouth daily. 90 tablet 1   dexmethylphenidate (FOCALIN) 10 MG tablet Take 0.5 tablets (5 mg total) by mouth daily in the afternoon. 15 tablet 0   Dexmethylphenidate HCl  25 MG CP24 Take 1 capsule by mouth daily at 6 (six) AM. 30 capsule 0   DHEA 50 MG CAPS Take 50 mg by mouth daily.     fexofenadine (ALLEGRA) 180 MG tablet Take 180 mg by mouth daily as needed for allergies.     fluticasone (FLONASE) 50 MCG/ACT nasal spray Place 2 sprays into both nostrils daily. 16 g 0   Multiple Vitamins-Minerals (MULTIVITAMIN WITH MINERALS) tablet Take 1 tablet by mouth daily.     nitrofurantoin, macrocrystal-monohydrate, (MACROBID) 100 MG capsule Take 1 capsule (100 mg total) by mouth as needed. 30 capsule 3   Probiotic Product (PROBIOTIC DAILY PO) Take 1 capsule by mouth daily.     promethazine-dextromethorphan (PROMETHAZINE-DM) 6.25-15 MG/5ML syrup Take 5 mLs by mouth 4 (four) times daily as needed. 118 mL 0   influenza vac split quadrivalent PF (FLUARIX) 0.5 ML injection Inject into the muscle. 0.5 mL 0   azelastine (ASTELIN) 0.1 % nasal spray Place 1 spray into both nostrils 2 (two) times daily. Use in each nostril as directed 30 mL 0   benzonatate (TESSALON) 100 MG capsule Take 1 capsule (100 mg total) by mouth 3 (three) times daily as needed. 30 capsule 0   COVID-19 At Home Antigen Test (CARESTART COVID-19 HOME TEST) KIT take as directed 4 kit 0   fluconazole (DIFLUCAN) 150 MG tablet Take 1 tablet (150 mg total) by mouth every 3 (three) days as needed. 2 tablet 0   No facility-administered medications prior to visit.    Allergies  Allergen Reactions   Escitalopram Oxalate Other (See Comments)    Causes tremors.   Montelukast Sodium Swelling    Causes swelling and pain in the joints.   Paroxetine Hcl     Too high of a dose makes depressed    Wellbutrin [Bupropion] Other (See Comments)    Did well on a low dose but higher dose caused depression.    Review of Systems  Psychiatric/Behavioral:  Positive for depression.       See HPI Objective:    Physical Exam Constitutional:      General: She is not in acute distress.    Appearance: Normal appearance. She  is well-developed.  HENT:     Head: Normocephalic and atraumatic.     Right Ear: External ear normal.     Left Ear: External ear normal.  Eyes:     General: No scleral icterus. Neck:     Thyroid: No thyromegaly.  Cardiovascular:  Rate and Rhythm: Normal rate and regular rhythm.     Heart sounds: Normal heart sounds. No murmur heard. Pulmonary:     Effort: Pulmonary effort is normal. No respiratory distress.     Breath sounds: Normal breath sounds. No wheezing.  Musculoskeletal:     Cervical back: Neck supple.  Skin:    General: Skin is warm and dry.  Neurological:     Mental Status: She is alert and oriented to person, place, and time.  Psychiatric:        Mood and Affect: Mood normal.        Behavior: Behavior normal.        Thought Content: Thought content normal.        Judgment: Judgment normal.     BP 107/69 (BP Location: Right Arm, Patient Position: Sitting, Cuff Size: Small)   Pulse 60   Temp 98 F (36.7 C) (Oral)   Resp 16   Wt 175 lb (79.4 kg)   SpO2 100%   BMI 28.25 kg/m  Wt Readings from Last 3 Encounters:  09/19/22 175 lb (79.4 kg)  01/28/22 170 lb (77.1 kg)  06/10/21 161 lb 3.2 oz (73.1 kg)       Assessment & Plan:   Problem List Items Addressed This Visit       Unprioritized   Pulmonary nodules/lesions, multiple    Never followed through with lung CT follow up per pulmonology. She is willing to update at this time.       Relevant Orders   CT Chest Wo Contrast   Depression    Stable on wellbutrin- having some situational work stress but is handling it pretty well. Monitor.       Attention deficit disorder - Primary    Stable on current dose of focalin. Continue same. UDS today, Controlled substance contract updated.       Relevant Orders   DRUG MONITORING, PANEL 8 WITH CONFIRMATION, URINE   Asthma    Stable.  Monitor.       Other Visit Diagnoses     Hyperthyroidism       Relevant Orders   TSH   T4, free   Screening for  colon cancer       Relevant Orders   Cologuard      Declines colonoscopy, ok with cologuard.  I have discontinued Mellody Dance. Doyon's influenza vac split quadrivalent PF, Carestart COVID-19 Home Test, fluconazole, benzonatate, and azelastine. I am also having her maintain her fexofenadine, multivitamin with minerals, DHEA, Probiotic Product (PROBIOTIC DAILY PO), fluticasone, nitrofurantoin (macrocrystal-monohydrate), promethazine-dextromethorphan, buPROPion, Dexmethylphenidate HCl, and dexmethylphenidate.  No orders of the defined types were placed in this encounter.

## 2022-09-19 NOTE — Assessment & Plan Note (Signed)
Stable on current dose of focalin. Continue same. UDS today, Controlled substance contract updated.

## 2022-09-19 NOTE — Assessment & Plan Note (Signed)
Stable on wellbutrin- having some situational work stress but is handling it pretty well. Monitor.

## 2022-09-19 NOTE — Assessment & Plan Note (Signed)
Never followed through with lung CT follow up per pulmonology. She is willing to update at this time.

## 2022-09-19 NOTE — Assessment & Plan Note (Signed)
>>  ASSESSMENT AND PLAN FOR DEPRESSION WRITTEN ON 09/19/2022  9:34 AM BY O'SULLIVAN, Eddie Koc, NP  Stable on wellbutrin - having some situational work stress but is handling it pretty well. Monitor.

## 2022-09-19 NOTE — Assessment & Plan Note (Signed)
Stable.  Monitor.  

## 2022-09-19 NOTE — Telephone Encounter (Signed)
Lab work shows borderline low thyroid. Given her recent weight gain, I would like to start her on low dose synthroid and repeat TSH in 6 weeks. Please send synthroid to preferred pharmacy.

## 2022-09-20 ENCOUNTER — Telehealth (HOSPITAL_BASED_OUTPATIENT_CLINIC_OR_DEPARTMENT_OTHER): Payer: Self-pay

## 2022-09-20 ENCOUNTER — Encounter: Payer: Self-pay | Admitting: Family

## 2022-09-20 DIAGNOSIS — E0590 Thyrotoxicosis, unspecified without thyrotoxic crisis or storm: Secondary | ICD-10-CM

## 2022-09-20 DIAGNOSIS — R5383 Other fatigue: Secondary | ICD-10-CM

## 2022-09-20 LAB — DRUG MONITORING, PANEL 8 WITH CONFIRMATION, URINE
6 Acetylmorphine: NEGATIVE ng/mL (ref <10)
Alcohol Metabolites: NEGATIVE ng/mL (ref <500)
Amphetamines: NEGATIVE ng/mL (ref <500)
Benzodiazepines: NEGATIVE ng/mL (ref <100)
Buprenorphine, Urine: NEGATIVE ng/mL (ref <5)
Cocaine Metabolite: NEGATIVE ng/mL (ref <150)
Creatinine: 12.7 mg/dL — ABNORMAL LOW (ref >=20.0)
MDMA: NEGATIVE ng/mL (ref <500)
Marijuana Metabolite: NEGATIVE ng/mL (ref <20)
Opiates: NEGATIVE ng/mL (ref <100)
Oxidant: NEGATIVE ug/mL (ref <200)
Oxycodone: NEGATIVE ng/mL (ref <100)
Specific Gravity: 1.003 (ref >=1.003)
pH: 6.7 (ref 4.5–9.0)

## 2022-09-20 LAB — DM TEMPLATE

## 2022-09-22 ENCOUNTER — Other Ambulatory Visit: Payer: Self-pay | Admitting: Family

## 2022-09-22 DIAGNOSIS — E0590 Thyrotoxicosis, unspecified without thyrotoxic crisis or storm: Secondary | ICD-10-CM

## 2022-09-24 ENCOUNTER — Other Ambulatory Visit (HOSPITAL_BASED_OUTPATIENT_CLINIC_OR_DEPARTMENT_OTHER): Payer: Self-pay

## 2022-09-24 ENCOUNTER — Other Ambulatory Visit: Payer: Self-pay | Admitting: Family

## 2022-09-24 DIAGNOSIS — F988 Other specified behavioral and emotional disorders with onset usually occurring in childhood and adolescence: Secondary | ICD-10-CM

## 2022-09-24 MED ORDER — LEVOTHYROXINE SODIUM 25 MCG PO TABS
25.0000 ug | ORAL_TABLET | Freq: Every day | ORAL | 0 refills | Status: DC
  Filled 2022-09-24: qty 30, 30d supply, fill #0
  Filled 2022-10-21: qty 30, 30d supply, fill #1
  Filled 2022-11-19: qty 30, 30d supply, fill #2

## 2022-09-24 NOTE — Telephone Encounter (Signed)
Patient advised of results and new medication, she agrees with plan of care and rx sent to [place] at her request

## 2022-09-25 ENCOUNTER — Other Ambulatory Visit (INDEPENDENT_AMBULATORY_CARE_PROVIDER_SITE_OTHER): Payer: PRIVATE HEALTH INSURANCE

## 2022-09-25 ENCOUNTER — Encounter: Payer: Self-pay | Admitting: Family

## 2022-09-25 ENCOUNTER — Other Ambulatory Visit (HOSPITAL_BASED_OUTPATIENT_CLINIC_OR_DEPARTMENT_OTHER): Payer: Self-pay

## 2022-09-25 DIAGNOSIS — E0590 Thyrotoxicosis, unspecified without thyrotoxic crisis or storm: Secondary | ICD-10-CM

## 2022-09-25 DIAGNOSIS — R5383 Other fatigue: Secondary | ICD-10-CM

## 2022-09-25 DIAGNOSIS — R911 Solitary pulmonary nodule: Secondary | ICD-10-CM

## 2022-09-25 LAB — CBC WITH DIFFERENTIAL/PLATELET
Basophils Absolute: 0.1 K/uL (ref 0.0–0.1)
Basophils Relative: 1.8 % (ref 0.0–3.0)
Eosinophils Absolute: 0.1 K/uL (ref 0.0–0.7)
Eosinophils Relative: 3.5 % (ref 0.0–5.0)
HCT: 41.9 % (ref 36.0–46.0)
Hemoglobin: 14.2 g/dL (ref 12.0–15.0)
Lymphocytes Relative: 49.3 % — ABNORMAL HIGH (ref 12.0–46.0)
Lymphs Abs: 1.5 K/uL (ref 0.7–4.0)
MCHC: 34 g/dL (ref 30.0–36.0)
MCV: 93.9 fl (ref 78.0–100.0)
Monocytes Absolute: 0.2 K/uL (ref 0.1–1.0)
Monocytes Relative: 7.8 % (ref 3.0–12.0)
Neutro Abs: 1.2 K/uL — ABNORMAL LOW (ref 1.4–7.7)
Neutrophils Relative %: 37.6 % — ABNORMAL LOW (ref 43.0–77.0)
Platelets: 193 K/uL (ref 150.0–400.0)
RBC: 4.46 Mil/uL (ref 3.87–5.11)
RDW: 12.6 % (ref 11.5–15.5)
WBC: 3.1 K/uL — ABNORMAL LOW (ref 4.0–10.5)

## 2022-09-25 LAB — COMPREHENSIVE METABOLIC PANEL WITH GFR
ALT: 14 U/L (ref 0–35)
AST: 18 U/L (ref 0–37)
Albumin: 4.7 g/dL (ref 3.5–5.2)
Alkaline Phosphatase: 65 U/L (ref 39–117)
BUN: 24 mg/dL — ABNORMAL HIGH (ref 6–23)
CO2: 29 meq/L (ref 19–32)
Calcium: 9.5 mg/dL (ref 8.4–10.5)
Chloride: 103 meq/L (ref 96–112)
Creatinine, Ser: 0.95 mg/dL (ref 0.40–1.20)
GFR: 69.94 mL/min
Glucose, Bld: 55 mg/dL — ABNORMAL LOW (ref 70–99)
Potassium: 3.9 meq/L (ref 3.5–5.1)
Sodium: 141 meq/L (ref 135–145)
Total Bilirubin: 0.5 mg/dL (ref 0.2–1.2)
Total Protein: 7.7 g/dL (ref 6.0–8.3)

## 2022-09-25 LAB — T3, FREE: T3, Free: 3 pg/mL (ref 2.3–4.2)

## 2022-09-25 MED ORDER — DEXMETHYLPHENIDATE HCL 10 MG PO TABS
5.0000 mg | ORAL_TABLET | Freq: Every day | ORAL | 0 refills | Status: DC
  Filled 2022-09-25: qty 15, 30d supply, fill #0

## 2022-09-25 MED ORDER — DEXMETHYLPHENIDATE HCL ER 25 MG PO CP24
1.0000 | ORAL_CAPSULE | Freq: Every day | ORAL | 0 refills | Status: DC
  Filled 2022-09-25: qty 20, 20d supply, fill #0
  Filled 2022-09-25 (×2): qty 10, 10d supply, fill #0

## 2022-09-26 ENCOUNTER — Ambulatory Visit (HOSPITAL_BASED_OUTPATIENT_CLINIC_OR_DEPARTMENT_OTHER): Payer: PRIVATE HEALTH INSURANCE

## 2022-09-26 LAB — THYROGLOBULIN ANTIBODY: Thyroglobulin Ab: 3 IU/mL — ABNORMAL HIGH (ref <=1)

## 2022-09-26 LAB — THYROID PEROXIDASE ANTIBODIES (TPO) (REFL): Thyroperoxidase Ab SerPl-aCnc: 193 IU/mL — ABNORMAL HIGH (ref <9)

## 2022-10-09 ENCOUNTER — Ambulatory Visit: Payer: PRIVATE HEALTH INSURANCE | Admitting: Psychology

## 2022-10-09 DIAGNOSIS — F331 Major depressive disorder, recurrent, moderate: Secondary | ICD-10-CM

## 2022-10-09 NOTE — Progress Notes (Signed)
[facility] Counselor/Therapist Progress Note  Patient ID: [name], MRN: [id number],    Date: 10/09/2022  Time Spent: 4:00pm-4:55pm   55 minutes   Treatment Type: Individual Therapy  Reported Symptoms: stress  Mental Status Exam: Appearance:  Casual     Behavior: Appropriate  Motor: Normal  Speech/Language:  Normal Rate  Affect: Appropriate  Mood: normal  Thought process: normal  Thought content:   WNL  Sensory/Perceptual disturbances:   WNL  Orientation: oriented to person, place, time/date, and situation  Attention: Good  Concentration: Good  Memory: WNL  Fund of knowledge:  Good  Insight:   Good  Judgment:  Good  Impulse Control: Good   Risk Assessment: Danger to Self:  No Self-injurious Behavior: No Danger to Others: No Duty to Warn:no Physical Aggression / Violence:No  Access to Firearms a concern: No  Gang Involvement:No   Subjective: Pt present for face-to-face individual therapy via video Webex.  Pt consents to telehealth video session due to COVID 19 pandemic. Location of pt: home Location of therapist: home office.   Pt talked about her health.  She got diagnosed with hoshimotos disease which is an auto immune disease.  It affects her thyroid.  Pt is having trouble managing her weight.   She has gained weight.   Pt feels depressed and lonely at times.   Pt is under medical care and doing additional testing.   Addressed pt's health concerns.  She is worried about what she is experiencing.   Pt talked about work.  She is working both jobs at [employer].  Pt is very frustrated with the [employer] job. Addressed the issues and concerns.   Worked on Child psychotherapist.   Addressed work dynamics and interpersonal conflicts pt gets into.    Helped pt process the dynamics. Encouraged pt to increase self care. Provided supportive therapy.    Interventions: Cognitive Behavioral Therapy and Insight-Oriented  Diagnosis: F33.1  Plan of  Care: Recommend ongoing therapy.   Pt participated in setting treatment goals.   Plan to meet monthly.    Treatment Plan Client Abilities/Strengths  Pt is bright, engaging, and motivated for therapy.   Client Treatment Preferences  Individual therapy.  Client Statement of Needs  Improve coping skills.  Symptoms  Depressed or irritable mood.  Problems Addressed  Unipolar Depression Goals 1. Alleviate depressive symptoms and return to previous level of effective functioning. 2. Appropriately grieve the loss in order to normalize mood and to return to previously adaptive level of functioning. Objective Learn and implement behavioral strategies to overcome depression. Target Date: 2023-06-14 Frequency: Monthly  Progress: 50 Modality: individual  Related Interventions Engage the client in "behavioral activation," increasing his/her activity level and contact with sources of reward, while identifying processes that inhibit activation.  Use behavioral techniques such as instruction, rehearsal, role-playing, role reversal, as needed, to facilitate activity in the client's daily life; reinforce success. Assist the client in developing skills that increase the likelihood of deriving pleasure from behavioral activation (e.g., assertiveness skills, developing an exercise plan, less internal/more external focus, increased social involvement); reinforce success. Objective Identify important people in life, past and present, and describe the quality, good and poor, of those relationships. Target Date: 2023-06-14 Frequency: Monthly  Progress: 50 Modality: individual  Related Interventions Conduct Interpersonal Therapy beginning with the assessment of the client's "interpersonal inventory" of important past and present relationships; develop a case formulation linking depression to grief, interpersonal role disputes, role transitions, and/or interpersonal deficits). Objective  Learn and implement  problem-solving and decision-making skills. Target Date: 2023-06-14 Frequency: Monthly  Progress: 50 Modality: individual  Related Interventions Conduct Problem-Solving Therapy using techniques such as psychoeducation, modeling, and role-playing to teach client problem-solving skills (i.e., defining a problem specifically, generating possible solutions, evaluating the pros and cons of each solution, selecting and implementing a plan of action, evaluating the efficacy of the plan, accepting or revising the plan); role-play application of the problem-solving skill to a real life issue. Encourage in the client the development of a positive problem orientation in which problems and solving them are viewed as a natural part of life and not something to be feared, despaired, or avoided. 3. Develop healthy interpersonal relationships that lead to the alleviation and help prevent the relapse of depression. 4. Develop healthy thinking patterns and beliefs about self, others, and the world that lead to the alleviation and help prevent the relapse of depression. 5. Recognize, accept, and cope with feelings of depression. Diagnosis F33.1  Conditions For Discharge Achievement of treatment goals and objectives   Clint Bolder, LCSW

## 2022-10-21 ENCOUNTER — Other Ambulatory Visit: Payer: Self-pay

## 2022-10-21 ENCOUNTER — Other Ambulatory Visit (HOSPITAL_BASED_OUTPATIENT_CLINIC_OR_DEPARTMENT_OTHER): Payer: Self-pay

## 2022-10-21 ENCOUNTER — Encounter: Payer: Self-pay | Admitting: Family

## 2022-10-21 ENCOUNTER — Other Ambulatory Visit: Payer: Self-pay | Admitting: Family

## 2022-10-21 DIAGNOSIS — Z0000 Encounter for general adult medical examination without abnormal findings: Secondary | ICD-10-CM

## 2022-10-21 DIAGNOSIS — F988 Other specified behavioral and emotional disorders with onset usually occurring in childhood and adolescence: Secondary | ICD-10-CM

## 2022-10-21 DIAGNOSIS — E0590 Thyrotoxicosis, unspecified without thyrotoxic crisis or storm: Secondary | ICD-10-CM

## 2022-10-21 DIAGNOSIS — R5383 Other fatigue: Secondary | ICD-10-CM

## 2022-10-21 DIAGNOSIS — E559 Vitamin D deficiency, unspecified: Secondary | ICD-10-CM

## 2022-10-21 MED ORDER — DEXMETHYLPHENIDATE HCL 10 MG PO TABS
5.0000 mg | ORAL_TABLET | Freq: Every day | ORAL | 0 refills | Status: DC
  Filled 2022-10-21 – 2022-10-27 (×2): qty 15, 30d supply, fill #0

## 2022-10-21 MED ORDER — DEXMETHYLPHENIDATE HCL ER 25 MG PO CP24
1.0000 | ORAL_CAPSULE | Freq: Every day | ORAL | 0 refills | Status: DC
  Filled 2022-10-21 – 2022-10-27 (×2): qty 30, 30d supply, fill #0

## 2022-10-27 ENCOUNTER — Other Ambulatory Visit (HOSPITAL_BASED_OUTPATIENT_CLINIC_OR_DEPARTMENT_OTHER): Payer: Self-pay

## 2022-10-27 ENCOUNTER — Ambulatory Visit
Admission: RE | Admit: 2022-10-27 | Discharge: 2022-10-27 | Disposition: A | Discharge status: Home / Self Care | Payer: PRIVATE HEALTH INSURANCE | Source: Ambulatory Visit | Attending: Family | Admitting: Family

## 2022-10-27 DIAGNOSIS — R911 Solitary pulmonary nodule: Secondary | ICD-10-CM

## 2022-11-11 ENCOUNTER — Ambulatory Visit: Payer: PRIVATE HEALTH INSURANCE | Admitting: Psychology

## 2022-11-11 DIAGNOSIS — F331 Major depressive disorder, recurrent, moderate: Secondary | ICD-10-CM | POA: Diagnosis not present

## 2022-11-11 NOTE — Progress Notes (Signed)
[facility] Counselor/Therapist Progress Note  Patient ID: [name], MRN: [id number],    Date: 11/11/2022  Time Spent: 9:00am-9:55am   55 minutes   Treatment Type: Individual Therapy  Reported Symptoms: stress  Mental Status Exam: Appearance:  Casual     Behavior: Appropriate  Motor: Normal  Speech/Language:  Normal Rate  Affect: Appropriate  Mood: normal  Thought process: normal  Thought content:   WNL  Sensory/Perceptual disturbances:   WNL  Orientation: oriented to person, place, time/date, and situation  Attention: Good  Concentration: Good  Memory: WNL  Fund of knowledge:  Good  Insight:   Good  Judgment:  Good  Impulse Control: Good   Risk Assessment: Danger to Self:  No Self-injurious Behavior: No Danger to Others: No Duty to Warn:no Physical Aggression / Violence:No  Access to Firearms a concern: No  Gang Involvement:No   Subjective: Pt present for face-to-face individual therapy via video Webex.  Pt consents to telehealth video session due to COVID 19 pandemic. Location of pt: home Location of therapist: home office.   Pt talked about her health.  She has an autoimmune disorder and she has been sick a lot.  Pt is working with a dietician and is doing an elimination diet.  Addressed pt's health concerns. Pt talked about work.   She has quit her prn job at [employer] of the issues there being too stressful.   Addressed the issues and concerns.   Worked on Child psychotherapist.  Pt continues to work full time at [employer].   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    Helped pt process the dynamics. Encouraged pt to increase self care. Provided supportive therapy.    Interventions: Cognitive Behavioral Therapy and Insight-Oriented  Diagnosis: F33.1  Plan of Care: Recommend ongoing therapy.   Pt participated in setting treatment goals.   Plan to meet monthly.    Treatment Plan Client Abilities/Strengths  Pt is bright,  engaging, and motivated for therapy.   Client Treatment Preferences  Individual therapy.  Client Statement of Needs  Improve coping skills.  Symptoms  Depressed or irritable mood.  Problems Addressed  Unipolar Depression Goals 1. Alleviate depressive symptoms and return to previous level of effective functioning. 2. Appropriately grieve the loss in order to normalize mood and to return to previously adaptive level of functioning. Objective Learn and implement behavioral strategies to overcome depression. Target Date: 2023-06-14 Frequency: Monthly  Progress: 50 Modality: individual  Related Interventions Engage the client in "behavioral activation," increasing his/her activity level and contact with sources of reward, while identifying processes that inhibit activation.  Use behavioral techniques such as instruction, rehearsal, role-playing, role reversal, as needed, to facilitate activity in the client's daily life; reinforce success. Assist the client in developing skills that increase the likelihood of deriving pleasure from behavioral activation (e.g., assertiveness skills, developing an exercise plan, less internal/more external focus, increased social involvement); reinforce success. Objective Identify important people in life, past and present, and describe the quality, good and poor, of those relationships. Target Date: 2023-06-14 Frequency: Monthly  Progress: 50 Modality: individual  Related Interventions Conduct Interpersonal Therapy beginning with the assessment of the client's "interpersonal inventory" of important past and present relationships; develop a case formulation linking depression to grief, interpersonal role disputes, role transitions, and/or interpersonal deficits). Objective Learn and implement problem-solving and decision-making skills. Target Date: 2023-06-14 Frequency: Monthly  Progress: 50 Modality: individual  Related Interventions Conduct Problem-Solving  Therapy using techniques such as psychoeducation, modeling,  and role-playing to teach client problem-solving skills (i.e., defining a problem specifically, generating possible solutions, evaluating the pros and cons of each solution, selecting and implementing a plan of action, evaluating the efficacy of the plan, accepting or revising the plan); role-play application of the problem-solving skill to a real life issue. Encourage in the client the development of a positive problem orientation in which problems and solving them are viewed as a natural part of life and not something to be feared, despaired, or avoided. 3. Develop healthy interpersonal relationships that lead to the alleviation and help prevent the relapse of depression. 4. Develop healthy thinking patterns and beliefs about self, others, and the world that lead to the alleviation and help prevent the relapse of depression. 5. Recognize, accept, and cope with feelings of depression. Diagnosis F33.1  Conditions For Discharge Achievement of treatment goals and objectives   Clint Bolder, LCSW

## 2022-11-20 ENCOUNTER — Other Ambulatory Visit: Payer: PRIVATE HEALTH INSURANCE

## 2022-11-20 ENCOUNTER — Other Ambulatory Visit (HOSPITAL_BASED_OUTPATIENT_CLINIC_OR_DEPARTMENT_OTHER): Payer: Self-pay

## 2022-11-26 ENCOUNTER — Other Ambulatory Visit: Payer: Self-pay | Admitting: Family

## 2022-11-26 ENCOUNTER — Encounter: Payer: Self-pay | Admitting: Family

## 2022-11-26 ENCOUNTER — Other Ambulatory Visit (HOSPITAL_BASED_OUTPATIENT_CLINIC_OR_DEPARTMENT_OTHER): Payer: Self-pay

## 2022-11-26 DIAGNOSIS — F988 Other specified behavioral and emotional disorders with onset usually occurring in childhood and adolescence: Secondary | ICD-10-CM

## 2022-11-26 MED ORDER — DEXMETHYLPHENIDATE HCL 10 MG PO TABS
5.0000 mg | ORAL_TABLET | Freq: Every day | ORAL | 0 refills | Status: DC
  Filled 2022-11-26: qty 15, 30d supply, fill #0

## 2022-11-26 MED ORDER — DEXMETHYLPHENIDATE HCL ER 25 MG PO CP24
1.0000 | ORAL_CAPSULE | Freq: Every day | ORAL | 0 refills | Status: DC
  Filled 2022-11-26: qty 30, 30d supply, fill #0

## 2022-11-27 ENCOUNTER — Other Ambulatory Visit (INDEPENDENT_AMBULATORY_CARE_PROVIDER_SITE_OTHER): Payer: PRIVATE HEALTH INSURANCE

## 2022-11-27 ENCOUNTER — Other Ambulatory Visit (HOSPITAL_BASED_OUTPATIENT_CLINIC_OR_DEPARTMENT_OTHER): Payer: Self-pay

## 2022-11-27 ENCOUNTER — Telehealth: Payer: Self-pay | Admitting: *Deleted

## 2022-11-27 DIAGNOSIS — E0590 Thyrotoxicosis, unspecified without thyrotoxic crisis or storm: Secondary | ICD-10-CM

## 2022-11-27 DIAGNOSIS — Z0000 Encounter for general adult medical examination without abnormal findings: Secondary | ICD-10-CM

## 2022-11-27 DIAGNOSIS — R5383 Other fatigue: Secondary | ICD-10-CM

## 2022-11-27 LAB — CBC WITH DIFFERENTIAL/PLATELET
Basophils Absolute: 0 10*3/uL (ref 0.0–0.1)
Basophils Relative: 1.2 % (ref 0.0–3.0)
Eosinophils Absolute: 0.1 10*3/uL (ref 0.0–0.7)
Eosinophils Relative: 2.7 % (ref 0.0–5.0)
HCT: 40.7 % (ref 36.0–46.0)
Hemoglobin: 13.6 g/dL (ref 12.0–15.0)
Lymphocytes Relative: 44.7 % (ref 12.0–46.0)
Lymphs Abs: 1.2 10*3/uL (ref 0.7–4.0)
MCHC: 33.5 g/dL (ref 30.0–36.0)
MCV: 94.4 fl (ref 78.0–100.0)
Monocytes Absolute: 0.2 10*3/uL (ref 0.1–1.0)
Monocytes Relative: 9.3 % (ref 3.0–12.0)
Neutro Abs: 1.1 10*3/uL — ABNORMAL LOW (ref 1.4–7.7)
Neutrophils Relative %: 42.1 % — ABNORMAL LOW (ref 43.0–77.0)
Platelets: 192 10*3/uL (ref 150.0–400.0)
RBC: 4.31 Mil/uL (ref 3.87–5.11)
RDW: 13 % (ref 11.5–15.5)
WBC: 2.7 10*3/uL — ABNORMAL LOW (ref 4.0–10.5)

## 2022-11-27 LAB — COMPREHENSIVE METABOLIC PANEL
ALT: 19 U/L (ref 0–35)
AST: 24 U/L (ref 0–37)
Albumin: 4.7 g/dL (ref 3.5–5.2)
Alkaline Phosphatase: 60 U/L (ref 39–117)
BUN: 19 mg/dL (ref 6–23)
CO2: 33 mEq/L — ABNORMAL HIGH (ref 19–32)
Calcium: 9.9 mg/dL (ref 8.4–10.5)
Chloride: 103 mEq/L (ref 96–112)
Creatinine, Ser: 1 mg/dL (ref 0.40–1.20)
GFR: 65.69 mL/min (ref >60.00)
Glucose, Bld: 56 mg/dL — ABNORMAL LOW (ref 70–99)
Potassium: 4.2 mEq/L (ref 3.5–5.1)
Sodium: 142 mEq/L (ref 135–145)
Total Bilirubin: 0.6 mg/dL (ref 0.2–1.2)
Total Protein: 7.4 g/dL (ref 6.0–8.3)

## 2022-11-27 LAB — CORTISOL: Cortisol, Plasma: 13 ug/dL

## 2022-11-27 LAB — TSH: TSH: 4.69 u[IU]/mL (ref 0.35–5.50)

## 2022-11-27 LAB — VITAMIN D 25 HYDROXY (VIT D DEFICIENCY, FRACTURES): VITD: 37.89 ng/mL (ref 30.00–100.00)

## 2022-11-27 LAB — VITAMIN B12: Vitamin B-12: 504 pg/mL (ref 211–911)

## 2022-11-28 ENCOUNTER — Encounter: Payer: Self-pay | Admitting: Family

## 2022-12-08 ENCOUNTER — Ambulatory Visit (INDEPENDENT_AMBULATORY_CARE_PROVIDER_SITE_OTHER): Payer: PRIVATE HEALTH INSURANCE | Admitting: Psychology

## 2022-12-08 DIAGNOSIS — F331 Major depressive disorder, recurrent, moderate: Secondary | ICD-10-CM

## 2022-12-08 NOTE — Progress Notes (Signed)
[facility] Counselor/Therapist Progress Note  Patient ID: [name], MRN: [id number],    Date: 12/08/2022  Time Spent: 4:00pm-4:55pm    55 minutes   Treatment Type: Individual Therapy  Reported Symptoms: stress  Mental Status Exam: Appearance:  Casual     Behavior: Appropriate  Motor: Normal  Speech/Language:  Normal Rate  Affect: Appropriate  Mood: normal  Thought process: normal  Thought content:   WNL  Sensory/Perceptual disturbances:   WNL  Orientation: oriented to person, place, time/date, and situation  Attention: Good  Concentration: Good  Memory: WNL  Fund of knowledge:  Good  Insight:   Good  Judgment:  Good  Impulse Control: Good   Risk Assessment: Danger to Self:  No Self-injurious Behavior: No Danger to Others: No Duty to Warn:no Physical Aggression / Violence:No  Access to Firearms a concern: No  Gang Involvement:No   Subjective: Pt present for face-to-face individual therapy via video Webex.  Pt consents to telehealth video session due to COVID 19 pandemic. Location of pt: home Location of therapist: home office.   Pt talked about her health.   Addressed pt's health concerns.  Addressed the issues and concerns.   Worked on Optician, dispensing.  Pt talked about issues with some of her friends.  She traveled with some friends and there was some drama.   Helped pt process her feelings and relationship dynamics.  Pt states she is struggling with being single and feeling lonely.   Addressed how pt can increase healthy social connections. Encouraged pt to increase self care. Provided supportive therapy.    Interventions: Cognitive Behavioral Therapy and Insight-Oriented  Diagnosis: F33.1  Plan of Care: Recommend ongoing therapy.   Pt participated in setting treatment goals.   Plan to meet monthly.    Treatment Plan Client Abilities/Strengths  Pt is bright, engaging, and motivated for therapy.   Client Treatment Preferences   Individual therapy.  Client Statement of Needs  Improve coping skills.  Symptoms  Depressed or irritable mood.  Problems Addressed  Unipolar Depression Goals 1. Alleviate depressive symptoms and return to previous level of effective functioning. 2. Appropriately grieve the loss in order to normalize mood and to return to previously adaptive level of functioning. Objective Learn and implement behavioral strategies to overcome depression. Target Date: 2023-06-14 Frequency: Monthly  Progress: 50 Modality: individual  Related Interventions Engage the client in "behavioral activation," increasing his/her activity level and contact with sources of reward, while identifying processes that inhibit activation.  Use behavioral techniques such as instruction, rehearsal, role-playing, role reversal, as needed, to facilitate activity in the client's daily life; reinforce success. Assist the client in developing skills that increase the likelihood of deriving pleasure from behavioral activation (e.g., assertiveness skills, developing an exercise plan, less internal/more external focus, increased social involvement); reinforce success. Objective Identify important people in life, past and present, and describe the quality, good and poor, of those relationships. Target Date: 2023-06-14 Frequency: Monthly  Progress: 50 Modality: individual  Related Interventions Conduct Interpersonal Therapy beginning with the assessment of the client's "interpersonal inventory" of important past and present relationships; develop a case formulation linking depression to grief, interpersonal role disputes, role transitions, and/or interpersonal deficits). Objective Learn and implement problem-solving and decision-making skills. Target Date: 2023-06-14 Frequency: Monthly  Progress: 50 Modality: individual  Related Interventions Conduct Problem-Solving Therapy using techniques such as psychoeducation, modeling, and  role-playing to teach client problem-solving skills (i.e., defining a problem specifically, generating possible solutions, evaluating the pros and cons of  each solution, selecting and implementing a plan of action, evaluating the efficacy of the plan, accepting or revising the plan); role-play application of the problem-solving skill to a real life issue. Encourage in the client the development of a positive problem orientation in which problems and solving them are viewed as a natural part of life and not something to be feared, despaired, or avoided. 3. Develop healthy interpersonal relationships that lead to the alleviation and help prevent the relapse of depression. 4. Develop healthy thinking patterns and beliefs about self, others, and the world that lead to the alleviation and help prevent the relapse of depression. 5. Recognize, accept, and cope with feelings of depression. Diagnosis F33.1  Conditions For Discharge Achievement of treatment goals and objectives   Salomon Fick, LCSW

## 2022-12-11 ENCOUNTER — Ambulatory Visit: Payer: PRIVATE HEALTH INSURANCE | Admitting: Psychology

## 2022-12-22 ENCOUNTER — Other Ambulatory Visit (HOSPITAL_BASED_OUTPATIENT_CLINIC_OR_DEPARTMENT_OTHER): Payer: Self-pay

## 2022-12-22 ENCOUNTER — Other Ambulatory Visit: Payer: Self-pay

## 2022-12-22 ENCOUNTER — Other Ambulatory Visit: Payer: Self-pay | Admitting: Family

## 2022-12-22 MED ORDER — LEVOTHYROXINE SODIUM 25 MCG PO TABS
25.0000 ug | ORAL_TABLET | Freq: Every day | ORAL | 1 refills | Status: DC
  Filled 2022-12-22: qty 30, 30d supply, fill #0
  Filled 2023-01-22 – 2023-01-30 (×2): qty 30, 30d supply, fill #1
  Filled 2023-02-27: qty 30, 30d supply, fill #2

## 2022-12-26 ENCOUNTER — Other Ambulatory Visit (HOSPITAL_BASED_OUTPATIENT_CLINIC_OR_DEPARTMENT_OTHER): Payer: Self-pay

## 2022-12-26 ENCOUNTER — Encounter: Payer: Self-pay | Admitting: Family

## 2022-12-26 MED ORDER — FLUCONAZOLE 150 MG PO TABS
150.0000 mg | ORAL_TABLET | Freq: Once | ORAL | 0 refills | Status: AC
  Filled 2022-12-26: qty 1, 1d supply, fill #0

## 2022-12-26 MED ORDER — AZITHROMYCIN 250 MG PO TABS
ORAL_TABLET | ORAL | 0 refills | Status: DC
  Filled 2022-12-26: qty 6, 5d supply, fill #0

## 2022-12-26 NOTE — Telephone Encounter (Signed)

## 2022-12-30 ENCOUNTER — Other Ambulatory Visit (HOSPITAL_BASED_OUTPATIENT_CLINIC_OR_DEPARTMENT_OTHER): Payer: Self-pay

## 2022-12-30 ENCOUNTER — Other Ambulatory Visit: Payer: Self-pay | Admitting: Family

## 2022-12-30 DIAGNOSIS — F988 Other specified behavioral and emotional disorders with onset usually occurring in childhood and adolescence: Secondary | ICD-10-CM

## 2022-12-30 MED ORDER — DEXMETHYLPHENIDATE HCL ER 25 MG PO CP24
1.0000 | ORAL_CAPSULE | Freq: Every day | ORAL | 0 refills | Status: DC
  Filled 2022-12-30: qty 30, 30d supply, fill #0

## 2022-12-30 MED ORDER — DEXMETHYLPHENIDATE HCL 5 MG PO TABS
5.0000 mg | ORAL_TABLET | Freq: Every day | ORAL | 0 refills | Status: DC
Start: 2022-12-30 — End: 2023-01-31
  Filled 2022-12-30: qty 30, 30d supply, fill #0

## 2022-12-31 ENCOUNTER — Other Ambulatory Visit: Payer: Self-pay

## 2022-12-31 ENCOUNTER — Ambulatory Visit: Payer: PRIVATE HEALTH INSURANCE | Admitting: Family

## 2022-12-31 ENCOUNTER — Other Ambulatory Visit (HOSPITAL_BASED_OUTPATIENT_CLINIC_OR_DEPARTMENT_OTHER): Payer: Self-pay

## 2022-12-31 VITALS — BP 120/72 | HR 59 | Temp 98.2°F | Resp 16 | Wt 177.0 lb

## 2022-12-31 DIAGNOSIS — R059 Cough, unspecified: Secondary | ICD-10-CM

## 2022-12-31 DIAGNOSIS — J069 Acute upper respiratory infection, unspecified: Secondary | ICD-10-CM | POA: Diagnosis not present

## 2022-12-31 LAB — POC COVID19 BINAXNOW: SARS Coronavirus 2 Ag: NEGATIVE

## 2022-12-31 NOTE — Assessment & Plan Note (Signed)
New. She is on day 7.  Covid testing is negative. Advised patient that I believe her symptoms are viral in nature and should resolve in the next 3-4 days. Discussed supportive measures. She will let me know if her symptoms worsen or fail to improve.

## 2022-12-31 NOTE — Progress Notes (Signed)
Subjective:     Patient ID: [name], female    DOB: 07/01/1971, 52 y.o.   MRN: [id number]  Chief Complaint  Patient presents with   Cough    Complains of productive cough   Nasal Congestion    Complains of nasal congestion   Sore Throat    Complains of on and off sore throat    Cough  Sore Throat  Associated symptoms include coughing.   Patient is in today with c/o nasal congestion/productive cough and sore throat.  Denies fever. Symptoms began last Thursday with a sore throat. She has not done a covid test.  We gave her an rx for zpak on 5/10 but symptoms have not improved. Several coworkers have recently been ill with similar symptoms.   Cough/nasal congestion, no significant sore throat today.    Health Maintenance Due  Topic Date Due   COLONOSCOPY (Pts 45-16yrs Insurance coverage will need to be confirmed)  Never done   COVID-19 Vaccine (4 - 2023-24 season) 04/18/2022   Zoster Vaccines- Shingrix (1 of 2) Never done    Past Medical History:  Diagnosis Date   ADD (attention deficit disorder)    Adjustment disorder    without depression   Allergy    allergic rhinitis   Asthma    hx of exercised induced   Depression    Endometriosis    Former smoker    SVD (spontaneous vaginal delivery)    x 1   Thyroid disease    hyperthyroidism / Graves Disease - no treatment needed for years per patient   Unilateral primary osteoarthritis, right hip 07/17/2020    Past Surgical History:  Procedure Laterality Date   AUGMENTATION MAMMAPLASTY     BREAST BIOPSY Bilateral 2016   BREAST SURGERY  2004   augmentation   DILITATION & CURRETTAGE/HYSTROSCOPY WITH HYDROTHERMAL ABLATION N/A 12/21/2014   Procedure: DILATATION & CURETTAGE/HYSTEROSCOPY WITH HYDROTHERMAL ABLATION;  Surgeon: Marcelle Overlie, MD;  Location: [facility];  Service: Gynecology;  Laterality: N/A;   LAPAROSCOPIC TUBAL LIGATION Bilateral 12/21/2014   Procedure: LAPAROSCOPIC TUBAL LIGATION;  Surgeon: Marcelle Overlie, MD;  Location: [facility];  Service: Gynecology;  Laterality: Bilateral;   right ankle surgery  2002   TOTAL HIP ARTHROPLASTY Right 09/21/2020   Procedure: RIGHT TOTAL HIP ARTHROPLASTY ANTERIOR APPROACH;  Surgeon: Kathryne Hitch, MD;  Location: [facility];  Service: Orthopedics;  Laterality: Right;   TUBAL LIGATION  12/21/14   WISDOM TOOTH EXTRACTION      Family History  Problem Relation Age of Onset   Diabetes Other    Stroke Other    Hypertension Other    Thyroid disease Other    Migraines Other    Breast cancer Maternal Aunt     Social History   Socioeconomic History   Marital status: Single    Spouse name: Not on file   Number of children: 1   Years of education: Not on file   Highest education level: Not on file  Occupational History   Occupation: Charity fundraiser    Employer: [employer]  Tobacco Use   Smoking status: Former    Packs/day: 0.50    Years: 15.00    Additional pack years: 0.00    Total pack years: 7.50    Types: Cigarettes    Quit date: 07/19/2011    Years since quitting: 11.4   Smokeless tobacco: Never  Vaping Use   Vaping Use: Never used  Substance and Sexual Activity   Alcohol  use: Not Currently    Comment: socially   Drug use: No   Sexual activity: Yes    Birth control/protection: Surgical    Comment: on loestrin   Other Topics Concern   Not on file  Social History Narrative   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 90 tablet 1   dexmethylphenidate (FOCALIN) 5 MG tablet Take 1 tablet (5 mg total) by mouth daily in the afternoon. 30  tablet 0   Dexmethylphenidate HCl 25 MG CP24 Take 1 capsule (25 mg total) by mouth daily at 6 (six) AM. 30 capsule 0   DHEA 50 MG CAPS Take 50 mg by mouth daily.     fexofenadine (ALLEGRA) 180 MG tablet Take 180 mg by mouth daily as needed for allergies.     fluticasone (FLONASE) 50 MCG/ACT nasal spray Place 2 sprays into both nostrils daily. 16 g 0   levothyroxine (SYNTHROID) 25 MCG tablet Take 1 tablet (25 mcg total) by mouth daily. 90 tablet 1   Multiple Vitamins-Minerals (MULTIVITAMIN WITH MINERALS) tablet Take 1 tablet by mouth daily.     nitrofurantoin, macrocrystal-monohydrate, (MACROBID) 100 MG capsule Take 1 capsule (100 mg total) by mouth as needed. 30 capsule 3   Probiotic Product (PROBIOTIC DAILY PO) Take 1 capsule by mouth daily.     azithromycin (ZITHROMAX) 250 MG tablet Take 2 tablets on day 1, then 1 tablet daily on days 2 through 5 6 tablet 0   No facility-administered medications prior to visit.    Allergies  Allergen Reactions   Escitalopram Oxalate Other (See Comments)    Causes tremors.   Montelukast Sodium Swelling    Causes swelling and pain in the joints.   Paroxetine Hcl     Too high of a dose makes depressed    Wellbutrin [Bupropion] Other (See Comments)    Did well on a low dose but higher dose caused depression.    Review of Systems  Respiratory:  Positive for cough.        Objective:    Physical Exam Constitutional:      General: She is not in acute distress.    Appearance: Normal appearance. She is well-developed.  HENT:     Head: Normocephalic and atraumatic.     Right Ear: External ear normal. Tympanic membrane is retracted. Tympanic membrane is not erythematous.     Left Ear: Tympanic membrane and external ear normal.     Mouth/Throat:     Tongue: No lesions.     Pharynx: Uvula midline. No posterior oropharyngeal erythema.     Tonsils: No tonsillar exudate or tonsillar abscesses.  Eyes:     General: No scleral icterus. Neck:      Thyroid: No thyromegaly.  Cardiovascular:     Rate and Rhythm: Normal rate and regular rhythm.     Heart sounds: Normal heart sounds. No murmur heard. Pulmonary:     Effort: Pulmonary effort is normal. No respiratory distress.     Breath sounds: Normal breath sounds. No wheezing.  Musculoskeletal:  Cervical back: Neck supple.  Skin:    General: Skin is warm and dry.  Neurological:     Mental Status: She is alert and oriented to person, place, and time.  Psychiatric:        Mood and Affect: Mood normal.        Behavior: Behavior normal.        Thought Content: Thought content normal.        Judgment: Judgment normal.     BP 120/72 (BP Location: Right Arm, Patient Position: Sitting, Cuff Size: Small)   Pulse (!) 59   Temp 98.2 F (36.8 C) (Oral)   Resp 16   Wt 177 lb (80.3 kg)   SpO2 99%   BMI 28.57 kg/m  Wt Readings from Last 3 Encounters:  12/31/22 177 lb (80.3 kg)  09/19/22 175 lb (79.4 kg)  01/28/22 170 lb (77.1 kg)       Assessment & Plan:   Problem List Items Addressed This Visit       Unprioritized   Viral URI with cough    New. She is on day 7.  Covid testing is negative. Advised patient that I believe her symptoms are viral in nature and should resolve in the next 3-4 days. Discussed supportive measures. She will let me know if her symptoms worsen or fail to improve.       Other Visit Diagnoses     Cough, unspecified type    -  Primary   Relevant Orders   POC COVID-19 (Completed)       I have discontinued Cambelle P. Doerr's azithromycin. I am also having her maintain her fexofenadine, multivitamin with minerals, DHEA, Probiotic Product (PROBIOTIC DAILY PO), fluticasone, nitrofurantoin (macrocrystal-monohydrate), buPROPion, levothyroxine, Dexmethylphenidate HCl, and dexmethylphenidate.  No orders of the defined types were placed in this encounter.

## 2023-01-01 ENCOUNTER — Other Ambulatory Visit: Payer: Self-pay

## 2023-01-07 ENCOUNTER — Encounter: Payer: Self-pay | Admitting: Family

## 2023-01-07 ENCOUNTER — Other Ambulatory Visit (HOSPITAL_BASED_OUTPATIENT_CLINIC_OR_DEPARTMENT_OTHER): Payer: Self-pay

## 2023-01-07 MED ORDER — AMOXICILLIN-POT CLAVULANATE 875-125 MG PO TABS
1.0000 | ORAL_TABLET | Freq: Two times a day (BID) | ORAL | 0 refills | Status: DC
  Filled 2023-01-07: qty 20, 10d supply, fill #0

## 2023-01-08 ENCOUNTER — Ambulatory Visit: Payer: PRIVATE HEALTH INSURANCE | Admitting: Psychology

## 2023-01-08 DIAGNOSIS — F331 Major depressive disorder, recurrent, moderate: Secondary | ICD-10-CM

## 2023-01-08 NOTE — Progress Notes (Signed)
[facility] Counselor/Therapist Progress Note  Patient ID: DEKLYN LIKELY, MRN: [id number],    Date: 01/08/2023  Time Spent: 10:00am-10:55am   55 minutes   Treatment Type: Individual Therapy  Reported Symptoms: stress  Mental Status Exam: Appearance:  Casual     Behavior: Appropriate  Motor: Normal  Speech/Language:  Normal Rate  Affect: Appropriate  Mood: normal  Thought process: normal  Thought content:   WNL  Sensory/Perceptual disturbances:   WNL  Orientation: oriented to person, place, time/date, and situation  Attention: Good  Concentration: Good  Memory: WNL  Fund of knowledge:  Good  Insight:   Good  Judgment:  Good  Impulse Control: Good   Risk Assessment: Danger to Self:  No Self-injurious Behavior: No Danger to Others: No Duty to Warn:no Physical Aggression / Violence:No  Access to Firearms a concern: No  Gang Involvement:No   Subjective: Pt present for face-to-face individual therapy via video.  Pt consents to telehealth video session due to COVID 19 pandemic. Location of pt: home Location of therapist: home office.   Pt talked about her health.   Pt has been sick again and is feeling frustrated that she is not feeling well.  Pt has a chronic illness that is hard to cope with. Addressed the issues and concerns.   Worked on Optician, dispensing.  Pt talked about planning to go on vacation with a friend of hers.  Pt states she really needs to vacation bc she has been feeling down and did not have anything to look forward to.   Helped pt process her feelings. Pt states she is struggling with being single and feeling lonely.   Addressed how pt can increase healthy social connections. Encouraged pt to increase self care. Provided supportive therapy.    Interventions: Cognitive Behavioral Therapy and Insight-Oriented  Diagnosis: F33.1  Plan of Care: Recommend ongoing therapy.   Pt participated in setting treatment goals.   Plan to meet monthly.     Treatment Plan Client Abilities/Strengths  Pt is bright, engaging, and motivated for therapy.   Client Treatment Preferences  Individual therapy.  Client Statement of Needs  Improve coping skills.  Symptoms  Depressed or irritable mood.  Problems Addressed  Unipolar Depression Goals 1. Alleviate depressive symptoms and return to previous level of effective functioning. 2. Appropriately grieve the loss in order to normalize mood and to return to previously adaptive level of functioning. Objective Learn and implement behavioral strategies to overcome depression. Target Date: 2023-06-14 Frequency: Monthly  Progress: 50 Modality: individual  Related Interventions Engage the client in "behavioral activation," increasing his/her activity level and contact with sources of reward, while identifying processes that inhibit activation.  Use behavioral techniques such as instruction, rehearsal, role-playing, role reversal, as needed, to facilitate activity in the client's daily life; reinforce success. Assist the client in developing skills that increase the likelihood of deriving pleasure from behavioral activation (e.g., assertiveness skills, developing an exercise plan, less internal/more external focus, increased social involvement); reinforce success. Objective Identify important people in life, past and present, and describe the quality, good and poor, of those relationships. Target Date: 2023-06-14 Frequency: Monthly  Progress: 50 Modality: individual  Related Interventions Conduct Interpersonal Therapy beginning with the assessment of the client's "interpersonal inventory" of important past and present relationships; develop a case formulation linking depression to grief, interpersonal role disputes, role transitions, and/or interpersonal deficits). Objective Learn and implement problem-solving and decision-making skills. Target Date: 2023-06-14 Frequency: Monthly  Progress: 50  Modality: individual  Related Interventions Conduct Problem-Solving Therapy using techniques such as psychoeducation, modeling, and role-playing to teach client problem-solving skills (i.e., defining a problem specifically, generating possible solutions, evaluating the pros and cons of each solution, selecting and implementing a plan of action, evaluating the efficacy of the plan, accepting or revising the plan); role-play application of the problem-solving skill to a real life issue. Encourage in the client the development of a positive problem orientation in which problems and solving them are viewed as a natural part of life and not something to be feared, despaired, or avoided. 3. Develop healthy interpersonal relationships that lead to the alleviation and help prevent the relapse of depression. 4. Develop healthy thinking patterns and beliefs about self, others, and the world that lead to the alleviation and help prevent the relapse of depression. 5. Recognize, accept, and cope with feelings of depression. Diagnosis F33.1  Conditions For Discharge Achievement of treatment goals and objectives   Salomon Fick, LCSW

## 2023-01-23 ENCOUNTER — Other Ambulatory Visit (HOSPITAL_BASED_OUTPATIENT_CLINIC_OR_DEPARTMENT_OTHER): Payer: Self-pay

## 2023-01-30 ENCOUNTER — Other Ambulatory Visit (HOSPITAL_BASED_OUTPATIENT_CLINIC_OR_DEPARTMENT_OTHER): Payer: Self-pay

## 2023-01-31 ENCOUNTER — Other Ambulatory Visit: Payer: Self-pay | Admitting: Family

## 2023-01-31 DIAGNOSIS — F988 Other specified behavioral and emotional disorders with onset usually occurring in childhood and adolescence: Secondary | ICD-10-CM

## 2023-01-31 MED ORDER — DEXMETHYLPHENIDATE HCL ER 25 MG PO CP24
1.0000 | ORAL_CAPSULE | Freq: Every day | ORAL | 0 refills | Status: DC
  Filled 2023-01-31: qty 30, 30d supply, fill #0

## 2023-01-31 MED ORDER — DEXMETHYLPHENIDATE HCL 5 MG PO TABS
5.0000 mg | ORAL_TABLET | Freq: Every day | ORAL | 0 refills | Status: DC
Start: 2023-01-31 — End: 2023-02-27
  Filled 2023-01-31: qty 30, 30d supply, fill #0

## 2023-02-01 ENCOUNTER — Other Ambulatory Visit (HOSPITAL_BASED_OUTPATIENT_CLINIC_OR_DEPARTMENT_OTHER): Payer: Self-pay

## 2023-02-03 ENCOUNTER — Ambulatory Visit (INDEPENDENT_AMBULATORY_CARE_PROVIDER_SITE_OTHER): Payer: PRIVATE HEALTH INSURANCE | Admitting: Psychology

## 2023-02-03 DIAGNOSIS — F331 Major depressive disorder, recurrent, moderate: Secondary | ICD-10-CM

## 2023-02-03 NOTE — Progress Notes (Signed)
[facility] Counselor/Therapist Progress Note  Patient ID: [name], MRN: [id number],    Date: 02/03/2023  Time Spent: 11:00am-11:50am   50 minutes   Treatment Type: Individual Therapy  Reported Symptoms: stress  Mental Status Exam: Appearance:  Casual     Behavior: Appropriate  Motor: Normal  Speech/Language:  Normal Rate  Affect: Appropriate  Mood: normal  Thought process: normal  Thought content:   WNL  Sensory/Perceptual disturbances:   WNL  Orientation: oriented to person, place, time/date, and situation  Attention: Good  Concentration: Good  Memory: WNL  Fund of knowledge:  Good  Insight:   Good  Judgment:  Good  Impulse Control: Good   Risk Assessment: Danger to Self:  No Self-injurious Behavior: No Danger to Others: No Duty to Warn:no Physical Aggression / Violence:No  Access to Firearms a concern: No  Gang Involvement:No   Subjective: Pt present for face-to-face individual therapy via video.  Pt consents to telehealth video session and is aware of limitations of video sessions. Location of pt: home Location of therapist: home office.   Pt talked about her health.   She states she has a lot of pain bc of her autoimmune disease.   Pt also feels fatigue often.  Worked on coping strategies.  Pt states she is struggling with being single and feeling lonely.  Pt feels badly when her friends cancel on her.  Addressed how pt can increase healthy social connections. Encouraged pt to increase self care. Provided supportive therapy.    Interventions: Cognitive Behavioral Therapy and Insight-Oriented  Diagnosis: F33.1  Plan of Care: Recommend ongoing therapy.   Pt participated in setting treatment goals.   Plan to meet monthly.    Treatment Plan Client Abilities/Strengths  Pt is bright, engaging, and motivated for therapy.   Client Treatment Preferences  Individual therapy.  Client Statement of Needs  Improve coping skills.  Symptoms   Depressed or irritable mood.  Problems Addressed  Unipolar Depression Goals 1. Alleviate depressive symptoms and return to previous level of effective functioning. 2. Appropriately grieve the loss in order to normalize mood and to return to previously adaptive level of functioning. Objective Learn and implement behavioral strategies to overcome depression. Target Date: 2023-06-14 Frequency: Monthly  Progress: 50 Modality: individual  Related Interventions Engage the client in "behavioral activation," increasing his/her activity level and contact with sources of reward, while identifying processes that inhibit activation.  Use behavioral techniques such as instruction, rehearsal, role-playing, role reversal, as needed, to facilitate activity in the client's daily life; reinforce success. Assist the client in developing skills that increase the likelihood of deriving pleasure from behavioral activation (e.g., assertiveness skills, developing an exercise plan, less internal/more external focus, increased social involvement); reinforce success. Objective Identify important people in life, past and present, and describe the quality, good and poor, of those relationships. Target Date: 2023-06-14 Frequency: Monthly  Progress: 50 Modality: individual  Related Interventions Conduct Interpersonal Therapy beginning with the assessment of the client's "interpersonal inventory" of important past and present relationships; develop a case formulation linking depression to grief, interpersonal role disputes, role transitions, and/or interpersonal deficits). Objective Learn and implement problem-solving and decision-making skills. Target Date: 2023-06-14 Frequency: Monthly  Progress: 50 Modality: individual  Related Interventions Conduct Problem-Solving Therapy using techniques such as psychoeducation, modeling, and role-playing to teach client problem-solving skills (i.e., defining a problem specifically,  generating possible solutions, evaluating the pros and cons of each solution, selecting and implementing a plan of action, evaluating the efficacy  of the plan, accepting or revising the plan); role-play application of the problem-solving skill to a real life issue. Encourage in the client the development of a positive problem orientation in which problems and solving them are viewed as a natural part of life and not something to be feared, despaired, or avoided. 3. Develop healthy interpersonal relationships that lead to the alleviation and help prevent the relapse of depression. 4. Develop healthy thinking patterns and beliefs about self, others, and the world that lead to the alleviation and help prevent the relapse of depression. 5. Recognize, accept, and cope with feelings of depression. Diagnosis F33.1  Conditions For Discharge Achievement of treatment goals and objectives   Clint Bolder, LCSW

## 2023-02-27 ENCOUNTER — Other Ambulatory Visit (HOSPITAL_BASED_OUTPATIENT_CLINIC_OR_DEPARTMENT_OTHER): Payer: Self-pay

## 2023-02-27 ENCOUNTER — Other Ambulatory Visit: Payer: Self-pay | Admitting: Family

## 2023-02-27 ENCOUNTER — Other Ambulatory Visit: Payer: Self-pay

## 2023-02-27 ENCOUNTER — Encounter: Payer: Self-pay | Admitting: Family

## 2023-02-27 DIAGNOSIS — F988 Other specified behavioral and emotional disorders with onset usually occurring in childhood and adolescence: Secondary | ICD-10-CM

## 2023-02-27 DIAGNOSIS — Z1211 Encounter for screening for malignant neoplasm of colon: Secondary | ICD-10-CM

## 2023-02-27 MED ORDER — DEXMETHYLPHENIDATE HCL 5 MG PO TABS
5.0000 mg | ORAL_TABLET | Freq: Every day | ORAL | 0 refills | Status: DC
Start: 2023-02-27 — End: 2023-03-25

## 2023-02-27 MED ORDER — BUPROPION HCL ER (XL) 150 MG PO TB24: 150.0000 mg | ORAL_TABLET | Freq: Every day | ORAL | 1 refills | Status: DC

## 2023-02-27 MED ORDER — LEVOTHYROXINE SODIUM 25 MCG PO TABS: 25.0000 ug | ORAL_TABLET | Freq: Every day | ORAL | 1 refills | Status: DC

## 2023-02-27 MED ORDER — DEXMETHYLPHENIDATE HCL ER 25 MG PO CP24: 1.0000 | ORAL_CAPSULE | Freq: Every day | ORAL | 0 refills | Status: DC

## 2023-03-02 ENCOUNTER — Telehealth (INDEPENDENT_AMBULATORY_CARE_PROVIDER_SITE_OTHER): Payer: PRIVATE HEALTH INSURANCE | Admitting: Family

## 2023-03-02 ENCOUNTER — Telehealth: Payer: PRIVATE HEALTH INSURANCE | Admitting: Family

## 2023-03-02 ENCOUNTER — Telehealth: Payer: Self-pay | Admitting: Family

## 2023-03-02 VITALS — Wt 180.0 lb

## 2023-03-02 DIAGNOSIS — R918 Other nonspecific abnormal finding of lung field: Secondary | ICD-10-CM | POA: Diagnosis not present

## 2023-03-02 DIAGNOSIS — M2550 Pain in unspecified joint: Secondary | ICD-10-CM | POA: Diagnosis not present

## 2023-03-02 DIAGNOSIS — R635 Abnormal weight gain: Secondary | ICD-10-CM | POA: Diagnosis not present

## 2023-03-02 DIAGNOSIS — F32A Depression, unspecified: Secondary | ICD-10-CM | POA: Diagnosis not present

## 2023-03-03 ENCOUNTER — Other Ambulatory Visit (HOSPITAL_BASED_OUTPATIENT_CLINIC_OR_DEPARTMENT_OTHER): Payer: Self-pay

## 2023-03-03 DIAGNOSIS — M2550 Pain in unspecified joint: Secondary | ICD-10-CM | POA: Insufficient documentation

## 2023-03-03 MED ORDER — ZEPBOUND 2.5 MG/0.5ML ~~LOC~~ SOAJ
2.5000 mg | SUBCUTANEOUS | 0 refills | Status: DC
  Filled 2023-03-03: qty 2, 28d supply, fill #0

## 2023-03-03 NOTE — Assessment & Plan Note (Addendum)
Wt Readings from Last 3 Encounters:  03/03/23 180 lb (81.6 kg)  12/31/22 177 lb (80.3 kg)  09/19/22 175 lb (79.4 kg)   TSH was normal back in April.  Will update TFT's.  She would like trial of zepbound.  Will see if we can get her insurance to pay for it.

## 2023-03-03 NOTE — Telephone Encounter (Signed)
PA approved. Effective 03/03/2023 - 09/03/2023

## 2023-03-03 NOTE — Telephone Encounter (Signed)
PA initiated via Covermymeds; KEY: [id number]. Awaiting determination.

## 2023-03-03 NOTE — Progress Notes (Signed)
Virtual telephone visit    Virtual Visit via Telephone Note      Patient location: Home. Patient and provider in visit Provider location: Office  I discussed the limitations of evaluation and management by telemedicine and the availability of in person appointments. The patient expressed understanding and agreed to proceed.   Visit Date: 03/02/2023  Today's healthcare provider: Lemont Fillers, NP     Subjective:    Patient ID: [name], female    DOB: 1971-07-18, 52 y.o.   MRN: [id number]  Chief Complaint  Patient presents with   Weight Management Screening    Will like to discuss weight management    HPI  Patient is a 52 yr old female who presents today to discuss several concerns.  Her primary concern is weight gain.  She states that her weight is up to 180 lb.  She wonders if this is related to her Hashimoto's. She also reports joint pain and low mood.  She is requesting hormonal studies as well.   Past Medical History:  Diagnosis Date   ADD (attention deficit disorder)    Adjustment disorder    without depression   Allergy    allergic rhinitis   Asthma    hx of exercised induced   Depression    Endometriosis    Former smoker    SVD (spontaneous vaginal delivery)    x 1   Thyroid disease    hyperthyroidism / Graves Disease - no treatment needed for years per patient   Unilateral primary osteoarthritis, right hip 07/17/2020    Past Surgical History:  Procedure Laterality Date   AUGMENTATION MAMMAPLASTY     BREAST BIOPSY Bilateral 2016   BREAST SURGERY  2004   augmentation   DILITATION & CURRETTAGE/HYSTROSCOPY WITH HYDROTHERMAL ABLATION N/A 12/21/2014   Procedure: DILATATION & CURETTAGE/HYSTEROSCOPY WITH HYDROTHERMAL ABLATION;  Surgeon: Marcelle Overlie, MD;  Location: [facility];  Service: Gynecology;  Laterality: N/A;   LAPAROSCOPIC TUBAL LIGATION Bilateral 12/21/2014   Procedure: LAPAROSCOPIC TUBAL LIGATION;  Surgeon: Marcelle Overlie, MD;   Location: [facility];  Service: Gynecology;  Laterality: Bilateral;   right ankle surgery  2002   TOTAL HIP ARTHROPLASTY Right 09/21/2020   Procedure: RIGHT TOTAL HIP ARTHROPLASTY ANTERIOR APPROACH;  Surgeon: Kathryne Hitch, MD;  Location: [facility];  Service: Orthopedics;  Laterality: Right;   TUBAL LIGATION  12/21/14   WISDOM TOOTH EXTRACTION      Family History  Problem Relation Age of Onset   Diabetes Other    Stroke Other    Hypertension Other    Thyroid disease Other    Migraines Other    Breast cancer Maternal Aunt     Social History   Socioeconomic History   Marital status: Single    Spouse name: Not on file   Number of children: 1   Years of education: Not on file   Highest education level: Bachelor's degree (e.g., BA, AB, BS)  Occupational History   Occupation: Teacher, adult education: [employer]  Tobacco Use   Smoking status: Former    Current packs/day: 0.00    Average packs/day: 0.5 packs/day for 15.0 years (7.5 ttl pk-yrs)    Types: Cigarettes    Start date: 07/18/1996    Quit date: 07/19/2011    Years since quitting: 11.6   Smokeless tobacco: Never  Vaping Use   Vaping status: Never Used  Substance and Sexual Activity   Alcohol use: Not Currently  Comment: socially   Drug use: No   Sexual activity: Yes    Birth control/protection: Surgical    Comment: on loestrin   Other Topics Concern   Not on file  Social History Narrative   Works as an Human resources officer Strain: Low Risk  (02/28/2023)   Overall Financial Resource Strain (CARDIA)    Difficulty of Paying Living Expenses: Not hard at all  Food Insecurity: No Food Insecurity (02/28/2023)   Hunger Vital Sign    Worried About Running Out of Food in the Last Year: Never true    Ran Out of Food in the Last Year: Never true  Transportation Needs: No Transportation Needs (02/28/2023)   PRAPARE - Administrator, Civil Service (Medical): No    Lack of  Transportation (Non-Medical): No  Physical Activity: Sufficiently Active (02/28/2023)   Exercise Vital Sign    Days of Exercise per Week: 4 days    Minutes of Exercise per Session: 60 min  Stress: No Stress Concern Present (02/28/2023)   [employer] of Occupational Health - Occupational Stress Questionnaire    Feeling of Stress : Not at all  Social Connections: Socially Isolated (02/28/2023)   Social Connection and Isolation Panel [NHANES]    Frequency of Communication with Friends and Family: More than three times a week    Frequency of Social Gatherings with Friends and Family: Once a week    Attends Religious Services: Never    Database administrator or Organizations: No    Attends Engineer, structural: Not on file    Marital Status: Never married  Intimate Partner Violence: Not on file    Outpatient Medications Prior to Visit  Medication Sig Dispense Refill   buPROPion (WELLBUTRIN XL) 150 MG 24 hr tablet Take 1 tablet (150 mg total) by mouth daily. 90 tablet 1   dexmethylphenidate (FOCALIN) 5 MG tablet Take 1 tablet (5 mg total) by mouth daily in the afternoon. 30 tablet 0   Dexmethylphenidate HCl 25 MG CP24 Take 1 capsule (25 mg total) by mouth daily at 6 (six) AM. 30 capsule 0   DHEA 50 MG CAPS Take 50 mg by mouth daily.     fexofenadine (ALLEGRA) 180 MG tablet Take 180 mg by mouth daily as needed for allergies.     fluticasone (FLONASE) 50 MCG/ACT nasal spray Place 2 sprays into both nostrils daily. 16 g 0   levothyroxine (SYNTHROID) 25 MCG tablet Take 1 tablet (25 mcg total) by mouth daily. 90 tablet 1   Multiple Vitamins-Minerals (MULTIVITAMIN WITH MINERALS) tablet Take 1 tablet by mouth daily.     nitrofurantoin, macrocrystal-monohydrate, (MACROBID) 100 MG capsule Take 1 capsule (100 mg total) by mouth as needed. 30 capsule 3   Probiotic Product (PROBIOTIC DAILY PO) Take 1 capsule by mouth daily.     amoxicillin-clavulanate (AUGMENTIN) 875-125 MG tablet Take 1  tablet by mouth 2 (two) times daily. 20 tablet 0   No facility-administered medications prior to visit.    Allergies  Allergen Reactions   Escitalopram Oxalate Other (See Comments)    Causes tremors.   Montelukast Sodium Swelling    Causes swelling and pain in the joints.   Paroxetine Hcl     Too high of a dose makes depressed    Wellbutrin [Bupropion] Other (See Comments)    Did well on a low dose but higher dose caused depression.    ROS  See HPI Objective:    Physical Exam Neurological:     Mental Status: She is alert and oriented to person, place, and time.  Psychiatric:        Attention and Perception: Attention normal.        Mood and Affect: Affect is tearful.        Speech: Speech normal.        Behavior: Behavior normal.        Thought Content: Thought content normal.        Cognition and Memory: Cognition normal.        Judgment: Judgment normal.     Wt 180 lb (81.6 kg)   BMI 29.05 kg/m  Wt Readings from Last 3 Encounters:  03/03/23 180 lb (81.6 kg)  12/31/22 177 lb (80.3 kg)  09/19/22 175 lb (79.4 kg)        Assessment & Plan:   Problem List Items Addressed This Visit       Unprioritized   Weight gain - Primary    Wt Readings from Last 3 Encounters:  03/03/23 180 lb (81.6 kg)  12/31/22 177 lb (80.3 kg)  09/19/22 175 lb (79.4 kg)   TSH was normal back in April.  Will update TFT's.  She would like trial of zepbound.  Will see if we can get her insurance to pay for it.       Relevant Orders   TSH   Pulmonary nodules/lesions, multiple    Follow up CT 3/24 noted the following:    IMPRESSION: The previously seen 5 mm ground-glass nodule in the right lower lobe and 9 mm solid nodule in the left lower lobe are unchanged in size compatible with benign etiology. No additional follow-up is recommended.   New 5 mm ground-glass nodule in the right upper lobe. No follow-up recommended. This recommendation follows the consensus  statement: Guidelines for Management of Incidental Pulmonary Nodules Detected on CT Images: From the Fleischner Society 2017; Radiology 2017; 284:228-243.        Depression    Uncontrolled. She has had some tremors on SSRI's in the past at higher doses. She has not tried lexapro.  Will initiate lexapro at 5mg  once daily.       Arthralgia    New.  Will obtain labs as below for further evaluation.       Relevant Orders   Antinuclear Antib (ANA)   Estradiol   FSH   Rheumatoid Factor   Sedimentation rate   Testosterone    I have discontinued Verlon Au P. Dieguez's amoxicillin-clavulanate. I am also having her start on Zepbound. Additionally, I am having her maintain her fexofenadine, multivitamin with minerals, DHEA, Probiotic Product (PROBIOTIC DAILY PO), fluticasone, nitrofurantoin (macrocrystal-monohydrate), levothyroxine, dexmethylphenidate, buPROPion, and Dexmethylphenidate HCl.  Meds ordered this encounter  Medications   tirzepatide (ZEPBOUND) 2.5 MG/0.5ML Pen    Sig: Inject 2.5 mg into the skin once a week.    Dispense:  2 mL    Refill:  0    Order Specific Question:   Supervising Provider    Answer:   Danise Edge A [[id number]]   I discussed the assessment and treatment plan with the patient. The patient was provided an opportunity to ask questions and all were answered. The patient agreed with the plan and demonstrated an understanding of the instructions.   The patient was advised to call back or seek an in-person evaluation if the symptoms worsen or if the condition fails to improve as anticipated.  I  provided 20 minutes of non-face-to-face time during this encounter.   Lemont Fillers, NP [facility] [phone] (phone) [phone] (fax)  [facility]

## 2023-03-03 NOTE — Assessment & Plan Note (Signed)
Follow up CT 3/24 noted the following:    IMPRESSION: The previously seen 5 mm ground-glass nodule in the right lower lobe and 9 mm solid nodule in the left lower lobe are unchanged in size compatible with benign etiology. No additional follow-up is recommended.   New 5 mm ground-glass nodule in the right upper lobe. No follow-up recommended. This recommendation follows the consensus statement: Guidelines for Management of Incidental Pulmonary Nodules Detected on CT Images: From the Fleischner Society 2017; Radiology 2017; 284:228-243.

## 2023-03-03 NOTE — Assessment & Plan Note (Signed)
>>  ASSESSMENT AND PLAN FOR DEPRESSION WRITTEN ON 03/03/2023  7:19 AM BY O'SULLIVAN, Dava Rensch, NP  Uncontrolled. She has had some tremors on SSRI's in the past at higher doses. She has not tried lexapro .  Will initiate lexapro  at 5mg  once daily.

## 2023-03-03 NOTE — Assessment & Plan Note (Signed)
Uncontrolled. She has had some tremors on SSRI's in the past at higher doses. She has not tried lexapro.  Will initiate lexapro at 5mg  once daily.

## 2023-03-03 NOTE — Assessment & Plan Note (Signed)
New. Will obtain labs as below for further evaluation.  ?

## 2023-03-04 ENCOUNTER — Encounter: Payer: Self-pay | Admitting: Family

## 2023-03-04 ENCOUNTER — Other Ambulatory Visit (HOSPITAL_BASED_OUTPATIENT_CLINIC_OR_DEPARTMENT_OTHER): Payer: Self-pay

## 2023-03-04 DIAGNOSIS — R635 Abnormal weight gain: Secondary | ICD-10-CM

## 2023-03-04 MED ORDER — ESCITALOPRAM OXALATE 5 MG PO TABS: 5.0000 mg | ORAL_TABLET | Freq: Every day | ORAL | 0 refills | Status: DC

## 2023-03-04 MED ORDER — ZEPBOUND 2.5 MG/0.5ML ~~LOC~~ SOAJ: 2.5000 mg | SUBCUTANEOUS | 0 refills | Status: DC

## 2023-03-04 MED ORDER — ESCITALOPRAM OXALATE 5 MG PO TABS
5.0000 mg | ORAL_TABLET | Freq: Every day | ORAL | 0 refills | Status: DC
  Filled 2023-03-04: qty 90, 90d supply, fill #0

## 2023-03-04 NOTE — Addendum Note (Signed)
Addended by: Sandford Craze on: 03/04/2023 07:44 AM   Modules accepted: Orders

## 2023-03-09 ENCOUNTER — Other Ambulatory Visit: Payer: PRIVATE HEALTH INSURANCE

## 2023-03-11 ENCOUNTER — Other Ambulatory Visit (INDEPENDENT_AMBULATORY_CARE_PROVIDER_SITE_OTHER): Payer: PRIVATE HEALTH INSURANCE

## 2023-03-11 DIAGNOSIS — R635 Abnormal weight gain: Secondary | ICD-10-CM | POA: Diagnosis not present

## 2023-03-11 DIAGNOSIS — M2550 Pain in unspecified joint: Secondary | ICD-10-CM | POA: Diagnosis not present

## 2023-03-11 LAB — CBC WITH DIFFERENTIAL/PLATELET
Basophils Absolute: 0 10*3/uL (ref 0.0–0.1)
Basophils Relative: 1.4 % (ref 0.0–3.0)
Eosinophils Absolute: 0.1 10*3/uL (ref 0.0–0.7)
Eosinophils Relative: 3.3 % (ref 0.0–5.0)
HCT: 41.2 % (ref 36.0–46.0)
Hemoglobin: 13.5 g/dL (ref 12.0–15.0)
Lymphocytes Relative: 44.2 % (ref 12.0–46.0)
Lymphs Abs: 1.1 10*3/uL (ref 0.7–4.0)
MCHC: 32.7 g/dL (ref 30.0–36.0)
MCV: 93.9 fl (ref 78.0–100.0)
Monocytes Absolute: 0.2 10*3/uL (ref 0.1–1.0)
Monocytes Relative: 9.1 % (ref 3.0–12.0)
Neutro Abs: 1 10*3/uL — ABNORMAL LOW (ref 1.4–7.7)
Neutrophils Relative %: 42 % — ABNORMAL LOW (ref 43.0–77.0)
Platelets: 194 10*3/uL (ref 150.0–400.0)
RBC: 4.39 Mil/uL (ref 3.87–5.11)
RDW: 12.6 % (ref 11.5–15.5)
WBC: 2.5 10*3/uL — ABNORMAL LOW (ref 4.0–10.5)

## 2023-03-11 LAB — SEDIMENTATION RATE: Sed Rate: 6 mm/hr (ref 0–30)

## 2023-03-11 LAB — TSH: TSH: 3.1 u[IU]/mL (ref 0.35–5.50)

## 2023-03-11 LAB — FOLLICLE STIMULATING HORMONE: FSH: 100.4 m[IU]/mL

## 2023-03-11 LAB — TESTOSTERONE: Testosterone: 72.7 ng/dL — ABNORMAL HIGH (ref 15.00–40.00)

## 2023-03-12 ENCOUNTER — Encounter: Payer: Self-pay | Admitting: Family

## 2023-03-12 ENCOUNTER — Other Ambulatory Visit: Payer: Self-pay | Admitting: Family

## 2023-03-12 DIAGNOSIS — D72819 Decreased white blood cell count, unspecified: Secondary | ICD-10-CM

## 2023-03-12 LAB — ANA: Anti Nuclear Antibody (ANA): NEGATIVE

## 2023-03-12 LAB — ESTRADIOL: Estradiol: <15 pg/mL

## 2023-03-19 ENCOUNTER — Ambulatory Visit: Payer: PRIVATE HEALTH INSURANCE | Admitting: Psychology

## 2023-03-19 DIAGNOSIS — F331 Major depressive disorder, recurrent, moderate: Secondary | ICD-10-CM | POA: Diagnosis not present

## 2023-03-19 NOTE — Progress Notes (Signed)
[facility] Counselor/Therapist Progress Note  Patient ID: CHRISETTE FILION, MRN: [id number],    Date: 03/19/2023  Time Spent: 5:00pm-5:55pm   55 minutes   Treatment Type: Individual Therapy  Reported Symptoms: stress  Mental Status Exam: Appearance:  Casual     Behavior: Appropriate  Motor: Normal  Speech/Language:  Normal Rate  Affect: Appropriate  Mood: normal  Thought process: normal  Thought content:   WNL  Sensory/Perceptual disturbances:   WNL  Orientation: oriented to person, place, time/date, and situation  Attention: Good  Concentration: Good  Memory: WNL  Fund of knowledge:  Good  Insight:   Good  Judgment:  Good  Impulse Control: Good   Risk Assessment: Danger to Self:  No Self-injurious Behavior: No Danger to Others: No Duty to Warn:no Physical Aggression / Violence:No  Access to Firearms a concern: No  Gang Involvement:No   Subjective: Pt present for face-to-face individual therapy via video.  Pt consents to telehealth video session and is aware of limitations of video sessions. Location of pt: home Location of therapist: home office.   Pt talked about her health.  She has to see an oncologist bc her white blood cell count keeps trending down and is low. Pt sees the oncologist August 19th.   Pt has not been feeling well and mostly sleeps on her days off. Pt talked about work.  She has been working a lot of hours but after her family vacation next week she plans to cut back on her work schedule.  Pt has had some periods of feeling depressed.  She worries about her health issues and gets down about not feeling well enough to do things.   She is trying to cope with positive self talk.  Worked on additional coping strategies.  Encouraged pt to increase self care. Provided supportive therapy.    Interventions: Cognitive Behavioral Therapy and Insight-Oriented  Diagnosis: F33.1  Plan of Care: Recommend ongoing therapy.   Pt participated in  setting treatment goals.   Plan to meet monthly.    Treatment Plan Client Abilities/Strengths  Pt is bright, engaging, and motivated for therapy.   Client Treatment Preferences  Individual therapy.  Client Statement of Needs  Improve coping skills.  Symptoms  Depressed or irritable mood.  Problems Addressed  Unipolar Depression Goals 1. Alleviate depressive symptoms and return to previous level of effective functioning. 2. Appropriately grieve the loss in order to normalize mood and to return to previously adaptive level of functioning. Objective Learn and implement behavioral strategies to overcome depression. Target Date: 2023-06-14 Frequency: Monthly  Progress: 50 Modality: individual  Related Interventions Engage the client in "behavioral activation," increasing his/her activity level and contact with sources of reward, while identifying processes that inhibit activation.  Use behavioral techniques such as instruction, rehearsal, role-playing, role reversal, as needed, to facilitate activity in the client's daily life; reinforce success. Assist the client in developing skills that increase the likelihood of deriving pleasure from behavioral activation (e.g., assertiveness skills, developing an exercise plan, less internal/more external focus, increased social involvement); reinforce success. Objective Identify important people in life, past and present, and describe the quality, good and poor, of those relationships. Target Date: 2023-06-14 Frequency: Monthly  Progress: 50 Modality: individual  Related Interventions Conduct Interpersonal Therapy beginning with the assessment of the client's "interpersonal inventory" of important past and present relationships; develop a case formulation linking depression to grief, interpersonal role disputes, role transitions, and/or interpersonal deficits). Objective Learn and implement problem-solving and decision-making skills.  Target Date:  2023-06-14 Frequency: Monthly  Progress: 50 Modality: individual  Related Interventions Conduct Problem-Solving Therapy using techniques such as psychoeducation, modeling, and role-playing to teach client problem-solving skills (i.e., defining a problem specifically, generating possible solutions, evaluating the pros and cons of each solution, selecting and implementing a plan of action, evaluating the efficacy of the plan, accepting or revising the plan); role-play application of the problem-solving skill to a real life issue. Encourage in the client the development of a positive problem orientation in which problems and solving them are viewed as a natural part of life and not something to be feared, despaired, or avoided. 3. Develop healthy interpersonal relationships that lead to the alleviation and help prevent the relapse of depression. 4. Develop healthy thinking patterns and beliefs about self, others, and the world that lead to the alleviation and help prevent the relapse of depression. 5. Recognize, accept, and cope with feelings of depression. Diagnosis F33.1  Conditions For Discharge Achievement of treatment goals and objectives   Salomon Fick, LCSW

## 2023-03-25 ENCOUNTER — Other Ambulatory Visit: Payer: Self-pay | Admitting: Family

## 2023-03-25 DIAGNOSIS — F988 Other specified behavioral and emotional disorders with onset usually occurring in childhood and adolescence: Secondary | ICD-10-CM

## 2023-03-26 ENCOUNTER — Encounter: Payer: Self-pay | Admitting: Family

## 2023-03-26 MED ORDER — DEXMETHYLPHENIDATE HCL 5 MG PO TABS
5.0000 mg | ORAL_TABLET | Freq: Every day | ORAL | 0 refills | Status: DC
Start: 2023-03-26 — End: 2023-05-12

## 2023-03-26 MED ORDER — ZEPBOUND 2.5 MG/0.5ML ~~LOC~~ SOAJ: 2.5000 mg | SUBCUTANEOUS | 0 refills | Status: DC

## 2023-03-26 MED ORDER — DEXMETHYLPHENIDATE HCL ER 25 MG PO CP24: 1.0000 | ORAL_CAPSULE | Freq: Every day | ORAL | 0 refills | Status: DC

## 2023-04-06 ENCOUNTER — Other Ambulatory Visit: Payer: Self-pay | Admitting: Family

## 2023-04-06 ENCOUNTER — Inpatient Hospital Stay: Payer: PRIVATE HEALTH INSURANCE | Attending: Medical Oncology

## 2023-04-06 ENCOUNTER — Other Ambulatory Visit: Payer: Self-pay

## 2023-04-06 ENCOUNTER — Inpatient Hospital Stay: Payer: PRIVATE HEALTH INSURANCE | Admitting: Medical Oncology

## 2023-04-06 ENCOUNTER — Encounter: Payer: Self-pay | Admitting: Medical Oncology

## 2023-04-06 VITALS — BP 107/76 | HR 56 | Temp 97.0°F | Resp 19 | Ht 66.0 in | Wt 176.1 lb

## 2023-04-06 DIAGNOSIS — E063 Autoimmune thyroiditis: Secondary | ICD-10-CM

## 2023-04-06 DIAGNOSIS — Z8616 Personal history of COVID-19: Secondary | ICD-10-CM | POA: Diagnosis not present

## 2023-04-06 DIAGNOSIS — Z803 Family history of malignant neoplasm of breast: Secondary | ICD-10-CM | POA: Diagnosis not present

## 2023-04-06 DIAGNOSIS — D709 Neutropenia, unspecified: Secondary | ICD-10-CM | POA: Insufficient documentation

## 2023-04-06 DIAGNOSIS — Z87891 Personal history of nicotine dependence: Secondary | ICD-10-CM | POA: Insufficient documentation

## 2023-04-06 DIAGNOSIS — Z79899 Other long term (current) drug therapy: Secondary | ICD-10-CM | POA: Insufficient documentation

## 2023-04-06 DIAGNOSIS — F988 Other specified behavioral and emotional disorders with onset usually occurring in childhood and adolescence: Secondary | ICD-10-CM

## 2023-04-06 LAB — CBC WITH DIFFERENTIAL (CANCER CENTER ONLY)
Abs Immature Granulocytes: 0 10*3/uL (ref 0.00–0.07)
Basophils Absolute: 0 10*3/uL (ref 0.0–0.1)
Basophils Relative: 1 %
Eosinophils Absolute: 0.1 10*3/uL (ref 0.0–0.5)
Eosinophils Relative: 2 %
HCT: 38 % (ref 36.0–46.0)
Hemoglobin: 12.6 g/dL (ref 12.0–15.0)
Immature Granulocytes: 0 %
Lymphocytes Relative: 39 %
Lymphs Abs: 1.2 10*3/uL (ref 0.7–4.0)
MCH: 31 pg (ref 26.0–34.0)
MCHC: 33.2 g/dL (ref 30.0–36.0)
MCV: 93.4 fL (ref 80.0–100.0)
Monocytes Absolute: 0.2 10*3/uL (ref 0.1–1.0)
Monocytes Relative: 6 %
Neutro Abs: 1.5 10*3/uL — ABNORMAL LOW (ref 1.7–7.7)
Neutrophils Relative %: 52 %
Platelet Count: 181 10*3/uL (ref 150–400)
RBC: 4.07 MIL/uL (ref 3.87–5.11)
RDW: 11.9 % (ref 11.5–15.5)
WBC Count: 3 10*3/uL — ABNORMAL LOW (ref 4.0–10.5)
nRBC: 0 % (ref 0.0–0.2)

## 2023-04-06 LAB — CMP (CANCER CENTER ONLY)
ALT: 10 U/L (ref 0–44)
AST: 18 U/L (ref 15–41)
Albumin: 4.8 g/dL (ref 3.5–5.0)
Alkaline Phosphatase: 55 U/L (ref 38–126)
Anion gap: 7 (ref 5–15)
BUN: 17 mg/dL (ref 6–20)
CO2: 29 mmol/L (ref 22–32)
Calcium: 9.4 mg/dL (ref 8.9–10.3)
Chloride: 104 mmol/L (ref 98–111)
Creatinine: 0.92 mg/dL (ref 0.44–1.00)
GFR, Estimated: >60 mL/min (ref >60)
Glucose, Bld: 87 mg/dL (ref 70–99)
Potassium: 3.7 mmol/L (ref 3.5–5.1)
Sodium: 140 mmol/L (ref 135–145)
Total Bilirubin: 0.4 mg/dL (ref 0.3–1.2)
Total Protein: 7.5 g/dL (ref 6.5–8.1)

## 2023-04-06 LAB — HEPATITIS C ANTIBODY: HCV Ab: NONREACTIVE

## 2023-04-06 LAB — VITAMIN B12: Vitamin B-12: 505 pg/mL (ref 180–914)

## 2023-04-06 LAB — HEPATITIS B SURFACE ANTIGEN: Hepatitis B Surface Ag: NONREACTIVE

## 2023-04-06 LAB — HEPATITIS B CORE ANTIBODY, TOTAL: Hep B Core Total Ab: NONREACTIVE

## 2023-04-06 LAB — FERRITIN: Ferritin: 59 ng/mL (ref 11–307)

## 2023-04-06 LAB — SEDIMENTATION RATE: Sed Rate: 10 mm/hr (ref 0–22)

## 2023-04-06 LAB — FOLATE: Folate: 19.2 ng/mL (ref >5.9)

## 2023-04-06 LAB — C-REACTIVE PROTEIN: CRP: 0.7 mg/dL (ref <1.0)

## 2023-04-06 NOTE — Progress Notes (Signed)
[facility] [phone]   [phone]  INITIAL CONSULT NOTE  Patient Care Team: Sandford Craze, NP as PCP - General (Internal Medicine)  CHIEF COMPLAINTS/PURPOSE OF CONSULTATION:  Leukopenia   HISTORY OF PRESENTING ILLNESS:  [name] 52 y.o. female is referred to our office by Sandford Craze NP for Leukopenia:   She is a Engineer, civil (consulting).   She reports that her neutropenia started about 1 year ago. Has had COVID-19 a few times prior to this other than this no significant illness. She did have one surgery but this was after her labs started to be abnormal.   She has been without red meats since 2014 after a tick bite. She does report that she started a supplement that was supposed to help with menopause that had many gel capsules. Things may have been worse after this. Then she was diagnosed with Hashimoto's this year. She has struggled a bit with the many symptoms of this condition including fatigue, brain fog, recurrent sinus infections, etc.   Unintentional Weight loss. No night sweats but has hot flashes from menopause.No family or personal history of blood based cancers.    MEDICAL HISTORY:  Past Medical History:  Diagnosis Date   ADD (attention deficit disorder)    Adjustment disorder    without depression   Allergy    allergic rhinitis   Asthma    hx of exercised induced   Depression    Endometriosis    Former smoker    SVD (spontaneous vaginal delivery)    x 1   Thyroid disease    hyperthyroidism / Graves Disease - no treatment needed for years per patient   Unilateral primary osteoarthritis, right hip 07/17/2020    SURGICAL HISTORY: Past Surgical History:  Procedure Laterality Date   AUGMENTATION MAMMAPLASTY     BREAST BIOPSY Bilateral 2016   BREAST SURGERY  2004   augmentation   DILITATION & CURRETTAGE/HYSTROSCOPY WITH HYDROTHERMAL ABLATION N/A 12/21/2014   Procedure: DILATATION & CURETTAGE/HYSTEROSCOPY WITH  HYDROTHERMAL ABLATION;  Surgeon: Marcelle Overlie, MD;  Location: [facility];  Service: Gynecology;  Laterality: N/A;   LAPAROSCOPIC TUBAL LIGATION Bilateral 12/21/2014   Procedure: LAPAROSCOPIC TUBAL LIGATION;  Surgeon: Marcelle Overlie, MD;  Location: [facility];  Service: Gynecology;  Laterality: Bilateral;   right ankle surgery  2002   TOTAL HIP ARTHROPLASTY Right 09/21/2020   Procedure: RIGHT TOTAL HIP ARTHROPLASTY ANTERIOR APPROACH;  Surgeon: Kathryne Hitch, MD;  Location: [facility];  Service: Orthopedics;  Laterality: Right;   TUBAL LIGATION  12/21/14   WISDOM TOOTH EXTRACTION      SOCIAL HISTORY: Social History   Socioeconomic History   Marital status: Single    Spouse name: Not on file   Number of children: 1   Years of education: Not on file   Highest education level: Bachelor's degree (e.g., BA, AB, BS)  Occupational History   Occupation: Teacher, adult education: [employer]  Tobacco Use   Smoking status: Former    Current packs/day: 0.00    Average packs/day: 0.5 packs/day for 15.0 years (7.5 ttl pk-yrs)    Types: Cigarettes    Start date: 07/18/1996    Quit date: 07/19/2011    Years since quitting: 11.7   Smokeless tobacco: Never  Vaping Use   Vaping status: Never Used  Substance and Sexual Activity   Alcohol use: Not Currently    Comment: socially   Drug use: No   Sexual activity: Yes    Birth  control/protection: Surgical    Comment: on loestrin   Other Topics Concern   Not on file  Social History Narrative   Works as an Human resources officer Strain: Low Risk  (02/28/2023)   Overall Financial Resource Strain (CARDIA)    Difficulty of Paying Living Expenses: Not hard at all  Food Insecurity: No Food Insecurity (02/28/2023)   Hunger Vital Sign    Worried About Running Out of Food in the Last Year: Never true    Ran Out of Food in the Last Year: Never true  Transportation Needs: No Transportation Needs (02/28/2023)   PRAPARE -  Administrator, Civil Service (Medical): No    Lack of Transportation (Non-Medical): No  Physical Activity: Sufficiently Active (02/28/2023)   Exercise Vital Sign    Days of Exercise per Week: 4 days    Minutes of Exercise per Session: 60 min  Stress: No Stress Concern Present (02/28/2023)   [employer] of Occupational Health - Occupational Stress Questionnaire    Feeling of Stress : Not at all  Social Connections: Socially Isolated (02/28/2023)   Social Connection and Isolation Panel [NHANES]    Frequency of Communication with Friends and Family: More than three times a week    Frequency of Social Gatherings with Friends and Family: Once a week    Attends Religious Services: Never    Database administrator or Organizations: No    Attends Engineer, structural: Not on file    Marital Status: Never married  Intimate Partner Violence: Not At Risk (04/06/2023)   Humiliation, Afraid, Rape, and Kick questionnaire    Fear of Current or Ex-Partner: No    Emotionally Abused: No    Physically Abused: No    Sexually Abused: No    FAMILY HISTORY: Family History  Problem Relation Age of Onset   Diabetes Other    Stroke Other    Hypertension Other    Thyroid disease Other    Migraines Other    Breast cancer Maternal Aunt     ALLERGIES:  is allergic to montelukast sodium, paroxetine hcl, wellbutrin [bupropion], and escitalopram oxalate.  MEDICATIONS:  Current Outpatient Medications  Medication Sig Dispense Refill   buPROPion (WELLBUTRIN XL) 150 MG 24 hr tablet Take 1 tablet (150 mg total) by mouth daily. 90 tablet 1   dexmethylphenidate (FOCALIN) 5 MG tablet Take 1 tablet (5 mg total) by mouth daily in the afternoon. 30 tablet 0   Dexmethylphenidate HCl 25 MG CP24 Take 1 capsule (25 mg total) by mouth daily at 6 (six) AM. 30 capsule 0   fexofenadine (ALLEGRA) 180 MG tablet Take 180 mg by mouth daily as needed for allergies.     fluticasone (FLONASE) 50 MCG/ACT  nasal spray Place 2 sprays into both nostrils daily. 16 g 0   levothyroxine (SYNTHROID) 25 MCG tablet Take 1 tablet (25 mcg total) by mouth daily. 90 tablet 1   nitrofurantoin, macrocrystal-monohydrate, (MACROBID) 100 MG capsule Take 1 capsule (100 mg total) by mouth as needed. 30 capsule 3   Probiotic Product (PROBIOTIC DAILY PO) Take 1 capsule by mouth daily.     tirzepatide (ZEPBOUND) 2.5 MG/0.5ML Pen Inject 2.5 mg into the skin once a week. 2 mL 0   No current facility-administered medications for this visit.    REVIEW OF SYSTEMS:   Constitutional: ( - ) fevers, ( - )  chills , ( - ) night sweats Eyes: ( - )  blurriness of vision, ( - ) double vision, ( - ) watery eyes Ears, nose, mouth, throat, and face: ( - ) mucositis, ( - ) sore throat Respiratory: ( - ) cough, ( - ) dyspnea, ( - ) wheezes Cardiovascular: ( - ) palpitation, ( - ) chest discomfort, ( - ) lower extremity swelling Gastrointestinal:  ( - ) nausea, ( - ) heartburn, ( - ) change in bowel habits Skin: ( - ) abnormal skin rashes Lymphatics: ( - ) new lymphadenopathy, ( - ) easy bruising Neurological: ( - ) numbness, ( - ) tingling, ( - ) new weaknesses Behavioral/Psych: ( - ) mood change, ( - ) new changes  All other systems were reviewed with the patient and are negative.  PHYSICAL EXAMINATION: ECOG PERFORMANCE STATUS: 1 - Symptomatic but completely ambulatory  Vitals:   04/06/23 1313  BP: 107/76  Pulse: (!) 56  Resp: 19  Temp: (!) 97 F (36.1 C)  SpO2: 98%   Filed Weights   04/06/23 1313  Weight: 176 lb 1.9 oz (79.9 kg)    GENERAL: well appearing female in NAD  SKIN: skin color, texture, turgor are normal, no rashes or significant lesions EYES: conjunctiva are pink and non-injected, sclera clear OROPHARYNX: no exudate, no erythema; lips, buccal mucosa, and tongue normal  NECK: supple, non-tender LYMPH:  no palpable lymphadenopathy in the cervical, axillary or supraclavicular lymph nodes.  LUNGS: clear to  auscultation and percussion with normal breathing effort HEART: regular rate & rhythm and no murmurs and no lower extremity edema ABDOMEN: soft, non-tender, non-distended, normal bowel sounds Musculoskeletal: no cyanosis of digits and no clubbing  PSYCH: alert & oriented x 3, fluent speech NEURO: no focal motor/sensory deficits  LABORATORY DATA:  I have reviewed the data as listed    Latest Ref Rng & Units 03/11/2023    9:03 AM 11/27/2022    8:12 AM 09/25/2022    9:47 AM  CBC  WBC 4.0 - 10.5 K/uL 2.5  2.7  3.1   Hemoglobin 12.0 - 15.0 g/dL 71.0  62.6  94.8   Hematocrit 36.0 - 46.0 % 41.2  40.7  41.9   Platelets 150.0 - 400.0 K/uL 194.0  192.0  193.0        Latest Ref Rng & Units 11/27/2022    8:12 AM 09/25/2022    9:47 AM 01/23/2022   10:19 AM  CMP  Glucose 70 - 99 mg/dL 56  55  93   BUN 6 - 23 mg/dL 19  24  31    Creatinine 0.40 - 1.20 mg/dL 5.46  2.70  3.50   Sodium 135 - 145 mEq/L 142  141  140   Potassium 3.5 - 5.1 mEq/L 4.2  3.9  4.5   Chloride 96 - 112 mEq/L 103  103  105   CO2 19 - 32 mEq/L 33  29  29   Calcium 8.4 - 10.5 mg/dL 9.9  9.5  9.8   Total Protein 6.0 - 8.3 g/dL 7.4  7.7  7.4   Total Bilirubin 0.2 - 1.2 mg/dL 0.6  0.5  0.4   Alkaline Phos 39 - 117 U/L 60  65  51   AST 0 - 37 U/L 24  18  22    ALT 0 - 35 U/L 19  14  19      ASSESSMENT & PLAN [name] is a 52 y.o. female who was referred to [place] for Neutropenia  I discussed with her that the differentials  for neutropenia include benign ethnic neutropenia, infectious etiology, nutritional etiology, inflammatory etiology, or medication.  The patient is not currently taking any medications known to cause neutropenia.  We will order viral studies with hep B, hep C, and HIV as well.  In addition, we will check for nutritional anemias with B12 and folate levels.    Assuming none of the studies show any concerning abnormalities, we will see the patient back in 6 months time to continue to monitor.      #  Leukopenia/Neutropenia  --Rule out infectious etiology with hepatitis B serologies, hepatitis C serologies, and HIV.  --Nutritional evaluation with  vitamin B12, MMA and folate.  --Inflammatory evaluation with ESR and CRP.   -- Given her past exposure to tick bite we will also check a Lyme panel --Repeat CBC and CMP.  -- Additionally, we will order peripheral blood film for next visit along with repeat CBC w/ diff, CMP.   Disposition Labs today RTC 1 month APP, labs  Orders Placed This Encounter  Procedures   Hepatitis C antibody    Standing Status:   Future    Standing Expiration Date:   04/05/2024   Hepatitis B surface antigen    Standing Status:   Future    Standing Expiration Date:   04/05/2024   Hepatitis B core antibody, total    Standing Status:   Future    Standing Expiration Date:   04/05/2024   Sedimentation rate    Standing Status:   Future    Standing Expiration Date:   04/05/2024   C-reactive protein    Standing Status:   Future    Standing Expiration Date:   04/05/2024   Ferritin    Standing Status:   Future    Standing Expiration Date:   04/05/2024   Iron and Iron Binding Capacity ([facility] only)    Standing Status:   Future    Standing Expiration Date:   04/05/2024   Folate    Standing Status:   Future    Standing Expiration Date:   04/05/2024   Vitamin B12    Standing Status:   Future    Standing Expiration Date:   04/05/2024   CMP (Cancer Center only)    Standing Status:   Future    Standing Expiration Date:   04/05/2024   CBC with Differential (Cancer Center Only)    Standing Status:   Future    Standing Expiration Date:   04/05/2024    All questions were answered. The patient knows to call the clinic with any problems, questions or concerns.  I have spent a total of 45 minutes minutes of face-to-face and non-face-to-face time, preparing to see the patient, obtaining and/or reviewing separately obtained history, performing a medically appropriate  examination, counseling and educating the patient, ordering medications/tests/procedures, referring and communicating with other health care professionals, documenting clinical information in the electronic health record, independently interpreting results and communicating results to the patient, and care coordination.    Clent Jacks PA-C Department of Hematology/Oncology [facility] at [facility]

## 2023-04-07 LAB — IRON AND IRON BINDING CAPACITY (CC-WL,HP ONLY)
Iron: 78 ug/dL (ref 28–170)
Saturation Ratios: 25 % (ref 10.4–31.8)
TIBC: 309 ug/dL (ref 250–450)
UIBC: 231 ug/dL (ref 148–442)

## 2023-04-07 LAB — LYME DISEASE SEROLOGY W/REFLEX: Lyme Total Antibody EIA: NEGATIVE

## 2023-04-16 ENCOUNTER — Ambulatory Visit: Payer: PRIVATE HEALTH INSURANCE | Admitting: Psychology

## 2023-04-16 DIAGNOSIS — F331 Major depressive disorder, recurrent, moderate: Secondary | ICD-10-CM | POA: Diagnosis not present

## 2023-04-16 NOTE — Progress Notes (Signed)
[facility] Counselor/Therapist Progress Note  Patient ID: ZEYNEP BUMPHUS, MRN: [id number],    Date: 04/16/2023  Time Spent: 9:00am-10:00am  60 minutes   Treatment Type: Individual Therapy  Reported Symptoms: stress  Mental Status Exam: Appearance:  Casual     Behavior: Appropriate  Motor: Normal  Speech/Language:  Normal Rate  Affect: Appropriate  Mood: normal  Thought process: normal  Thought content:   WNL  Sensory/Perceptual disturbances:   WNL  Orientation: oriented to person, place, time/date, and situation  Attention: Good  Concentration: Good  Memory: WNL  Fund of knowledge:  Good  Insight:   Good  Judgment:  Good  Impulse Control: Good   Risk Assessment: Danger to Self:  No Self-injurious Behavior: No Danger to Others: No Duty to Warn:no Physical Aggression / Violence:No  Access to Firearms a concern: No  Gang Involvement:No   Subjective: Pt present for face-to-face individual therapy via video.  Pt consents to telehealth video session and is aware of limitations and benefits of video sessions. Location of pt: home Location of therapist: home office.   Pt talked about her health.   She saw the oncologist and had blood work done and was reassured that she does not have cancer.   Pt is being referred to an endocronologist to manage her hashimottos disease.   Pt talked about going on a camping trip in [place] with friends.   The trip was stressful bc one of her friends was very controlling.   Addressed the friend drama.   Helped pt process her feelings and relationship dynamics.  Encouraged pt to increase self care. Provided supportive therapy.    Interventions: Cognitive Behavioral Therapy and Insight-Oriented  Diagnosis: F33.1  Plan of Care: Recommend ongoing therapy.   Pt participated in setting treatment goals.   Plan to meet monthly.    Treatment Plan Client Abilities/Strengths  Pt is bright, engaging, and motivated for therapy.    Client Treatment Preferences  Individual therapy.  Client Statement of Needs  Improve coping skills.  Symptoms  Depressed or irritable mood.  Problems Addressed  Unipolar Depression Goals 1. Alleviate depressive symptoms and return to previous level of effective functioning. 2. Appropriately grieve the loss in order to normalize mood and to return to previously adaptive level of functioning. Objective Learn and implement behavioral strategies to overcome depression. Target Date: 2023-06-14 Frequency: Monthly  Progress: 50 Modality: individual  Related Interventions Engage the client in "behavioral activation," increasing his/her activity level and contact with sources of reward, while identifying processes that inhibit activation.  Use behavioral techniques such as instruction, rehearsal, role-playing, role reversal, as needed, to facilitate activity in the client's daily life; reinforce success. Assist the client in developing skills that increase the likelihood of deriving pleasure from behavioral activation (e.g., assertiveness skills, developing an exercise plan, less internal/more external focus, increased social involvement); reinforce success. Objective Identify important people in life, past and present, and describe the quality, good and poor, of those relationships. Target Date: 2023-06-14 Frequency: Monthly  Progress: 50 Modality: individual  Related Interventions Conduct Interpersonal Therapy beginning with the assessment of the client's "interpersonal inventory" of important past and present relationships; develop a case formulation linking depression to grief, interpersonal role disputes, role transitions, and/or interpersonal deficits). Objective Learn and implement problem-solving and decision-making skills. Target Date: 2023-06-14 Frequency: Monthly  Progress: 50 Modality: individual  Related Interventions Conduct Problem-Solving Therapy using techniques such as  psychoeducation, modeling, and role-playing to teach client problem-solving skills (i.e., defining a problem  specifically, generating possible solutions, evaluating the pros and cons of each solution, selecting and implementing a plan of action, evaluating the efficacy of the plan, accepting or revising the plan); role-play application of the problem-solving skill to a real life issue. Encourage in the client the development of a positive problem orientation in which problems and solving them are viewed as a natural part of life and not something to be feared, despaired, or avoided. 3. Develop healthy interpersonal relationships that lead to the alleviation and help prevent the relapse of depression. 4. Develop healthy thinking patterns and beliefs about self, others, and the world that lead to the alleviation and help prevent the relapse of depression. 5. Recognize, accept, and cope with feelings of depression. Diagnosis F33.1  Conditions For Discharge Achievement of treatment goals and objectives   Salomon Fick, LCSW

## 2023-04-30 ENCOUNTER — Inpatient Hospital Stay: Payer: PRIVATE HEALTH INSURANCE | Admitting: Medical Oncology

## 2023-04-30 ENCOUNTER — Encounter: Payer: Self-pay | Admitting: Medical Oncology

## 2023-04-30 ENCOUNTER — Encounter: Payer: Self-pay | Admitting: Family

## 2023-04-30 ENCOUNTER — Other Ambulatory Visit: Payer: Self-pay

## 2023-04-30 ENCOUNTER — Inpatient Hospital Stay: Payer: PRIVATE HEALTH INSURANCE | Attending: Medical Oncology

## 2023-04-30 VITALS — BP 109/76 | HR 65 | Temp 98.4°F | Resp 18 | Ht 66.0 in | Wt 173.0 lb

## 2023-04-30 DIAGNOSIS — Z79899 Other long term (current) drug therapy: Secondary | ICD-10-CM | POA: Insufficient documentation

## 2023-04-30 DIAGNOSIS — D709 Neutropenia, unspecified: Secondary | ICD-10-CM | POA: Diagnosis not present

## 2023-04-30 DIAGNOSIS — Z87891 Personal history of nicotine dependence: Secondary | ICD-10-CM | POA: Insufficient documentation

## 2023-04-30 DIAGNOSIS — Z803 Family history of malignant neoplasm of breast: Secondary | ICD-10-CM | POA: Diagnosis not present

## 2023-04-30 DIAGNOSIS — Z1211 Encounter for screening for malignant neoplasm of colon: Secondary | ICD-10-CM

## 2023-04-30 LAB — CBC WITH DIFFERENTIAL (CANCER CENTER ONLY)
Abs Immature Granulocytes: 0 10*3/uL (ref 0.00–0.07)
Basophils Absolute: 0 10*3/uL (ref 0.0–0.1)
Basophils Relative: 1 %
Eosinophils Absolute: 0.1 10*3/uL (ref 0.0–0.5)
Eosinophils Relative: 3 %
HCT: 37.4 % (ref 36.0–46.0)
Hemoglobin: 12.4 g/dL (ref 12.0–15.0)
Immature Granulocytes: 0 %
Lymphocytes Relative: 45 %
Lymphs Abs: 1.3 10*3/uL (ref 0.7–4.0)
MCH: 31.1 pg (ref 26.0–34.0)
MCHC: 33.2 g/dL (ref 30.0–36.0)
MCV: 93.7 fL (ref 80.0–100.0)
Monocytes Absolute: 0.2 10*3/uL (ref 0.1–1.0)
Monocytes Relative: 7 %
Neutro Abs: 1.2 10*3/uL — ABNORMAL LOW (ref 1.7–7.7)
Neutrophils Relative %: 44 %
Platelet Count: 138 10*3/uL — ABNORMAL LOW (ref 150–400)
RBC: 3.99 MIL/uL (ref 3.87–5.11)
RDW: 12 % (ref 11.5–15.5)
WBC Count: 2.8 10*3/uL — ABNORMAL LOW (ref 4.0–10.5)
nRBC: 0 % (ref 0.0–0.2)

## 2023-04-30 LAB — CMP (CANCER CENTER ONLY)
ALT: 8 U/L (ref 0–44)
AST: 15 U/L (ref 15–41)
Albumin: 4.6 g/dL (ref 3.5–5.0)
Alkaline Phosphatase: 58 U/L (ref 38–126)
Anion gap: 6 (ref 5–15)
BUN: 13 mg/dL (ref 6–20)
CO2: 30 mmol/L (ref 22–32)
Calcium: 9.5 mg/dL (ref 8.9–10.3)
Chloride: 104 mmol/L (ref 98–111)
Creatinine: 1.13 mg/dL — ABNORMAL HIGH (ref 0.44–1.00)
GFR, Estimated: 59 mL/min — ABNORMAL LOW (ref >60)
Glucose, Bld: 103 mg/dL — ABNORMAL HIGH (ref 70–99)
Potassium: 4.4 mmol/L (ref 3.5–5.1)
Sodium: 140 mmol/L (ref 135–145)
Total Bilirubin: 0.7 mg/dL (ref 0.3–1.2)
Total Protein: 7.3 g/dL (ref 6.5–8.1)

## 2023-04-30 LAB — SAVE SMEAR(SSMR), FOR PROVIDER SLIDE REVIEW

## 2023-04-30 NOTE — Progress Notes (Signed)
[facility] [phone]   [phone]  Office Visit Follow Up Note  Patient Care Team: Sandford Craze, NP as PCP - General (Internal Medicine)  CHIEF COMPLAINTS/PURPOSE OF CONSULTATION:  Leukopenia   HISTORY OF PRESENTING ILLNESS:  [name] 52 y.o. female is referred to our office by Sandford Craze NP for Leukopenia:   She is a Engineer, civil (consulting).   She reports that her neutropenia started about 1 year ago. Has had COVID-19 a few times prior to this other than this no significant illness. She did have one surgery but this was after her labs started to be abnormal.   She has been without red meats since 2014 after a tick bite. She does report that she started a supplement that was supposed to help with menopause that had many gel capsules. Things may have been worse after this. Then she was diagnosed with Hashimoto's this year. She has struggled a bit with the many symptoms of this condition including fatigue, brain fog, recurrent sinus infections, etc.   Unintentional Weight loss. No night sweats but has hot flashes from menopause.No family or personal history of blood based cancers.   Interim History:  Today she reports that she has been feeling tired. She is about to start an autoimmune Paleo Diet and has stopped her Zepbound last week due to extreme loss of appetite. Improving slowly.  She denies any recent illness, fever, bleeding events.   Wt Readings from Last 3 Encounters:  04/30/23 173 lb (78.5 kg)  04/06/23 176 lb 1.9 oz (79.9 kg)  03/03/23 180 lb (81.6 kg)     MEDICAL HISTORY:  Past Medical History:  Diagnosis Date   ADD (attention deficit disorder)    Adjustment disorder    without depression   Allergy    allergic rhinitis   Asthma    hx of exercised induced   Depression    Endometriosis    Former smoker    SVD (spontaneous vaginal delivery)    x 1   Thyroid disease    hyperthyroidism / Graves Disease - no treatment  needed for years per patient   Unilateral primary osteoarthritis, right hip 07/17/2020    SURGICAL HISTORY: Past Surgical History:  Procedure Laterality Date   AUGMENTATION MAMMAPLASTY     BREAST BIOPSY Bilateral 2016   BREAST SURGERY  2004   augmentation   DILITATION & CURRETTAGE/HYSTROSCOPY WITH HYDROTHERMAL ABLATION N/A 12/21/2014   Procedure: DILATATION & CURETTAGE/HYSTEROSCOPY WITH HYDROTHERMAL ABLATION;  Surgeon: Marcelle Overlie, MD;  Location: [facility];  Service: Gynecology;  Laterality: N/A;   LAPAROSCOPIC TUBAL LIGATION Bilateral 12/21/2014   Procedure: LAPAROSCOPIC TUBAL LIGATION;  Surgeon: Marcelle Overlie, MD;  Location: [facility];  Service: Gynecology;  Laterality: Bilateral;   right ankle surgery  2002   TOTAL HIP ARTHROPLASTY Right 09/21/2020   Procedure: RIGHT TOTAL HIP ARTHROPLASTY ANTERIOR APPROACH;  Surgeon: Kathryne Hitch, MD;  Location: [facility];  Service: Orthopedics;  Laterality: Right;   TUBAL LIGATION  12/21/14   WISDOM TOOTH EXTRACTION      SOCIAL HISTORY: Social History   Socioeconomic History   Marital status: Single    Spouse name: Not on file   Number of children: 1   Years of education: Not on file   Highest education level: Bachelor's degree (e.g., BA, AB, BS)  Occupational History   Occupation: Teacher, adult education: [employer]  Tobacco Use   Smoking status: Former    Current packs/day: 0.00  Average packs/day: 0.5 packs/day for 15.0 years (7.5 ttl pk-yrs)    Types: Cigarettes    Start date: 07/18/1996    Quit date: 07/19/2011    Years since quitting: 11.7   Smokeless tobacco: Never  Vaping Use   Vaping status: Never Used  Substance and Sexual Activity   Alcohol use: Not Currently    Comment: socially   Drug use: No   Sexual activity: Yes    Birth control/protection: Surgical    Comment: on loestrin   Other Topics Concern   Not on file  Social History Narrative   Works as an Human resources officer  Strain: Low Risk  (02/28/2023)   Overall Financial Resource Strain (CARDIA)    Difficulty of Paying Living Expenses: Not hard at all  Food Insecurity: No Food Insecurity (02/28/2023)   Hunger Vital Sign    Worried About Running Out of Food in the Last Year: Never true    Ran Out of Food in the Last Year: Never true  Transportation Needs: No Transportation Needs (02/28/2023)   PRAPARE - Administrator, Civil Service (Medical): No    Lack of Transportation (Non-Medical): No  Physical Activity: Sufficiently Active (02/28/2023)   Exercise Vital Sign    Days of Exercise per Week: 4 days    Minutes of Exercise per Session: 60 min  Stress: No Stress Concern Present (02/28/2023)   [employer] of Occupational Health - Occupational Stress Questionnaire    Feeling of Stress : Not at all  Social Connections: Socially Isolated (02/28/2023)   Social Connection and Isolation Panel [NHANES]    Frequency of Communication with Friends and Family: More than three times a week    Frequency of Social Gatherings with Friends and Family: Once a week    Attends Religious Services: Never    Database administrator or Organizations: No    Attends Engineer, structural: Not on file    Marital Status: Never married  Intimate Partner Violence: Not At Risk (04/06/2023)   Humiliation, Afraid, Rape, and Kick questionnaire    Fear of Current or Ex-Partner: No    Emotionally Abused: No    Physically Abused: No    Sexually Abused: No    FAMILY HISTORY: Family History  Problem Relation Age of Onset   Diabetes Other    Stroke Other    Hypertension Other    Thyroid disease Other    Migraines Other    Breast cancer Maternal Aunt     ALLERGIES:  is allergic to montelukast sodium, paroxetine hcl, wellbutrin [bupropion], and escitalopram oxalate.  MEDICATIONS:  Current Outpatient Medications  Medication Sig Dispense Refill   buPROPion (WELLBUTRIN XL) 150 MG 24 hr tablet Take 1 tablet (150 mg  total) by mouth daily. 90 tablet 1   dexmethylphenidate (FOCALIN) 5 MG tablet Take 1 tablet (5 mg total) by mouth daily in the afternoon. 30 tablet 0   Dexmethylphenidate HCl 25 MG CP24 Take 1 capsule (25 mg total) by mouth daily at 6 (six) AM. 30 capsule 0   fexofenadine (ALLEGRA) 180 MG tablet Take 180 mg by mouth daily as needed for allergies.     fluticasone (FLONASE) 50 MCG/ACT nasal spray Place 2 sprays into both nostrils daily. 16 g 0   levothyroxine (SYNTHROID) 25 MCG tablet Take 1 tablet (25 mcg total) by mouth daily. 90 tablet 1   nitrofurantoin, macrocrystal-monohydrate, (MACROBID) 100 MG capsule Take 1 capsule (100  mg total) by mouth as needed. 30 capsule 3   Probiotic Product (PROBIOTIC DAILY PO) Take 1 capsule by mouth daily.     progesterone (PROMETRIUM) 100 MG capsule Take 100 mg by mouth at bedtime.     No current facility-administered medications for this visit.    REVIEW OF SYSTEMS:   Constitutional: ( - ) fevers, ( - )  chills , ( - ) night sweats Eyes: ( - ) blurriness of vision, ( - ) double vision, ( - ) watery eyes Ears, nose, mouth, throat, and face: ( - ) mucositis, ( - ) sore throat Respiratory: ( - ) cough, ( - ) dyspnea, ( - ) wheezes Cardiovascular: ( - ) palpitation, ( - ) chest discomfort, ( - ) lower extremity swelling Gastrointestinal:  ( - ) nausea, ( - ) heartburn, ( - ) change in bowel habits Skin: ( - ) abnormal skin rashes Lymphatics: ( - ) new lymphadenopathy, ( - ) easy bruising Neurological: ( - ) numbness, ( - ) tingling, ( - ) new weaknesses Behavioral/Psych: ( - ) mood change, ( - ) new changes  All other systems were reviewed with the patient and are negative.  PHYSICAL EXAMINATION: ECOG PERFORMANCE STATUS: 1 - Symptomatic but completely ambulatory  Vitals:   04/30/23 1213  BP: 109/76  Pulse: 65  Resp: 18  Temp: 98.4 F (36.9 C)  SpO2: 100%    Filed Weights   04/30/23 1213  Weight: 173 lb (78.5 kg)   GENERAL: well appearing  female in NAD  SKIN: skin color, texture, turgor are normal, no rashes or significant lesions EYES: conjunctiva are pink and non-injected, sclera clear OROPHARYNX: no exudate, no erythema; lips, buccal mucosa, and tongue normal  NECK: supple, non-tender LYMPH:  no palpable lymphadenopathy in the cervical, axillary or supraclavicular lymph nodes.  LUNGS: clear to auscultation and percussion with normal breathing effort HEART: regular rate & rhythm and no murmurs and no lower extremity edema ABDOMEN: soft, non-tender, non-distended, normal bowel sounds Musculoskeletal: no cyanosis of digits and no clubbing  PSYCH: alert & oriented x 3, fluent speech NEURO: no focal motor/sensory deficits  LABORATORY DATA:  I have reviewed the data as listed    Latest Ref Rng & Units 04/30/2023   11:32 AM 04/06/2023    2:07 PM 03/11/2023    9:03 AM  CBC  WBC 4.0 - 10.5 K/uL 2.8  3.0  2.5   Hemoglobin 12.0 - 15.0 g/dL 16.1  09.6  04.5   Hematocrit 36.0 - 46.0 % 37.4  38.0  41.2   Platelets 150 - 400 K/uL 138  181  194.0        Latest Ref Rng & Units 04/30/2023   11:32 AM 04/06/2023    2:07 PM 11/27/2022    8:12 AM  CMP  Glucose 70 - 99 mg/dL 409  87  56   BUN 6 - 20 mg/dL 13  17  19    Creatinine 0.44 - 1.00 mg/dL 8.11  9.14  7.82   Sodium 135 - 145 mmol/L 140  140  142   Potassium 3.5 - 5.1 mmol/L 4.4  3.7  4.2   Chloride 98 - 111 mmol/L 104  104  103   CO2 22 - 32 mmol/L 30  29  33   Calcium 8.9 - 10.3 mg/dL 9.5  9.4  9.9   Total Protein 6.5 - 8.1 g/dL 7.3  7.5  7.4   Total Bilirubin 0.3 - 1.2 mg/dL  0.7  0.4  0.6   Alkaline Phos 38 - 126 U/L 58  55  60   AST 15 - 41 U/L 15  18  24    ALT 0 - 44 U/L 8  10  19      ASSESSMENT & PLAN JAHNIYA REDSTONE is a 52 y.o. female who was referred to [place] for Neutropenia  Negative hepatitis B serologies, hepatitis C serologies, and HIV. Nutritional labs: vitamin B12, MMA and folate all normal, inflammatory markers ESR and CRP normal, Lyme negative, blood smear  pending. Review of her CBC today shows slightly worsening values- nutrition has been poor since last visit due to her weight loss medication which she has now stopped. We discussed a 6-8 week trial of balanced nutrition and lab recheck prior to proceeding forward with a bone marrow biopsy given relatively mild neutropenia.   Disposition Labs today RTC 6-8 weeks APP, labs  No orders of the defined types were placed in this encounter.   All questions were answered. The patient knows to call the clinic with any problems, questions or concerns.  I have spent a total of 45 minutes minutes of face-to-face and non-face-to-face time, preparing to see the patient, obtaining and/or reviewing separately obtained history, performing a medically appropriate examination, counseling and educating the patient, ordering medications/tests/procedures, referring and communicating with other health care professionals, documenting clinical information in the electronic health record, independently interpreting results and communicating results to the patient, and care coordination.    Clent Jacks PA-C Department of Hematology/Oncology [facility] at [facility]

## 2023-05-01 NOTE — Telephone Encounter (Signed)
Lvm for patient to call back about this message

## 2023-05-12 ENCOUNTER — Other Ambulatory Visit: Payer: Self-pay | Admitting: Family

## 2023-05-12 ENCOUNTER — Telehealth: Payer: PRIVATE HEALTH INSURANCE | Admitting: Physician Assistant

## 2023-05-12 DIAGNOSIS — B9689 Other specified bacterial agents as the cause of diseases classified elsewhere: Secondary | ICD-10-CM | POA: Diagnosis not present

## 2023-05-12 DIAGNOSIS — F988 Other specified behavioral and emotional disorders with onset usually occurring in childhood and adolescence: Secondary | ICD-10-CM

## 2023-05-12 DIAGNOSIS — J019 Acute sinusitis, unspecified: Secondary | ICD-10-CM

## 2023-05-12 MED ORDER — AMOXICILLIN-POT CLAVULANATE 875-125 MG PO TABS
1.0000 | ORAL_TABLET | Freq: Two times a day (BID) | ORAL | 0 refills | Status: DC
Start: 2023-05-12 — End: 2023-10-21

## 2023-05-12 MED ORDER — DEXMETHYLPHENIDATE HCL ER 25 MG PO CP24: 1.0000 | ORAL_CAPSULE | Freq: Every day | ORAL | 0 refills | Status: DC

## 2023-05-12 MED ORDER — FLUCONAZOLE 150 MG PO TABS: ORAL_TABLET | ORAL | 0 refills | Status: DC

## 2023-05-12 MED ORDER — DEXMETHYLPHENIDATE HCL 5 MG PO TABS
5.0000 mg | ORAL_TABLET | Freq: Every day | ORAL | 0 refills | Status: DC
Start: 2023-05-12 — End: 2023-06-08

## 2023-05-12 NOTE — Progress Notes (Signed)
E-Visit for Sinus Problems  We are sorry that you are not feeling well.  Here is how we plan to help!  Based on what you have shared with me it looks like you have sinusitis.  Sinusitis is inflammation and infection in the sinus cavities of the head.  Based on your presentation I believe you most likely have Acute Bacterial Sinusitis.  This is an infection caused by bacteria and is treated with antibiotics. I have prescribed Augmentin 875mg /125mg  one tablet twice daily with food, for 7 days. You may use an oral decongestant such as Mucinex D or if you have glaucoma or high blood pressure use plain Mucinex. Saline nasal spray help and can safely be used as often as needed for congestion.  If you develop worsening sinus pain, fever or notice severe headache and vision changes, or if symptoms are not better after completion of antibiotic, please schedule an appointment with a health care provider.    I have sent in Diflucan just in case of antibiotic-induced yeast infection.  Sinus infections are not as easily transmitted as other respiratory infection, however we still recommend that you avoid close contact with loved ones, especially the very young and elderly.  Remember to wash your hands thoroughly throughout the day as this is the number one way to prevent the spread of infection!  Home Care: Only take medications as instructed by your medical team. Complete the entire course of an antibiotic. Do not take these medications with alcohol. A steam or ultrasonic humidifier can help congestion.  You can place a towel over your head and breathe in the steam from hot water coming from a faucet. Avoid close contacts especially the very young and the elderly. Cover your mouth when you cough or sneeze. Always remember to wash your hands.  Get Help Right Away If: You develop worsening fever or sinus pain. You develop a severe head ache or visual changes. Your symptoms persist after you have completed  your treatment plan.  Make sure you Understand these instructions. Will watch your condition. Will get help right away if you are not doing well or get worse.  Thank you for choosing an e-visit.  Your e-visit answers were reviewed by a board certified advanced clinical practitioner to complete your personal care plan. Depending upon the condition, your plan could have included both over the counter or prescription medications.  Please review your pharmacy choice. Make sure the pharmacy is open so you can pick up prescription now. If there is a problem, you may contact your provider through [employer] and have the prescription routed to another pharmacy.  Your safety is important to [place]. If you have drug allergies check your prescription carefully.   For the next 24 hours you can use MyChart to ask questions about today's visit, request a non-urgent call back, or ask for a work or school excuse. You will get an email in the next two days asking about your experience. I hope that your e-visit has been valuable and will speed your recovery.

## 2023-05-12 NOTE — Progress Notes (Signed)
I have spent 5 minutes in review of e-visit questionnaire, review and updating patient chart, medical decision making and response to patient.   Piedad Climes, PA-C

## 2023-05-12 NOTE — Telephone Encounter (Signed)
Requesting: Focalin 25mg  and 5mg   Contract:09/19/22 UDS: 09/19/22 Last Visit: 12/31/22 Next Visit: None Last Refill: 03/26/23 #30 and 0RF   Please Advise

## 2023-05-14 ENCOUNTER — Ambulatory Visit: Payer: PRIVATE HEALTH INSURANCE | Admitting: Psychology

## 2023-05-14 DIAGNOSIS — F331 Major depressive disorder, recurrent, moderate: Secondary | ICD-10-CM | POA: Diagnosis not present

## 2023-05-14 NOTE — Progress Notes (Signed)
[facility] Counselor/Therapist Progress Note  Patient ID: [name], MRN: [id number],    Date: 05/14/2023  Time Spent: 9:00am-10:00am  60 minutes   Treatment Type: Individual Therapy  Reported Symptoms: stress  Mental Status Exam: Appearance:  Casual     Behavior: Appropriate  Motor: Normal  Speech/Language:  Normal Rate  Affect: Appropriate  Mood: normal  Thought process: normal  Thought content:   WNL  Sensory/Perceptual disturbances:   WNL  Orientation: oriented to person, place, time/date, and situation  Attention: Good  Concentration: Good  Memory: WNL  Fund of knowledge:  Good  Insight:   Good  Judgment:  Good  Impulse Control: Good   Risk Assessment: Danger to Self:  No Self-injurious Behavior: No Danger to Others: No Duty to Warn:no Physical Aggression / Violence:No  Access to Firearms a concern: No  Gang Involvement:No   Subjective: Pt present for face-to-face individual therapy via video.  Pt consents to telehealth video session and is aware of limitations and benefits of video sessions. Location of pt: home Location of therapist: home office.   Pt talked about her health.   She has not been feeling well, especially while she was on Ozempic shots for weight loss.  Pt has discontinued the shots but continues to not feel well.   She is having a flare up of her Hashimotto disease.  Pt is scheduled to see her PCP and get more blood work and possibly have a bone marrow biopsy.  Pt talked about her friend [place].  Pt has been upset with her responses to her texts.  Addressed the friend issues.   Helped pt process her feelings and relationship dynamics.  Pt is planning to buy a large camper and put it on her daughter's property.   Pt plans to move in November.  She is looking forward to how this will simplify her life and be able to spend more time with grandchildren.   Encouraged pt to increase self care. Provided supportive therapy.     Interventions: Cognitive Behavioral Therapy and Insight-Oriented  Diagnosis: F33.1  Plan of Care: Recommend ongoing therapy.   Pt participated in setting treatment goals.   Plan to meet monthly.    Treatment Plan Client Abilities/Strengths  Pt is bright, engaging, and motivated for therapy.   Client Treatment Preferences  Individual therapy.  Client Statement of Needs  Improve coping skills.  Symptoms  Depressed or irritable mood.  Problems Addressed  Unipolar Depression Goals 1. Alleviate depressive symptoms and return to previous level of effective functioning. 2. Appropriately grieve the loss in order to normalize mood and to return to previously adaptive level of functioning. Objective Learn and implement behavioral strategies to overcome depression. Target Date: 2023-06-14 Frequency: Monthly  Progress: 50 Modality: individual  Related Interventions Engage the client in "behavioral activation," increasing his/her activity level and contact with sources of reward, while identifying processes that inhibit activation.  Use behavioral techniques such as instruction, rehearsal, role-playing, role reversal, as needed, to facilitate activity in the client's daily life; reinforce success. Assist the client in developing skills that increase the likelihood of deriving pleasure from behavioral activation (e.g., assertiveness skills, developing an exercise plan, less internal/more external focus, increased social involvement); reinforce success. Objective Identify important people in life, past and present, and describe the quality, good and poor, of those relationships. Target Date: 2023-06-14 Frequency: Monthly  Progress: 50 Modality: individual  Related Interventions Conduct Interpersonal Therapy beginning with the assessment of the client's "interpersonal inventory" of  important past and present relationships; develop a case formulation linking depression to grief, interpersonal  role disputes, role transitions, and/or interpersonal deficits). Objective Learn and implement problem-solving and decision-making skills. Target Date: 2023-06-14 Frequency: Monthly  Progress: 50 Modality: individual  Related Interventions Conduct Problem-Solving Therapy using techniques such as psychoeducation, modeling, and role-playing to teach client problem-solving skills (i.e., defining a problem specifically, generating possible solutions, evaluating the pros and cons of each solution, selecting and implementing a plan of action, evaluating the efficacy of the plan, accepting or revising the plan); role-play application of the problem-solving skill to a real life issue. Encourage in the client the development of a positive problem orientation in which problems and solving them are viewed as a natural part of life and not something to be feared, despaired, or avoided. 3. Develop healthy interpersonal relationships that lead to the alleviation and help prevent the relapse of depression. 4. Develop healthy thinking patterns and beliefs about self, others, and the world that lead to the alleviation and help prevent the relapse of depression. 5. Recognize, accept, and cope with feelings of depression. Diagnosis F33.1  Conditions For Discharge Achievement of treatment goals and objectives   Salomon Fick, LCSW                 [facility] [place], LCSW

## 2023-05-22 ENCOUNTER — Other Ambulatory Visit: Payer: Self-pay | Admitting: Family

## 2023-05-22 DIAGNOSIS — Z1211 Encounter for screening for malignant neoplasm of colon: Secondary | ICD-10-CM

## 2023-05-22 DIAGNOSIS — Z1212 Encounter for screening for malignant neoplasm of rectum: Secondary | ICD-10-CM

## 2023-06-03 ENCOUNTER — Ambulatory Visit: Payer: PRIVATE HEALTH INSURANCE | Admitting: Psychology

## 2023-06-08 ENCOUNTER — Other Ambulatory Visit: Payer: Self-pay | Admitting: Family

## 2023-06-08 DIAGNOSIS — F988 Other specified behavioral and emotional disorders with onset usually occurring in childhood and adolescence: Secondary | ICD-10-CM

## 2023-06-08 DIAGNOSIS — F909 Attention-deficit hyperactivity disorder, unspecified type: Secondary | ICD-10-CM

## 2023-06-09 MED ORDER — DEXMETHYLPHENIDATE HCL ER 25 MG PO CP24
1.0000 | ORAL_CAPSULE | Freq: Every day | ORAL | 0 refills | Status: DC
Start: 2023-06-09 — End: 2023-07-14

## 2023-06-09 MED ORDER — DEXMETHYLPHENIDATE HCL 5 MG PO TABS
5.0000 mg | ORAL_TABLET | Freq: Every day | ORAL | 0 refills | Status: DC
Start: 2023-06-09 — End: 2023-07-14

## 2023-06-09 NOTE — Telephone Encounter (Signed)
Requesting: Focalin 25mg  and 5mg   Contract:09/19/22 UDS:09/19/22 Last Visit: 03/02/23 Next Visit: None Last Refill:  05/12/23 #30 and 0RF (x2)  Please Advise

## 2023-06-11 ENCOUNTER — Ambulatory Visit: Payer: PRIVATE HEALTH INSURANCE | Admitting: Psychology

## 2023-06-11 DIAGNOSIS — F331 Major depressive disorder, recurrent, moderate: Secondary | ICD-10-CM
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
## 2023-06-29 ENCOUNTER — Encounter: Payer: Self-pay | Admitting: Family

## 2023-06-29 DIAGNOSIS — D709 Neutropenia, unspecified: Secondary | ICD-10-CM

## 2023-06-29 DIAGNOSIS — D72819 Decreased white blood cell count, unspecified: Secondary | ICD-10-CM

## 2023-07-01 ENCOUNTER — Ambulatory Visit: Payer: PRIVATE HEALTH INSURANCE | Admitting: Psychology

## 2023-07-01 DIAGNOSIS — F331 Major depressive disorder, recurrent, moderate: Secondary | ICD-10-CM | POA: Diagnosis not present
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
## 2023-07-14 ENCOUNTER — Other Ambulatory Visit: Payer: Self-pay | Admitting: Family

## 2023-07-14 DIAGNOSIS — F909 Attention-deficit hyperactivity disorder, unspecified type: Secondary | ICD-10-CM

## 2023-07-14 MED ORDER — DEXMETHYLPHENIDATE HCL ER 25 MG PO CP24: 1.0000 | ORAL_CAPSULE | Freq: Every day | ORAL | 0 refills | Status: DC

## 2023-07-14 MED ORDER — DEXMETHYLPHENIDATE HCL 5 MG PO TABS: 5.0000 mg | ORAL_TABLET | Freq: Every day | ORAL | 0 refills | Status: DC

## 2023-07-14 NOTE — Telephone Encounter (Signed)
Requesting: Focalin 25mg  and 5mg   Contract:  09/19/22 UDS: 09/19/22 Last Visit: 03/02/23 Next Visit: None Last Refill: see med list, multiple controlled substances requested    Please Advise

## 2023-07-27 ENCOUNTER — Ambulatory Visit: Payer: PRIVATE HEALTH INSURANCE | Admitting: Psychology

## 2023-07-27 DIAGNOSIS — F331 Major depressive disorder, recurrent, moderate: Secondary | ICD-10-CM | POA: Diagnosis not present

## 2023-07-27 NOTE — Progress Notes (Signed)
[facility] Counselor/Therapist Progress Note  Patient ID: LAMANI LEIST, MRN: [id number],    Date: 07/27/2023  Time Spent: 4:00pm-4:55pm 55  minutes   Treatment Type: Individual Therapy  Reported Symptoms: stress  Mental Status Exam: Appearance:  Casual     Behavior: Appropriate  Motor: Normal  Speech/Language:  Normal Rate  Affect: Appropriate  Mood: normal  Thought process: normal  Thought content:   WNL  Sensory/Perceptual disturbances:   WNL  Orientation: oriented to person, place, time/date, and situation  Attention: Good  Concentration: Good  Memory: WNL  Fund of knowledge:  Good  Insight:   Good  Judgment:  Good  Impulse Control: Good   Risk Assessment: Danger to Self:  No Self-injurious Behavior: No Danger to Others: No Duty to Warn:no Physical Aggression / Violence:No  Access to Firearms a concern: No  Gang Involvement:No   Subjective: Pt present for face-to-face individual therapy via video.  Pt consents to telehealth video session and is aware of limitations and benefits of virtual sessions.   Location of pt: home Location of therapist: home office.   Pt talked about moving out of her rental house.   She is living with her daughter for a few weeks while she gets work done to be able to move into her camper.   There has been some stress between pt and her daughter bc of the close living situation but pt is coping with it and communicating her needs to her family. Pt talked about her relationship with her friend Denny Peon.   Denny Peon has a lot of mood swings.  They have had arguments.   Addressed the interactions and how they impacted pt.    Helped pt process her feelings and relationship dynamics. Worked on self care strategies. Provided supportive therapy.    Interventions: Cognitive Behavioral Therapy and Insight-Oriented  Diagnosis:  F33.1  Plan of Care: Recommend ongoing therapy.   Pt participated in setting treatment goals.   Plan to meet  monthly.  Pt agrees with treatment plan.   Treatment Plan Client Abilities/Strengths  Pt is bright, engaging, and motivated for therapy.   Client Treatment Preferences  Individual therapy.  Client Statement of Needs  Improve coping skills.  Symptoms  Depressed or irritable mood.  Problems Addressed  Unipolar Depression Goals 1. Alleviate depressive symptoms and return to previous level of effective functioning. 2. Appropriately grieve the loss in order to normalize mood and to return to previously adaptive level of functioning. Objective Learn and implement behavioral strategies to overcome depression. Target Date: 2024-06-30 Frequency: Monthly  Progress: 55 Modality: individual  Related Interventions Engage the client in "behavioral activation," increasing his/her activity level and contact with sources of reward, while identifying processes that inhibit activation.  Use behavioral techniques such as instruction, rehearsal, role-playing, role reversal, as needed, to facilitate activity in the client's daily life; reinforce success. Assist the client in developing skills that increase the likelihood of deriving pleasure from behavioral activation (e.g., assertiveness skills, developing an exercise plan, less internal/more external focus, increased social involvement); reinforce success. Objective Identify important people in life, past and present, and describe the quality, good and poor, of those relationships. Target Date: 2024-06-30 Frequency: Monthly  Progress: 55 Modality: individual  Related Interventions Conduct Interpersonal Therapy beginning with the assessment of the client's "interpersonal inventory" of important past and present relationships; develop a case formulation linking depression to grief, interpersonal role disputes, role transitions, and/or interpersonal deficits). Objective Learn and implement problem-solving and decision-making skills. Target Date:  2024-06-30  Frequency: Monthly  Progress: 55 Modality: individual  Related Interventions Conduct Problem-Solving Therapy using techniques such as psychoeducation, modeling, and role-playing to teach client problem-solving skills (i.e., defining a problem specifically, generating possible solutions, evaluating the pros and cons of each solution, selecting and implementing a plan of action, evaluating the efficacy of the plan, accepting or revising the plan); role-play application of the problem-solving skill to a real life issue. Encourage in the client the development of a positive problem orientation in which problems and solving them are viewed as a natural part of life and not something to be feared, despaired, or avoided. 3. Develop healthy interpersonal relationships that lead to the alleviation and help prevent the relapse of depression. 4. Develop healthy thinking patterns and beliefs about self, others, and the world that lead to the alleviation and help prevent the relapse of depression. 5. Recognize, accept, and cope with feelings of depression. Diagnosis F33.1  Conditions For Discharge Achievement of treatment goals and objectives   Salomon Fick, LCSW

## 2023-08-13 ENCOUNTER — Other Ambulatory Visit: Payer: Self-pay | Admitting: Family

## 2023-08-13 DIAGNOSIS — F909 Attention-deficit hyperactivity disorder, unspecified type: Secondary | ICD-10-CM

## 2023-08-14 NOTE — Telephone Encounter (Signed)
Requesting: Focalin 25mg  and 5mg   Contract:  09/19/22 UDS: 09/19/22 Last Visit: 03/02/23 Next Visit: None Last Refill: 07/14/2023 one month no refills   Please Advise

## 2023-08-15 MED ORDER — DEXMETHYLPHENIDATE HCL 5 MG PO TABS: 5.0000 mg | ORAL_TABLET | Freq: Every day | ORAL | 0 refills | Status: DC

## 2023-08-15 MED ORDER — DEXMETHYLPHENIDATE HCL ER 25 MG PO CP24: 1.0000 | ORAL_CAPSULE | Freq: Every day | ORAL | 0 refills | Status: DC

## 2023-08-24 ENCOUNTER — Ambulatory Visit: Payer: PRIVATE HEALTH INSURANCE | Admitting: Psychology

## 2023-08-24 DIAGNOSIS — F331 Major depressive disorder, recurrent, moderate: Secondary | ICD-10-CM | POA: Diagnosis not present

## 2023-08-24 NOTE — Progress Notes (Signed)
 [facility] Counselor/Therapist Progress Note  Patient ID: [name], MRN: [id number],    Date: 08/24/2023  Time Spent: 2:00pm-2:55pm 55  minutes   Treatment Type: Individual Therapy  Reported Symptoms: stress  Mental Status Exam: Appearance:  Casual     Behavior: Appropriate  Motor: Normal  Speech/Language:  Normal Rate  Affect: Appropriate  Mood: normal  Thought process: normal  Thought content:   WNL  Sensory/Perceptual disturbances:   WNL  Orientation: oriented to person, place, time/date, and situation  Attention: Good  Concentration: Good  Memory: WNL  Fund of knowledge:  Good  Insight:   Good  Judgment:  Good  Impulse Control: Good   Risk Assessment: Danger to Self:  No Self-injurious Behavior: No Danger to Others: No Duty to Warn:no Physical Aggression / Violence:No  Access to Firearms a concern: No  Gang Involvement:No   Subjective: Pt present for face-to-face individual therapy via video.  Pt consents to telehealth video session and is aware of limitations and benefits of virtual sessions.   Location of pt: home Location of therapist: home office.   Pt talked about feeling frustrated that she is still not moved into her camper.   She is still staying with her daughter.   This has been stressful.   They have differences about how the kids should be disciplined.   Pt feels overwhelmed with the living situation.    Addressed the interactions and how they impacted pt.   Helped pt process her feelings and relationship dynamics. Pt talked about her health.  She has had some flare ups.   Her joints have been hurting. 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   She is upset about the incident and how some people are trying to turn it into a race issue.  Addressed pt's feelings and loss.   Worked on self care strategies. Provided supportive therapy.    Interventions: Cognitive Behavioral Therapy and  Insight-Oriented  Diagnosis:  F33.1  Plan of Care: Recommend ongoing therapy.   Pt participated in setting treatment goals.   Plan to meet monthly.  Pt agrees with treatment plan.   Treatment Plan Client Abilities/Strengths  Pt is bright, engaging, and motivated for therapy.   Client Treatment Preferences  Individual therapy.  Client Statement of Needs  Improve coping skills.  Symptoms  Depressed or irritable mood.  Problems Addressed  Unipolar Depression Goals 1. Alleviate depressive symptoms and return to previous level of effective functioning. 2. Appropriately grieve the loss in order to normalize mood and to return to previously adaptive level of functioning. Objective Learn and implement behavioral strategies to overcome depression. Target Date: 2024-06-30 Frequency: Monthly  Progress: 55 Modality: individual  Related Interventions Engage the client in behavioral activation, increasing his/her activity level and contact with sources of reward, while identifying processes that inhibit activation.  Use behavioral techniques such as instruction, rehearsal, role-playing, role reversal, as needed, to facilitate activity in the client's daily life; reinforce success. Assist the client in developing skills that increase the likelihood of deriving pleasure from behavioral activation (e.g., assertiveness skills, developing an exercise plan, less internal/more external focus, increased social involvement); reinforce success. Objective Identify important people in life, past and present, and describe the quality, good and poor, of those relationships. Target Date: 2024-06-30 Frequency: Monthly  Progress: 55 Modality: individual  Related Interventions Conduct Interpersonal Therapy beginning with the assessment of the client's interpersonal inventory of important past and present relationships; develop  a case formulation linking depression to grief, interpersonal role disputes, role  transitions, and/or interpersonal deficits). Objective Learn and implement problem-solving and decision-making skills. Target Date: 2024-06-30 Frequency: Monthly  Progress: 55 Modality: individual  Related Interventions Conduct Problem-Solving Therapy using techniques such as psychoeducation, modeling, and role-playing to teach client problem-solving skills (i.e., defining a problem specifically, generating possible solutions, evaluating the pros and cons of each solution, selecting and implementing a plan of action, evaluating the efficacy of the plan, accepting or revising the plan); role-play application of the problem-solving skill to a real life issue. Encourage in the client the development of a positive problem orientation in which problems and solving them are viewed as a natural part of life and not something to be feared, despaired, or avoided. 3. Develop healthy interpersonal relationships that lead to the alleviation and help prevent the relapse of depression. 4. Develop healthy thinking patterns and beliefs about self, others, and the world that lead to the alleviation and help prevent the relapse of depression. 5. Recognize, accept, and cope with feelings of depression. Diagnosis F33.1  Conditions For Discharge Achievement of treatment goals and objectives   Veva Alma, LCSW

## 2023-08-26 ENCOUNTER — Other Ambulatory Visit: Payer: Self-pay | Admitting: Family

## 2023-09-16 ENCOUNTER — Encounter: Payer: Self-pay | Admitting: Family

## 2023-09-16 DIAGNOSIS — F909 Attention-deficit hyperactivity disorder, unspecified type: Secondary | ICD-10-CM

## 2023-09-17 MED ORDER — DEXMETHYLPHENIDATE HCL ER 25 MG PO CP24: 1.0000 | ORAL_CAPSULE | Freq: Every day | ORAL | 0 refills | Status: DC

## 2023-09-17 MED ORDER — DEXMETHYLPHENIDATE HCL 5 MG PO TABS: 5.0000 mg | ORAL_TABLET | Freq: Every day | ORAL | 0 refills | Status: DC

## 2023-09-17 NOTE — Telephone Encounter (Signed)
Requesting: Focalin 25MG  and Focalin 5mg   Contract: 09/19/22 UDS: 09/19/22 Last Visit: 03/02/23 Next Visit: None Last Refill: see med list   Please Advise

## 2023-09-20 ENCOUNTER — Telehealth: Payer: Self-pay | Admitting: Family

## 2023-09-20 DIAGNOSIS — Z1211 Encounter for screening for malignant neoplasm of colon: Secondary | ICD-10-CM

## 2023-09-20 NOTE — Telephone Encounter (Signed)
Hi [name],  It looks like you are due for a cologuard. I am going to have the company send you a kit to complete and return at your earliest convenience please.  Take care,  Gio Janoski

## 2023-09-21 ENCOUNTER — Ambulatory Visit: Payer: PRIVATE HEALTH INSURANCE | Admitting: Psychology

## 2023-09-23 ENCOUNTER — Ambulatory Visit: Payer: PRIVATE HEALTH INSURANCE | Admitting: Psychology

## 2023-09-23 DIAGNOSIS — F331 Major depressive disorder, recurrent, moderate: Secondary | ICD-10-CM

## 2023-09-23 NOTE — Progress Notes (Signed)
 [facility] Counselor/Therapist Progress Note  Patient ID: [name], MRN: [id number],    Date: 09/23/2023  Time Spent: 4:00pm-4:55pm 55  minutes   Treatment Type: Individual Therapy  Reported Symptoms: stress  Mental Status Exam: Appearance:  Casual     Behavior: Appropriate  Motor: Normal  Speech/Language:  Normal Rate  Affect: Appropriate  Mood: normal  Thought process: normal  Thought content:   WNL  Sensory/Perceptual disturbances:   WNL  Orientation: oriented to person, place, time/date, and situation  Attention: Good  Concentration: Good  Memory: WNL  Fund of knowledge:  Good  Insight:   Good  Judgment:  Good  Impulse Control: Good   Risk Assessment: Danger to Self:  No Self-injurious Behavior: No Danger to Others: No Duty to Warn:no Physical Aggression / Violence:No  Access to Firearms a concern: No  Gang Involvement:No   Subjective: Pt present for face-to-face individual therapy via video.  Pt consents to telehealth video session and is aware of limitations and benefits of virtual sessions.   Location of pt: home Location of therapist: home office.   Pt talked about the frustration of still not being in her camper.   The delay is waiting for the gravel to be laid.    Pt talked about falling when she was hiking.  She hit her head and hurt her knee.   She also got a mild concussion. Pt talked about her friendship with Rocky.   Erin reached out to pt and apologized for what happened when they went hiking a few months ago.   Pt accepted the apology but is hesitant to reconnect with Erin bc it was not a healthy relationship.   Pt talked about work.  There have been a lot of stressors and some issues with patient care.  Addressed the issues and how they impact pt.  Pt is an excellent patient advocate and cares very much about working to minimize treatment errors.   Worked on self care strategies. Provided supportive therapy.    Interventions:  Cognitive Behavioral Therapy and Insight-Oriented  Diagnosis:  F33.1  Plan of Care: Recommend ongoing therapy.   Pt participated in setting treatment goals.   Plan to meet monthly.  Pt agrees with treatment plan.   Treatment Plan Client Abilities/Strengths  Pt is bright, engaging, and motivated for therapy.   Client Treatment Preferences  Individual therapy.  Client Statement of Needs  Improve coping skills.  Symptoms  Depressed or irritable mood.  Problems Addressed  Unipolar Depression Goals 1. Alleviate depressive symptoms and return to previous level of effective functioning. 2. Appropriately grieve the loss in order to normalize mood and to return to previously adaptive level of functioning. Objective Learn and implement behavioral strategies to overcome depression. Target Date: 2024-06-30 Frequency: Monthly  Progress: 55 Modality: individual  Related Interventions Engage the client in behavioral activation, increasing his/her activity level and contact with sources of reward, while identifying processes that inhibit activation.  Use behavioral techniques such as instruction, rehearsal, role-playing, role reversal, as needed, to facilitate activity in the client's daily life; reinforce success. Assist the client in developing skills that increase the likelihood of deriving pleasure from behavioral activation (e.g., assertiveness skills, developing an exercise plan, less internal/more external focus, increased social involvement); reinforce success. Objective Identify important people in life, past and present, and describe the quality, good and poor, of those relationships. Target Date: 2024-06-30 Frequency: Monthly  Progress: 55 Modality: individual  Related Interventions Conduct Interpersonal Therapy beginning with the  assessment of the client's interpersonal inventory of important past and present relationships; develop a case formulation linking depression to grief,  interpersonal role disputes, role transitions, and/or interpersonal deficits). Objective Learn and implement problem-solving and decision-making skills. Target Date: 2024-06-30 Frequency: Monthly  Progress: 55 Modality: individual  Related Interventions Conduct Problem-Solving Therapy using techniques such as psychoeducation, modeling, and role-playing to teach client problem-solving skills (i.e., defining a problem specifically, generating possible solutions, evaluating the pros and cons of each solution, selecting and implementing a plan of action, evaluating the efficacy of the plan, accepting or revising the plan); role-play application of the problem-solving skill to a real life issue. Encourage in the client the development of a positive problem orientation in which problems and solving them are viewed as a natural part of life and not something to be feared, despaired, or avoided. 3. Develop healthy interpersonal relationships that lead to the alleviation and help prevent the relapse of depression. 4. Develop healthy thinking patterns and beliefs about self, others, and the world that lead to the alleviation and help prevent the relapse of depression. 5. Recognize, accept, and cope with feelings of depression. Diagnosis F33.1  Conditions For Discharge Achievement of treatment goals and objectives   Veva Alma, LCSW

## 2023-10-18 ENCOUNTER — Other Ambulatory Visit: Payer: Self-pay | Admitting: Family

## 2023-10-18 DIAGNOSIS — F909 Attention-deficit hyperactivity disorder, unspecified type: Secondary | ICD-10-CM

## 2023-10-19 ENCOUNTER — Ambulatory Visit: Payer: PRIVATE HEALTH INSURANCE | Admitting: Psychology

## 2023-10-19 DIAGNOSIS — F331 Major depressive disorder, recurrent, moderate: Secondary | ICD-10-CM

## 2023-10-19 NOTE — Telephone Encounter (Signed)
 Requesting: Focalin 25mg  and 5mg  Contract: 09/19/22 UDS: 09/19/22 Last Visit: 03/02/23 Next Visit: None Last Refill: see med list  Please Advise

## 2023-10-19 NOTE — Progress Notes (Signed)
 [facility] Counselor/Therapist Progress Note  Patient ID: [name], MRN: [id number],    Date: 10/19/2023  Time Spent: 2:00pm-2:55pm 55  minutes   Treatment Type: Individual Therapy  Reported Symptoms: stress  Mental Status Exam: Appearance:  Casual     Behavior: Appropriate  Motor: Normal  Speech/Language:  Normal Rate  Affect: Appropriate  Mood: normal  Thought process: normal  Thought content:   WNL  Sensory/Perceptual disturbances:   WNL  Orientation: oriented to person, place, time/date, and situation  Attention: Good  Concentration: Good  Memory: WNL  Fund of knowledge:  Good  Insight:   Good  Judgment:  Good  Impulse Control: Good   Risk Assessment: Danger to Self:  No Self-injurious Behavior: No Danger to Others: No Duty to Warn:no Physical Aggression / Violence:No  Access to Firearms a concern: No  Gang Involvement:No   Subjective: Pt present for face-to-face individual therapy via video.  Pt consents to telehealth video session and is aware of limitations and benefits of virtual sessions.   Location of pt: home Location of therapist: home office.   Pt talked about the frustration of still not being in her camper.   The gravel has been laid but it has to be leveled. Pt talked about work.  There have been issues with a colleague who has done questionable things regarding pt care.   The colleague also tends to create work drams and has been bad mouthing pt.  Addressed the incidents and how they impacted pt.   Helped pt process work dynamics.   Addressed pt's part in the dynamics and identified that she could work on not engaging in gossip.   Worked on self care strategies. Provided supportive therapy.    Interventions: Cognitive Behavioral Therapy and Insight-Oriented  Diagnosis:  F33.1  Plan of Care: Recommend ongoing therapy.   Pt participated in setting treatment goals.   Plan to meet monthly.  Pt agrees with treatment plan.    Treatment Plan Client Abilities/Strengths  Pt is bright, engaging, and motivated for therapy.   Client Treatment Preferences  Individual therapy.  Client Statement of Needs  Improve coping skills.  Symptoms  Depressed or irritable mood.  Problems Addressed  Unipolar Depression Goals 1. Alleviate depressive symptoms and return to previous level of effective functioning. 2. Appropriately grieve the loss in order to normalize mood and to return to previously adaptive level of functioning. Objective Learn and implement behavioral strategies to overcome depression. Target Date: 2024-06-30 Frequency: Monthly  Progress: 55 Modality: individual  Related Interventions Engage the client in "behavioral activation," increasing his/her activity level and contact with sources of reward, while identifying processes that inhibit activation.  Use behavioral techniques such as instruction, rehearsal, role-playing, role reversal, as needed, to facilitate activity in the client's daily life; reinforce success. Assist the client in developing skills that increase the likelihood of deriving pleasure from behavioral activation (e.g., assertiveness skills, developing an exercise plan, less internal/more external focus, increased social involvement); reinforce success. Objective Identify important people in life, past and present, and describe the quality, good and poor, of those relationships. Target Date: 2024-06-30 Frequency: Monthly  Progress: 55 Modality: individual  Related Interventions Conduct Interpersonal Therapy beginning with the assessment of the client's "interpersonal inventory" of important past and present relationships; develop a case formulation linking depression to grief, interpersonal role disputes, role transitions, and/or interpersonal deficits). Objective Learn and implement problem-solving and decision-making skills. Target Date: 2024-06-30 Frequency: Monthly  Progress: 55  Modality: individual  Related  Interventions Conduct Problem-Solving Therapy using techniques such as psychoeducation, modeling, and role-playing to teach client problem-solving skills (i.e., defining a problem specifically, generating possible solutions, evaluating the pros and cons of each solution, selecting and implementing a plan of action, evaluating the efficacy of the plan, accepting or revising the plan); role-play application of the problem-solving skill to a real life issue. Encourage in the client the development of a positive problem orientation in which problems and solving them are viewed as a natural part of life and not something to be feared, despaired, or avoided. 3. Develop healthy interpersonal relationships that lead to the alleviation and help prevent the relapse of depression. 4. Develop healthy thinking patterns and beliefs about self, others, and the world that lead to the alleviation and help prevent the relapse of depression. 5. Recognize, accept, and cope with feelings of depression. Diagnosis F33.1  Conditions For Discharge Achievement of treatment goals and objectives   Salomon Fick, LCSW

## 2023-10-21 ENCOUNTER — Ambulatory Visit: Payer: PRIVATE HEALTH INSURANCE | Admitting: Family

## 2023-10-21 ENCOUNTER — Other Ambulatory Visit: Payer: Self-pay | Admitting: Family

## 2023-10-21 VITALS — BP 100/65 | HR 62 | Temp 97.5°F | Resp 16 | Ht 66.0 in | Wt 179.0 lb

## 2023-10-21 DIAGNOSIS — R918 Other nonspecific abnormal finding of lung field: Secondary | ICD-10-CM

## 2023-10-21 DIAGNOSIS — D696 Thrombocytopenia, unspecified: Secondary | ICD-10-CM | POA: Diagnosis not present

## 2023-10-21 DIAGNOSIS — Z1231 Encounter for screening mammogram for malignant neoplasm of breast: Secondary | ICD-10-CM

## 2023-10-21 DIAGNOSIS — F909 Attention-deficit hyperactivity disorder, unspecified type: Secondary | ICD-10-CM

## 2023-10-21 DIAGNOSIS — E0590 Thyrotoxicosis, unspecified without thyrotoxic crisis or storm: Secondary | ICD-10-CM | POA: Diagnosis not present

## 2023-10-21 DIAGNOSIS — F418 Other specified anxiety disorders: Secondary | ICD-10-CM | POA: Diagnosis not present

## 2023-10-21 DIAGNOSIS — J45909 Unspecified asthma, uncomplicated: Secondary | ICD-10-CM | POA: Diagnosis not present

## 2023-10-21 LAB — CBC WITH DIFFERENTIAL/PLATELET
Basophils Absolute: 0 10*3/uL (ref 0.0–0.1)
Basophils Relative: 1.3 % (ref 0.0–3.0)
Eosinophils Absolute: 0.1 10*3/uL (ref 0.0–0.7)
Eosinophils Relative: 1.9 % (ref 0.0–5.0)
HCT: 39.4 % (ref 36.0–46.0)
Hemoglobin: 13 g/dL (ref 12.0–15.0)
Lymphocytes Relative: 45.3 % (ref 12.0–46.0)
Lymphs Abs: 1.3 10*3/uL (ref 0.7–4.0)
MCHC: 33.1 g/dL (ref 30.0–36.0)
MCV: 95.2 fl (ref 78.0–100.0)
Monocytes Absolute: 0.3 10*3/uL (ref 0.1–1.0)
Monocytes Relative: 9.7 % (ref 3.0–12.0)
Neutro Abs: 1.2 10*3/uL — ABNORMAL LOW (ref 1.4–7.7)
Neutrophils Relative %: 41.8 % — ABNORMAL LOW (ref 43.0–77.0)
Platelets: 206 10*3/uL (ref 150.0–400.0)
RBC: 4.13 Mil/uL (ref 3.87–5.11)
RDW: 12.9 % (ref 11.5–15.5)
WBC: 3 10*3/uL — ABNORMAL LOW (ref 4.0–10.5)

## 2023-10-21 LAB — COMPREHENSIVE METABOLIC PANEL
ALT: 19 U/L (ref 0–35)
AST: 26 U/L (ref 0–37)
Albumin: 4.7 g/dL (ref 3.5–5.2)
Alkaline Phosphatase: 64 U/L (ref 39–117)
BUN: 19 mg/dL (ref 6–23)
CO2: 31 meq/L (ref 19–32)
Calcium: 9.6 mg/dL (ref 8.4–10.5)
Chloride: 103 meq/L (ref 96–112)
Creatinine, Ser: 0.93 mg/dL (ref 0.40–1.20)
GFR: 71.22 mL/min (ref >60.00)
Glucose, Bld: 92 mg/dL (ref 70–99)
Potassium: 4.1 meq/L (ref 3.5–5.1)
Sodium: 139 meq/L (ref 135–145)
Total Bilirubin: 0.7 mg/dL (ref 0.2–1.2)
Total Protein: 7.6 g/dL (ref 6.0–8.3)

## 2023-10-21 LAB — TSH: TSH: 4.3 u[IU]/mL (ref 0.35–5.50)

## 2023-10-21 MED ORDER — LEVOTHYROXINE SODIUM 25 MCG PO TABS: 25.0000 ug | ORAL_TABLET | Freq: Every day | ORAL | 1 refills | Status: AC

## 2023-10-21 MED ORDER — DEXMETHYLPHENIDATE HCL 5 MG PO TABS: 5.0000 mg | ORAL_TABLET | Freq: Every day | ORAL | 0 refills | Status: DC

## 2023-10-21 MED ORDER — DEXMETHYLPHENIDATE HCL ER 25 MG PO CP24: 1.0000 | ORAL_CAPSULE | Freq: Every day | ORAL | 0 refills | Status: DC

## 2023-10-21 MED ORDER — BUPROPION HCL ER (XL) 150 MG PO TB24: 150.0000 mg | ORAL_TABLET | Freq: Every day | ORAL | 1 refills | Status: AC

## 2023-10-21 NOTE — Progress Notes (Signed)
 Subjective:     Patient ID: [name], female    DOB: 12/07/1970, 53 y.o.   MRN: [id number]  Chief Complaint  Patient presents with   Depression    Here for follow up   ADHD    Here for follow up    HPI  Discussed the use of AI scribe software for clinical note transcription with  the patient, who gave verbal consent to proceed.  History of Present Illness  Patient is a 53 yr old female who presents today for follow up.  She reports that she generally feels well on her Focalin and wishes to continue. She notes that on the days when she forgets to take her 5mg  PM dose her attention wanes in the afternoon.  Reports stable mood on wellbutrin.  No recent asthma symptoms- usually only occurs with rigorous exercise.        Health Maintenance Due  Topic Date Due   Pneumococcal Vaccine 20-41 Years old (1 of 2 - PCV) Never done   Fecal DNA (Cologuard)  Never done   Zoster Vaccines- Shingrix (1 of 2) Never done   COVID-19 Vaccine (4 - 2024-25 season) 04/19/2023    Past Medical History:  Diagnosis Date   ADD (attention deficit disorder)    Adjustment disorder    without depression   Allergy    allergic rhinitis   Asthma    hx of exercised induced   Depression    Endometriosis    Former smoker    SVD (spontaneous vaginal delivery)    x 1   Thyroid disease    hyperthyroidism / Graves Disease - no treatment needed for years per patient   Unilateral primary osteoarthritis, right hip 07/17/2020    Past Surgical History:  Procedure Laterality Date   AUGMENTATION MAMMAPLASTY     BREAST BIOPSY Bilateral 2016   BREAST SURGERY  2004   augmentation   DILITATION & CURRETTAGE/HYSTROSCOPY WITH HYDROTHERMAL ABLATION N/A 12/21/2014   Procedure: DILATATION & CURETTAGE/HYSTEROSCOPY WITH HYDROTHERMAL ABLATION;  Surgeon: Marcelle Overlie, MD;  Location: [facility];  Service: Gynecology;  Laterality: N/A;   LAPAROSCOPIC TUBAL LIGATION Bilateral 12/21/2014   Procedure: LAPAROSCOPIC  TUBAL LIGATION;  Surgeon: Marcelle Overlie, MD;  Location: [facility];  Service: Gynecology;  Laterality: Bilateral;   right ankle surgery  2002   TOTAL HIP ARTHROPLASTY Right 09/21/2020   Procedure: RIGHT TOTAL HIP ARTHROPLASTY ANTERIOR APPROACH;  Surgeon: Kathryne Hitch, MD;  Location: [facility];  Service: Orthopedics;  Laterality: Right;   TUBAL LIGATION  12/21/14   WISDOM TOOTH EXTRACTION      Family History  Problem Relation Age of Onset   Diabetes Other    Stroke Other    Hypertension Other    Thyroid disease Other    Migraines Other    Breast cancer Maternal Aunt     Social History   Socioeconomic History   Marital status: Single    Spouse name: Not on file   Number of children: 1   Years of education: Not on file   Highest education level: Bachelor's degree (e.g., BA, AB, BS)  Occupational History   Occupation: Teacher, adult education: [employer]  Tobacco Use   Smoking status: Former    Current packs/day: 0.00    Average packs/day: 0.5 packs/day for 15.0 years (7.5 ttl pk-yrs)    Types: Cigarettes    Start date: 07/18/1996    Quit date: 07/19/2011    Years since quitting: 12.2  Smokeless tobacco: Never  Vaping Use   Vaping status: Never Used  Substance and Sexual Activity   Alcohol use: Not Currently    Comment: socially   Drug use: No   Sexual activity: Yes    Birth control/protection: Surgical    Comment: on loestrin   Other Topics Concern   Not on file  Social History Narrative   Works as an Charity fundraiser   Social Drivers of Corporate investment banker Strain: Low Risk  (02/28/2023)   Overall Financial Resource Strain (CARDIA)    Difficulty of Paying Living Expenses: Not hard at all  Food Insecurity: No Food Insecurity (02/28/2023)   Hunger Vital Sign    Worried About Running Out of Food in the Last Year: Never true    Ran Out of Food in the Last Year: Never true  Transportation Needs: No Transportation Needs (02/28/2023)   PRAPARE - Scientist, research (physical sciences) (Medical): No    Lack of Transportation (Non-Medical): No  Physical Activity: Sufficiently Active (02/28/2023)   Exercise Vital Sign    Days of Exercise per Week: 4 days    Minutes of Exercise per Session: 60 min  Stress: No Stress Concern Present (02/28/2023)   [employer] of Occupational Health - Occupational Stress Questionnaire    Feeling of Stress : Not at all  Social Connections: Socially Isolated (02/28/2023)   Social Connection and Isolation Panel [NHANES]    Frequency of Communication with Friends and Family: More than three times a week    Frequency of Social Gatherings with Friends and Family: Once a week    Attends Religious Services: Never    Database administrator or Organizations: No    Attends Engineer, structural: Not on file    Marital Status: Never married  Intimate Partner Violence: Not At Risk (04/06/2023)   Humiliation, Afraid, Rape, and Kick questionnaire    Fear of Current or Ex-Partner: No    Emotionally Abused: No    Physically Abused: No    Sexually Abused: No    Outpatient Medications Prior to Visit  Medication Sig Dispense Refill   fexofenadine (ALLEGRA) 180 MG tablet Take 180 mg by mouth daily as needed for allergies.     fluticasone (FLONASE) 50 MCG/ACT nasal spray Place 2 sprays into both nostrils daily. 16 g 0   nitrofurantoin, macrocrystal-monohydrate, (MACROBID) 100 MG capsule Take 1 capsule (100 mg total) by mouth as needed. 30 capsule 3   Probiotic Product (PROBIOTIC DAILY PO) Take 1 capsule by mouth daily.     progesterone (PROMETRIUM) 100 MG capsule Take 100 mg by mouth at bedtime.     buPROPion (WELLBUTRIN XL) 150 MG 24 hr tablet Take 1 tablet (150 mg total) by mouth daily. 90 tablet 1   dexmethylphenidate (FOCALIN) 5 MG tablet Take 1 tablet (5 mg total) by mouth daily in the afternoon. 30 tablet 0   Dexmethylphenidate HCl 25 MG CP24 Take 1 capsule (25 mg total) by mouth daily at 6 (six) AM. 30 capsule 0    levothyroxine (SYNTHROID) 25 MCG tablet Take 1 tablet (25 mcg total) by mouth daily. 90 tablet 1   amoxicillin-clavulanate (AUGMENTIN) 875-125 MG tablet Take 1 tablet by mouth 2 (two) times daily. 14 tablet 0   fluconazole (DIFLUCAN) 150 MG tablet Take 1 tablet PO once. Repeat in 3 days if needed. 2 tablet 0   No facility-administered medications prior to visit.    Allergies  Allergen Reactions  Montelukast Sodium Swelling    Causes swelling and pain in the joints.   Paroxetine Hcl Other (See Comments)    Too high of a dose makes depressed    Wellbutrin [Bupropion] Other (See Comments)    Did well on a low dose but higher dose caused depression.   Escitalopram Oxalate Other (See Comments)    Causes tremors.    ROS See HPI    Objective:    Physical Exam Constitutional:      General: She is not in acute distress.    Appearance: Normal appearance. She is well-developed.  HENT:     Head: Normocephalic and atraumatic.     Right Ear: External ear normal.     Left Ear: External ear normal.  Eyes:     General: No scleral icterus. Neck:     Thyroid: No thyromegaly.  Cardiovascular:     Rate and Rhythm: Normal rate and regular rhythm.     Heart sounds: Normal heart sounds. No murmur heard. Pulmonary:     Effort: Pulmonary effort is normal. No respiratory distress.     Breath sounds: Normal breath sounds. No wheezing.  Musculoskeletal:     Cervical back: Neck supple.  Skin:    General: Skin is warm and dry.  Neurological:     Mental Status: She is alert and oriented to person, place, and time.  Psychiatric:        Mood and Affect: Mood normal.        Behavior: Behavior normal.        Thought Content: Thought content normal.        Judgment: Judgment normal.      BP 100/65 (BP Location: Right Arm, Patient Position: Sitting, Cuff Size: Normal)   Pulse 62   Temp (!) 97.5 F (36.4 C) (Oral)   Resp 16   Ht 5\' 6"  (1.676 m)   Wt 179 lb (81.2 kg)   SpO2 98%   BMI 28.89  kg/m  Wt Readings from Last 3 Encounters:  10/21/23 179 lb (81.2 kg)  04/30/23 173 lb (78.5 kg)  04/06/23 176 lb 1.9 oz (79.9 kg)       Assessment & Plan:   Problem List Items Addressed This Visit       Unprioritized   Pulmonary nodules/lesions, multiple   Last CT was stable, no follow up indicatd.        Depression with anxiety - Primary   Stable on wellbutrin xl, continue same.       Relevant Medications   buPROPion (WELLBUTRIN XL) 150 MG 24 hr tablet   Attention deficit disorder   Fair control on focalin xr 25mg   Uses focalin 5mg  in the afternoon.  Notes improvement in PM focus if she takes the PM dose. Controlled substance contract is updated.  Will update UDS.      Relevant Medications   Dexmethylphenidate HCl 25 MG CP24   dexmethylphenidate (FOCALIN) 5 MG tablet   Other Relevant Orders   DRUG MONITORING, PANEL 8 WITH CONFIRMATION, URINE   Asthma   Exercised induced only- no longer bothering her.       Other Visit Diagnoses       Hyperthyroidism       Relevant Medications   levothyroxine (SYNTHROID) 25 MCG tablet   Other Relevant Orders   TSH   Comp Met (CMET)     Breast cancer screening by mammogram       Relevant Orders   MM 3D SCREENING MAMMOGRAM BILATERAL  BREAST     Thrombocytopenia (HCC)       Relevant Orders   CBC w/Diff       I have discontinued Verlon Au P. Dolezal's amoxicillin-clavulanate and fluconazole. I am also having her maintain her fexofenadine, Probiotic Product (PROBIOTIC DAILY PO), fluticasone, nitrofurantoin (macrocrystal-monohydrate), progesterone, levothyroxine, buPROPion, Dexmethylphenidate HCl, and dexmethylphenidate.  Meds ordered this encounter  Medications   levothyroxine (SYNTHROID) 25 MCG tablet    Sig: Take 1 tablet (25 mcg total) by mouth daily.    Dispense:  90 tablet    Refill:  1    Supervising Provider:   Danise Edge A [[id number]]   buPROPion (WELLBUTRIN XL) 150 MG 24 hr tablet    Sig: Take 1 tablet (150 mg total) by  mouth daily.    Dispense:  90 tablet    Refill:  1    Supervising Provider:   Danise Edge A [[id number]]   Dexmethylphenidate HCl 25 MG CP24    Sig: Take 1 capsule (25 mg total) by mouth daily at 6 (six) AM.    Dispense:  30 capsule    Refill:  0    Supervising Provider:   Danise Edge A [[id number]]   dexmethylphenidate (FOCALIN) 5 MG tablet    Sig: Take 1 tablet (5 mg total) by mouth daily in the afternoon.    Dispense:  30 tablet    Refill:  0    Supervising Provider:   Danise Edge A [[id number]]

## 2023-10-21 NOTE — Assessment & Plan Note (Signed)
 Exercised induced only- no longer bothering her.

## 2023-10-21 NOTE — Assessment & Plan Note (Addendum)
 Fair control on focalin xr 25mg   Uses focalin 5mg  in the afternoon.  Notes improvement in PM focus if she takes the PM dose. Controlled substance contract is updated.  Will update UDS.

## 2023-10-21 NOTE — Assessment & Plan Note (Signed)
 Lab Results  Component Value Date   TSH 3.10 03/11/2023   On synthroid,

## 2023-10-21 NOTE — Telephone Encounter (Signed)
 Appt today at 9:40am

## 2023-10-21 NOTE — Assessment & Plan Note (Signed)
 Last CT was stable, no follow up indicatd.

## 2023-10-21 NOTE — Assessment & Plan Note (Signed)
 Stable on wellbutrin xl, continue same.

## 2023-10-22 ENCOUNTER — Telehealth: Payer: Self-pay | Admitting: Family

## 2023-10-22 LAB — DRUG MONITORING, PANEL 8 WITH CONFIRMATION, URINE
6 Acetylmorphine: NEGATIVE ng/mL (ref <10)
Alcohol Metabolites: NEGATIVE ng/mL (ref <500)
Amphetamines: NEGATIVE ng/mL (ref <500)
Benzodiazepines: NEGATIVE ng/mL (ref <100)
Buprenorphine, Urine: NEGATIVE ng/mL (ref <5)
Cocaine Metabolite: NEGATIVE ng/mL (ref <150)
Creatinine: 11.4 mg/dL — ABNORMAL LOW (ref >=20.0)
MDMA: NEGATIVE ng/mL (ref <500)
Marijuana Metabolite: NEGATIVE ng/mL (ref <20)
Opiates: NEGATIVE ng/mL (ref <100)
Oxidant: NEGATIVE ug/mL (ref <200)
Oxycodone: NEGATIVE ng/mL (ref <100)
Specific Gravity: 1.002 — ABNORMAL LOW (ref >=1.003)
pH: 7.1 (ref 4.5–9.0)

## 2023-10-22 LAB — DM TEMPLATE

## 2023-10-22 NOTE — Telephone Encounter (Signed)
 Opened in error

## 2023-10-22 NOTE — Telephone Encounter (Signed)
 See mychart.

## 2023-10-28 LAB — COLOGUARD: COLOGUARD: NEGATIVE

## 2023-10-29 ENCOUNTER — Telehealth: Payer: Self-pay

## 2023-10-29 NOTE — Telephone Encounter (Signed)
 Received call from [facility]- needing verbal to change manufacturer of levothyroxine. Verbal order given.

## 2023-11-01 ENCOUNTER — Encounter: Payer: Self-pay | Admitting: Family

## 2023-11-22 ENCOUNTER — Other Ambulatory Visit: Payer: Self-pay | Admitting: Family

## 2023-11-22 DIAGNOSIS — F909 Attention-deficit hyperactivity disorder, unspecified type: Secondary | ICD-10-CM

## 2023-11-23 MED ORDER — DEXMETHYLPHENIDATE HCL ER 25 MG PO CP24: 1.0000 | ORAL_CAPSULE | Freq: Every day | ORAL | 0 refills | Status: DC

## 2023-11-23 MED ORDER — DEXMETHYLPHENIDATE HCL 5 MG PO TABS: 5.0000 mg | ORAL_TABLET | Freq: Every day | ORAL | 0 refills | Status: DC

## 2023-11-23 NOTE — Telephone Encounter (Signed)
 Requesting: Dexmethylphenidate HCL 25 mg Contract: 10/21/2023 UDS: 10/21/2023 Last Visit: 10/21/2023 Next Visit: N/A Last Refill: 10/21/2023  Please Advise

## 2023-11-27 ENCOUNTER — Ambulatory Visit: Payer: PRIVATE HEALTH INSURANCE | Admitting: Psychology

## 2023-11-27 DIAGNOSIS — F331 Major depressive disorder, recurrent, moderate: Secondary | ICD-10-CM

## 2023-11-27 NOTE — Progress Notes (Signed)
 [facility] Counselor/Therapist Progress Note  Patient ID: DENE LANDSBERG, MRN: [id number],    Date: 11/27/2023  Time Spent: 9:00am-9:55am    55 minutes   Treatment Type: Individual Therapy  Reported Symptoms: stress  Mental Status Exam: Appearance:  Casual     Behavior: Appropriate  Motor: Normal  Speech/Language:  Normal Rate  Affect: Appropriate  Mood: normal  Thought process: normal  Thought content:   WNL  Sensory/Perceptual disturbances:   WNL  Orientation: oriented to person, place, time/date, and situation  Attention: Good  Concentration: Good  Memory: WNL  Fund of knowledge:  Good  Insight:   Good  Judgment:  Good  Impulse Control: Good   Risk Assessment: Danger to Self:  No Self-injurious Behavior: No Danger to Others: No Duty to Warn:no Physical Aggression / Violence:No  Access to Firearms a concern: No  Gang Involvement:No   Subjective: Pt present for face-to-face individual therapy via video.  Pt consents to telehealth video session and is aware of limitations and benefits of virtual sessions.   Location of pt: home Location of therapist: home office.   Pt talked about planning to move into her camper this weekend.  Pt is glad she will be able to get out of the house her daughter Maralyn Sago is living in bc they have not been getting along.  They have had some arguments about the house bc pt owns the house but her daughter acts like it is her house.  Addressed their arguments and how they impacted pt.   Helped pt process her feelings and relationship dynamics.  Pt states Maralyn Sago is often in a bad mood and "hateful".  Pt has been direct and confronting Maralyn Sago bc she has acted so entitled.  Pt talked about work.  Pt has had a problem with a Education administrator.  They have had some conflict.  Addressed the incidents and how they impacted pt.   Helped pt process work dynamics.     Worked on self care strategies. Provided supportive therapy.    Interventions:  Cognitive Behavioral Therapy and Insight-Oriented  Diagnosis:  F33.1  Plan of Care: Recommend ongoing therapy.   Pt participated in setting treatment goals.   Plan to meet monthly.  Pt agrees with treatment plan.   Treatment Plan Client Abilities/Strengths  Pt is bright, engaging, and motivated for therapy.   Client Treatment Preferences  Individual therapy.  Client Statement of Needs  Improve coping skills.  Symptoms  Depressed or irritable mood.  Problems Addressed  Unipolar Depression Goals 1. Alleviate depressive symptoms and return to previous level of effective functioning. 2. Appropriately grieve the loss in order to normalize mood and to return to previously adaptive level of functioning. Objective Learn and implement behavioral strategies to overcome depression. Target Date: 2024-06-30 Frequency: Monthly  Progress: 55 Modality: individual  Related Interventions Engage the client in "behavioral activation," increasing his/her activity level and contact with sources of reward, while identifying processes that inhibit activation.  Use behavioral techniques such as instruction, rehearsal, role-playing, role reversal, as needed, to facilitate activity in the client's daily life; reinforce success. Assist the client in developing skills that increase the likelihood of deriving pleasure from behavioral activation (e.g., assertiveness skills, developing an exercise plan, less internal/more external focus, increased social involvement); reinforce success. Objective Identify important people in life, past and present, and describe the quality, good and poor, of those relationships. Target Date: 2024-06-30 Frequency: Monthly  Progress: 55 Modality: individual  Related Interventions Conduct Interpersonal Therapy beginning  with the assessment of the client's "interpersonal inventory" of important past and present relationships; develop a case formulation linking depression to grief,  interpersonal role disputes, role transitions, and/or interpersonal deficits). Objective Learn and implement problem-solving and decision-making skills. Target Date: 2024-06-30 Frequency: Monthly  Progress: 55 Modality: individual  Related Interventions Conduct Problem-Solving Therapy using techniques such as psychoeducation, modeling, and role-playing to teach client problem-solving skills (i.e., defining a problem specifically, generating possible solutions, evaluating the pros and cons of each solution, selecting and implementing a plan of action, evaluating the efficacy of the plan, accepting or revising the plan); role-play application of the problem-solving skill to a real life issue. Encourage in the client the development of a positive problem orientation in which problems and solving them are viewed as a natural part of life and not something to be feared, despaired, or avoided. 3. Develop healthy interpersonal relationships that lead to the alleviation and help prevent the relapse of depression. 4. Develop healthy thinking patterns and beliefs about self, others, and the world that lead to the alleviation and help prevent the relapse of depression. 5. Recognize, accept, and cope with feelings of depression. Diagnosis F33.1  Conditions For Discharge Achievement of treatment goals and objectives   Salomon Fick, LCSW

## 2023-12-26 ENCOUNTER — Other Ambulatory Visit: Payer: Self-pay | Admitting: Family

## 2023-12-26 DIAGNOSIS — F909 Attention-deficit hyperactivity disorder, unspecified type: Secondary | ICD-10-CM

## 2023-12-28 ENCOUNTER — Ambulatory Visit (INDEPENDENT_AMBULATORY_CARE_PROVIDER_SITE_OTHER): Payer: PRIVATE HEALTH INSURANCE | Admitting: Psychology

## 2023-12-28 ENCOUNTER — Ambulatory Visit: Payer: PRIVATE HEALTH INSURANCE | Admitting: Psychology

## 2023-12-28 DIAGNOSIS — F331 Major depressive disorder, recurrent, moderate: Secondary | ICD-10-CM | POA: Diagnosis not present

## 2023-12-28 NOTE — Progress Notes (Signed)
 [facility] Counselor/Therapist Progress Note  Patient ID: [name], MRN: [id number],    Date: 12/28/2023  Time Spent: 5:00pm-5:55pm    55 minutes   Treatment Type: Individual Therapy  Reported Symptoms: stress  Mental Status Exam: Appearance:  Casual     Behavior: Appropriate  Motor: Normal  Speech/Language:  Normal Rate  Affect: Appropriate  Mood: normal  Thought process: normal  Thought content:   WNL  Sensory/Perceptual disturbances:   WNL  Orientation: oriented to person, place, time/date, and situation  Attention: Good  Concentration: Good  Memory: WNL  Fund of knowledge:  Good  Insight:   Good  Judgment:  Good  Impulse Control: Good   Risk Assessment: Danger to Self:  No Self-injurious Behavior: No Danger to Others: No Duty to Warn:no Physical Aggression / Violence:No  Access to Firearms a concern: No  Gang Involvement:No   Subjective: Pt present for face-to-face individual therapy via video.  Pt consents to telehealth video session and is aware of limitations and benefits of virtual sessions.   Location of pt: home Location of therapist: home office.   Pt talked about moving into her camper.  There have been some challenges bc her sink broke.   Pt is working on getting moved in but it is slow going. Pt talked about concerns about her dog who had a bad reaction to anesthesia after dental procedure.   Pt was very worried and had to take her dog back to the vet twice.  She is ok now.   Pt's dogs are like children to her.  Pt talked about work.  Pt had conflict with one of the doctors.   Addressed the conflict and how it impacted pt.  She felt disrespected by the doctor and she confronted him and set a boundary.  Helped pt process the experience and work dynamics.    Pt works three 12 hour shifts a week and is not happy with her job.  She is starting a new part time job at [employer].   Worked on self care strategies. Provided supportive  therapy.    Interventions: Cognitive Behavioral Therapy and Insight-Oriented  Diagnosis:  F33.1  Plan of Care: Recommend ongoing therapy.   Pt participated in setting treatment goals.   Plan to meet monthly.  Pt agrees with treatment plan.   Treatment Plan Client Abilities/Strengths  Pt is bright, engaging, and motivated for therapy.   Client Treatment Preferences  Individual therapy.  Client Statement of Needs  Improve coping skills.  Symptoms  Depressed or irritable mood.  Problems Addressed  Unipolar Depression Goals 1. Alleviate depressive symptoms and return to previous level of effective functioning. 2. Appropriately grieve the loss in order to normalize mood and to return to previously adaptive level of functioning. Objective Learn and implement behavioral strategies to overcome depression. Target Date: 2024-06-30 Frequency: Monthly  Progress: 55 Modality: individual  Related Interventions Engage the client in "behavioral activation," increasing his/her activity level and contact with sources of reward, while identifying processes that inhibit activation.  Use behavioral techniques such as instruction, rehearsal, role-playing, role reversal, as needed, to facilitate activity in the client's daily life; reinforce success. Assist the client in developing skills that increase the likelihood of deriving pleasure from behavioral activation (e.g., assertiveness skills, developing an exercise plan, less internal/more external focus, increased social involvement); reinforce success. Objective Identify important people in life, past and present, and describe the quality, good and poor, of those relationships. Target Date: 2024-06-30 Frequency:  Monthly  Progress: 55 Modality: individual  Related Interventions Conduct Interpersonal Therapy beginning with the assessment of the client's "interpersonal inventory" of important past and present relationships; develop a case formulation  linking depression to grief, interpersonal role disputes, role transitions, and/or interpersonal deficits). Objective Learn and implement problem-solving and decision-making skills. Target Date: 2024-06-30 Frequency: Monthly  Progress: 55 Modality: individual  Related Interventions Conduct Problem-Solving Therapy using techniques such as psychoeducation, modeling, and role-playing to teach client problem-solving skills (i.e., defining a problem specifically, generating possible solutions, evaluating the pros and cons of each solution, selecting and implementing a plan of action, evaluating the efficacy of the plan, accepting or revising the plan); role-play application of the problem-solving skill to a real life issue. Encourage in the client the development of a positive problem orientation in which problems and solving them are viewed as a natural part of life and not something to be feared, despaired, or avoided. 3. Develop healthy interpersonal relationships that lead to the alleviation and help prevent the relapse of depression. 4. Develop healthy thinking patterns and beliefs about self, others, and the world that lead to the alleviation and help prevent the relapse of depression. 5. Recognize, accept, and cope with feelings of depression. Diagnosis F33.1  Conditions For Discharge Achievement of treatment goals and objectives   Willey Harrier, LCSW

## 2023-12-28 NOTE — Progress Notes (Unsigned)
   Severo Beber, LCSW

## 2023-12-28 NOTE — Telephone Encounter (Signed)
 Requesting: Focalin  25mg  and 5mg   Contract: 10/21/23 UDS: 10/21/23 Last Visit: 10/21/23 Next Visit: None Last Refill: 11/23/23 #30 and 0RF (x2)   Please Advise

## 2023-12-29 MED ORDER — DEXMETHYLPHENIDATE HCL 5 MG PO TABS: 5.0000 mg | ORAL_TABLET | Freq: Every day | ORAL | 0 refills | Status: DC

## 2023-12-29 MED ORDER — DEXMETHYLPHENIDATE HCL ER 25 MG PO CP24: 1.0000 | ORAL_CAPSULE | Freq: Every day | ORAL | 0 refills | Status: DC

## 2024-01-25 ENCOUNTER — Ambulatory Visit: Payer: PRIVATE HEALTH INSURANCE | Admitting: Psychology

## 2024-01-25 DIAGNOSIS — F331 Major depressive disorder, recurrent, moderate: Secondary | ICD-10-CM | POA: Diagnosis not present

## 2024-01-25 NOTE — Progress Notes (Signed)
 [facility] Counselor/Therapist Progress Note  Patient ID: [name], MRN: [id number],    Date: 01/25/2024  Time Spent: 4:00pm-4:55pm    55 minutes   Treatment Type: Individual Therapy  Reported Symptoms: stress  Mental Status Exam: Appearance:  Casual     Behavior: Appropriate  Motor: Normal  Speech/Language:  Normal Rate  Affect: Appropriate  Mood: normal  Thought process: normal  Thought content:   WNL  Sensory/Perceptual disturbances:   WNL  Orientation: oriented to person, place, time/date, and situation  Attention: Good  Concentration: Good  Memory: WNL  Fund of knowledge:  Good  Insight:   Good  Judgment:  Good  Impulse Control: Good   Risk Assessment: Danger to Self:  No Self-injurious Behavior: No Danger to Others: No Duty to Warn:no Physical Aggression / Violence:No  Access to Firearms a concern: No  Gang Involvement:No   Subjective: Pt present for face-to-face individual therapy via video.  Pt consents to telehealth video session and is aware of limitations and benefits of virtual sessions.   Location of pt: home Location of therapist: home office.   Pt talked about her camper.  She is still not moved in bc the sink needs to be fixed.  Pt talked about her health.  She got more blood work done and the results were worse.  Pt sent a message to her provider and has not heard back which is very frustrating.  Addressed pt's concerns about her health.  Pt talked about work.  She feels some of the coworkers are lazy and don't do their share of work.  There have also been times when the staff are not doing things the right way.   Pt tends to confront coworkers and finds herself in conflict.   Addressed how pt can "chose her battles" and make choices to let go of some things.  Helped pt process the work dynamics.     Worked on self care strategies. Provided supportive therapy.    Interventions: Cognitive Behavioral Therapy and  Insight-Oriented  Diagnosis:  F33.1  Plan of Care: Recommend ongoing therapy.   Pt participated in setting treatment goals.   Plan to meet monthly.  Pt agrees with treatment plan.   Treatment Plan Client Abilities/Strengths  Pt is bright, engaging, and motivated for therapy.   Client Treatment Preferences  Individual therapy.  Client Statement of Needs  Improve coping skills.  Symptoms  Depressed or irritable mood.  Problems Addressed  Unipolar Depression Goals 1. Alleviate depressive symptoms and return to previous level of effective functioning. 2. Appropriately grieve the loss in order to normalize mood and to return to previously adaptive level of functioning. Objective Learn and implement behavioral strategies to overcome depression. Target Date: 2024-06-30 Frequency: Monthly  Progress: 55 Modality: individual  Related Interventions Engage the client in "behavioral activation," increasing his/her activity level and contact with sources of reward, while identifying processes that inhibit activation.  Use behavioral techniques such as instruction, rehearsal, role-playing, role reversal, as needed, to facilitate activity in the client's daily life; reinforce success. Assist the client in developing skills that increase the likelihood of deriving pleasure from behavioral activation (e.g., assertiveness skills, developing an exercise plan, less internal/more external focus, increased social involvement); reinforce success. Objective Identify important people in life, past and present, and describe the quality, good and poor, of those relationships. Target Date: 2024-06-30 Frequency: Monthly  Progress: 55 Modality: individual  Related Interventions Conduct Interpersonal Therapy beginning with the assessment of the client's "interpersonal inventory"  of important past and present relationships; develop a case formulation linking depression to grief, interpersonal role disputes, role  transitions, and/or interpersonal deficits). Objective Learn and implement problem-solving and decision-making skills. Target Date: 2024-06-30 Frequency: Monthly  Progress: 55 Modality: individual  Related Interventions Conduct Problem-Solving Therapy using techniques such as psychoeducation, modeling, and role-playing to teach client problem-solving skills (i.e., defining a problem specifically, generating possible solutions, evaluating the pros and cons of each solution, selecting and implementing a plan of action, evaluating the efficacy of the plan, accepting or revising the plan); role-play application of the problem-solving skill to a real life issue. Encourage in the client the development of a positive problem orientation in which problems and solving them are viewed as a natural part of life and not something to be feared, despaired, or avoided. 3. Develop healthy interpersonal relationships that lead to the alleviation and help prevent the relapse of depression. 4. Develop healthy thinking patterns and beliefs about self, others, and the world that lead to the alleviation and help prevent the relapse of depression. 5. Recognize, accept, and cope with feelings of depression. Diagnosis F33.1  Conditions For Discharge Achievement of treatment goals and objectives   Willey Harrier, LCSW

## 2024-01-31 ENCOUNTER — Encounter: Payer: Self-pay | Admitting: Family

## 2024-01-31 ENCOUNTER — Other Ambulatory Visit: Payer: Self-pay | Admitting: Family

## 2024-01-31 DIAGNOSIS — F909 Attention-deficit hyperactivity disorder, unspecified type: Secondary | ICD-10-CM

## 2024-02-02 NOTE — Telephone Encounter (Signed)
 See my chart message

## 2024-03-04 ENCOUNTER — Other Ambulatory Visit: Payer: Self-pay | Admitting: Family

## 2024-03-04 ENCOUNTER — Encounter: Payer: Self-pay | Admitting: Family

## 2024-03-04 DIAGNOSIS — F909 Attention-deficit hyperactivity disorder, unspecified type: Secondary | ICD-10-CM

## 2024-03-23 ENCOUNTER — Ambulatory Visit: Payer: PRIVATE HEALTH INSURANCE | Admitting: Psychology

## 2024-03-23 DIAGNOSIS — F331 Major depressive disorder, recurrent, moderate: Secondary | ICD-10-CM

## 2024-03-23 NOTE — Progress Notes (Signed)
 [facility] Counselor/Therapist Progress Note  Patient ID: [name], MRN: [id number],    Date: 03/23/2024  Time Spent: 11:00am-11:55am    55 minutes   Treatment Type: Individual Therapy  Reported Symptoms: stress  Mental Status Exam: Appearance:  Casual     Behavior: Appropriate  Motor: Normal  Speech/Language:  Normal Rate  Affect: Appropriate  Mood: normal  Thought process: normal  Thought content:   WNL  Sensory/Perceptual disturbances:   WNL  Orientation: oriented to person, place, time/date, and situation  Attention: Good  Concentration: Good  Memory: WNL  Fund of knowledge:  Good  Insight:   Good  Judgment:  Good  Impulse Control: Good   Risk Assessment: Danger to Self:  No Self-injurious Behavior: No Danger to Others: No Duty to Warn:no Physical Aggression / Violence:No  Access to Firearms a concern: No  Gang Involvement:No   Subjective: Pt present for face-to-face individual therapy via video.  Pt consents to telehealth video session and is aware of limitations and benefits of virtual sessions.   Location of pt: home Location of therapist: home office.   Pt talked about her camper.  She is still not moved in bc there has been one issue after another.   Pt hopes to move in a couple of weeks.  Addressed how frustrating the issues have been to deal with.  There have been a lot of repairs needed.   Pt talked about her health.   She has a lot of inflamation and is in pain.  Addressed pt's concerns about her health.  Pt talked about work.  She feels like it is a toxic work environment. Addressed the work dynamics and how they impact pt. There is an Estate agent who has been very aggressive to pt.   Pt has been scared of him and has reported him but management has not done anything about the situation.  Helped pt process  her feelings and the work dynamics.     Pt is starting a new part time job on Saturday.  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   Pt wants to work on setting healthy boundaries and expectations in relationship and at work.  Recommended pt get the book Let Them.   Worked on self care strategies. Provided supportive therapy.    Interventions: Cognitive Behavioral Therapy and Insight-Oriented  Diagnosis:  F33.1  Plan of Care: Recommend ongoing therapy.   Pt participated in setting treatment goals.   Plan to meet monthly.  Pt agrees with treatment plan.   Treatment Plan Client Abilities/Strengths  Pt is bright, engaging, and motivated for therapy.   Client Treatment Preferences  Individual therapy.  Client Statement of Needs  Improve coping skills.  Symptoms  Depressed or irritable mood.  Problems Addressed  Unipolar Depression Goals 1. Alleviate depressive symptoms and return to previous level of effective functioning. 2. Appropriately grieve the loss in order to normalize mood and to return to previously adaptive level of functioning. Objective Learn and implement behavioral strategies to overcome depression. Target Date: 2024-06-30 Frequency: Monthly  Progress: 55 Modality: individual  Related Interventions Engage the client in behavioral activation, increasing his/her activity level and contact with sources of reward, while identifying processes that inhibit activation.  Use behavioral techniques such as instruction, rehearsal, role-playing, role reversal, as needed, to facilitate activity in the client's daily life; reinforce success. Assist the client in developing skills that increase the likelihood of deriving pleasure from behavioral activation (e.g., assertiveness skills, developing  an exercise plan, less internal/more external focus, increased social involvement); reinforce success. Objective Identify important people in life, past and present, and describe the quality, good and poor, of those relationships. Target Date: 2024-06-30 Frequency: Monthly  Progress: 55 Modality: individual   Related Interventions Conduct Interpersonal Therapy beginning with the assessment of the client's interpersonal inventory of important past and present relationships; develop a case formulation linking depression to grief, interpersonal role disputes, role transitions, and/or interpersonal deficits). Objective Learn and implement problem-solving and decision-making skills. Target Date: 2024-06-30 Frequency: Monthly  Progress: 55 Modality: individual  Related Interventions Conduct Problem-Solving Therapy using techniques such as psychoeducation, modeling, and role-playing to teach client problem-solving skills (i.e., defining a problem specifically, generating possible solutions, evaluating the pros and cons of each solution, selecting and implementing a plan of action, evaluating the efficacy of the plan, accepting or revising the plan); role-play application of the problem-solving skill to a real life issue. Encourage in the client the development of a positive problem orientation in which problems and solving them are viewed as a natural part of life and not something to be feared, despaired, or avoided. 3. Develop healthy interpersonal relationships that lead to the alleviation and help prevent the relapse of depression. 4. Develop healthy thinking patterns and beliefs about self, others, and the world that lead to the alleviation and help prevent the relapse of depression. 5. Recognize, accept, and cope with feelings of depression. Diagnosis F33.1  Conditions For Discharge Achievement of treatment goals and objectives   Veva Alma, LCSW

## 2024-04-03 ENCOUNTER — Other Ambulatory Visit: Payer: Self-pay | Admitting: Family

## 2024-04-03 DIAGNOSIS — F909 Attention-deficit hyperactivity disorder, unspecified type: Secondary | ICD-10-CM

## 2024-04-04 NOTE — Telephone Encounter (Signed)
 Please call patient to schedule a follow up visit.

## 2024-04-05 ENCOUNTER — Encounter: Payer: Self-pay | Admitting: Family

## 2024-04-05 DIAGNOSIS — F909 Attention-deficit hyperactivity disorder, unspecified type: Secondary | ICD-10-CM

## 2024-04-06 MED ORDER — DEXMETHYLPHENIDATE HCL ER 25 MG PO CP24: 1.0000 | ORAL_CAPSULE | Freq: Every day | ORAL | 0 refills | Status: DC

## 2024-04-06 MED ORDER — DEXMETHYLPHENIDATE HCL 5 MG PO TABS: 5.0000 mg | ORAL_TABLET | Freq: Every day | ORAL | 0 refills | Status: AC

## 2024-04-15 ENCOUNTER — Ambulatory Visit: Payer: PRIVATE HEALTH INSURANCE | Admitting: Family

## 2024-04-20 ENCOUNTER — Ambulatory Visit: Payer: PRIVATE HEALTH INSURANCE | Admitting: Family

## 2024-04-20 ENCOUNTER — Encounter: Payer: Self-pay | Admitting: Family

## 2024-04-20 VITALS — BP 116/70 | HR 70 | Temp 98.6°F | Resp 16 | Ht 66.0 in | Wt 176.0 lb

## 2024-04-20 DIAGNOSIS — F418 Other specified anxiety disorders: Secondary | ICD-10-CM

## 2024-04-20 DIAGNOSIS — Z23 Encounter for immunization: Secondary | ICD-10-CM

## 2024-04-20 DIAGNOSIS — E039 Hypothyroidism, unspecified: Secondary | ICD-10-CM

## 2024-04-20 DIAGNOSIS — F909 Attention-deficit hyperactivity disorder, unspecified type: Secondary | ICD-10-CM

## 2024-04-20 NOTE — Patient Instructions (Addendum)
 VISIT SUMMARY:  Today, we reviewed your management of Hashimoto's thyroiditis, ADHD, and major depressive disorder. We also discussed your general health maintenance, including vaccinations and upcoming screenings.  YOUR PLAN:  THYROID  DISEASE: Your condition is well-managed, and your TSH levels were normal in March. -We will order a TSH test to update your thyroid  function status.  ATTENTION-DEFICIT HYPERACTIVITY DISORDER (ADHD): You have been managing your ADHD with Focalin  and recently adjusted your dosage. -Monitor your symptoms and let us  know if you need to restart the afternoon dose of Focalin .  DEPRESSION Your mood is stable with Wellbutrin , and you are considering discontinuing it in the future. -Continue taking Wellbutrin  as prescribed.  GENERAL HEALTH MAINTENANCE: You are due for some important health screenings and vaccinations. -We will administer the pneumonia vaccine today. -Schedule your mammogram for October.

## 2024-04-20 NOTE — Assessment & Plan Note (Addendum)
 Continues focalin . Doing OK without the afternoon dose.  Contract up to date.

## 2024-04-20 NOTE — Assessment & Plan Note (Signed)
 Lab Results  Component Value Date   TSH 4.30 10/21/2023   Clinically stable on synthroid  25 mcg.  Update TSH.

## 2024-04-20 NOTE — Progress Notes (Signed)
 Subjective:     Patient ID: [name], female    DOB: 07-Jan-1971, 53 y.o.   MRN: [id number]  Chief Complaint  Patient presents with   ADHD    Here for follow up    HPI  Discussed the use of AI scribe software for clinical note transcription with the patient, who gave verbal consent to proceed.  History of Present Illness  [name] is a 53 year old female with Hashimoto's thyroiditis who presents for a follow-up on medications.  She has been managing her Hashimoto's thyroiditis and recently tried a new drink from her support group. She stopped her afternoon dose of Focalin  for two weeks, except for one day when she experienced 'brain fog' and took a 5 mg dose. Last Monday, she had a severe headache while driving to work, leading her to take the week off and not take any medications. She resumed work yesterday and felt an 'afternoon slug' without her energy drink, prompting her to take a 5 mg dose of Focalin .  Her CBC on June 30 showed a hemoglobin level of 12.5, within the normal range, but her white blood cell count remains chronically slightly low.  She takes Wellbutrin  for mood stabilization and finds it effective, though she occasionally forgets her dose, especially on days off. Her mood is influenced by physical activity levels and inflammation, affecting her ability to engage in enjoyable activities.  She no longer uses an inhaler for asthma, as it worsened her symptoms during a PFT test.      Health Maintenance Due  Topic Date Due   Zoster Vaccines- Shingrix (1 of 2) Never done   MAMMOGRAM  10/22/2023   INFLUENZA VACCINE  03/18/2024   COVID-19 Vaccine (4 - 2025-26 season) 04/18/2024   Cervical Cancer Screening (HPV/Pap Cotest)  05/31/2024    Past Medical History:  Diagnosis Date   ADD (attention deficit disorder)    Adjustment disorder    without depression   Allergy    allergic rhinitis   Asthma    hx of exercised induced   Depression     Endometriosis    Former smoker    SVD (spontaneous vaginal delivery)    x 1   Thyroid  disease    hyperthyroidism / Graves Disease - no treatment needed for years per patient   Unilateral primary osteoarthritis, right hip 07/17/2020    Past Surgical History:  Procedure Laterality Date   AUGMENTATION MAMMAPLASTY     BREAST BIOPSY Bilateral 2016   BREAST SURGERY  2004   augmentation   DILITATION & CURRETTAGE/HYSTROSCOPY WITH HYDROTHERMAL ABLATION N/A 12/21/2014   Procedure: DILATATION & CURETTAGE/HYSTEROSCOPY WITH HYDROTHERMAL ABLATION;  Surgeon: Rosaline Cobble, MD;  Location: [facility];  Service: Gynecology;  Laterality: N/A;   LAPAROSCOPIC TUBAL LIGATION Bilateral 12/21/2014   Procedure: LAPAROSCOPIC TUBAL LIGATION;  Surgeon: Rosaline Cobble, MD;  Location: [facility];  Service: Gynecology;  Laterality: Bilateral;   right ankle surgery  2002   TOTAL HIP ARTHROPLASTY Right 09/21/2020   Procedure: RIGHT TOTAL HIP ARTHROPLASTY ANTERIOR APPROACH;  Surgeon: Vernetta Lonni GRADE, MD;  Location: [facility];  Service: Orthopedics;  Laterality: Right;   TUBAL LIGATION  12/21/14   WISDOM TOOTH EXTRACTION      Family History  Problem Relation Age of Onset   Diabetes Other    Stroke Other    Hypertension Other    Thyroid  disease Other    Migraines Other    Breast cancer Maternal Aunt  Social History   Socioeconomic History   Marital status: Single    Spouse name: Not on file   Number of children: 1   Years of education: Not on file   Highest education level: Bachelor's degree (e.g., BA, AB, BS)  Occupational History   Occupation: Teacher, adult education: [employer]  Tobacco Use   Smoking status: Former    Current packs/day: 0.00    Average packs/day: 0.5 packs/day for 15.0 years (7.5 ttl pk-yrs)    Types: Cigarettes    Start date: 07/18/1996    Quit date: 07/19/2011    Years since quitting: 12.7   Smokeless tobacco: Never  Vaping Use   Vaping status: Never Used  Substance and Sexual  Activity   Alcohol use: Not Currently    Comment: socially   Drug use: No   Sexual activity: Yes    Birth control/protection: Surgical    Comment: on loestrin   Other Topics Concern   Not on file  Social History Narrative   Works as an Charity fundraiser   Social Drivers of Corporate investment banker Strain: Low Risk  (02/28/2023)   Overall Financial Resource Strain (CARDIA)    Difficulty of Paying Living Expenses: Not hard at all  Food Insecurity: No Food Insecurity (02/28/2023)   Hunger Vital Sign    Worried About Running Out of Food in the Last Year: Never true    Ran Out of Food in the Last Year: Never true  Transportation Needs: No Transportation Needs (02/28/2023)   PRAPARE - Administrator, Civil Service (Medical): No    Lack of Transportation (Non-Medical): No  Physical Activity: Sufficiently Active (02/28/2023)   Exercise Vital Sign    Days of Exercise per Week: 4 days    Minutes of Exercise per Session: 60 min  Stress: No Stress Concern Present (02/28/2023)   [employer] of Occupational Health - Occupational Stress Questionnaire    Feeling of Stress : Not at all  Social Connections: Socially Isolated (02/28/2023)   Social Connection and Isolation Panel    Frequency of Communication with Friends and Family: More than three times a week    Frequency of Social Gatherings with Friends and Family: Once a week    Attends Religious Services: Never    Database administrator or Organizations: No    Attends Engineer, structural: Not on file    Marital Status: Never married  Intimate Partner Violence: Not At Risk (04/06/2023)   Humiliation, Afraid, Rape, and Kick questionnaire    Fear of Current or Ex-Partner: No    Emotionally Abused: No    Physically Abused: No    Sexually Abused: No    Outpatient Medications Prior to Visit  Medication Sig Dispense Refill   buPROPion  (WELLBUTRIN  XL) 150 MG 24 hr tablet Take 1 tablet (150 mg total) by mouth daily. 90 tablet 1    dexmethylphenidate  (FOCALIN ) 5 MG tablet Take 1 tablet (5 mg total) by mouth daily in the afternoon. 30 tablet 0   Dexmethylphenidate  HCl 25 MG CP24 Take 1 capsule (25 mg total) by mouth daily. 30 capsule 0   fexofenadine (ALLEGRA) 180 MG tablet Take 180 mg by mouth daily as needed for allergies.     fluticasone  (FLONASE ) 50 MCG/ACT nasal spray Place 2 sprays into both nostrils daily. 16 g 0   levothyroxine  (SYNTHROID ) 25 MCG tablet Take 1 tablet (25 mcg total) by mouth daily. 90 tablet 1   nitrofurantoin ,  macrocrystal-monohydrate, (MACROBID ) 100 MG capsule Take 1 capsule (100 mg total) by mouth as needed. 30 capsule 3   Probiotic Product (PROBIOTIC DAILY PO) Take 1 capsule by mouth daily.     progesterone  (PROMETRIUM ) 100 MG capsule Take 100 mg by mouth at bedtime.     No facility-administered medications prior to visit.    Allergies  Allergen Reactions   Montelukast  Sodium Swelling    Causes swelling and pain in the joints.   Paroxetine Hcl Other (See Comments)    Too high of a dose makes depressed    Wellbutrin  [Bupropion ] Other (See Comments)    Did well on a low dose but higher dose caused depression.   Escitalopram  Oxalate Other (See Comments)    Causes tremors.    ROS See HPI    Objective:    Physical Exam Constitutional:      General: She is not in acute distress.    Appearance: Normal appearance. She is well-developed.  HENT:     Head: Normocephalic and atraumatic.     Right Ear: External ear normal.     Left Ear: External ear normal.  Eyes:     General: No scleral icterus. Neck:     Thyroid : No thyromegaly.  Cardiovascular:     Rate and Rhythm: Normal rate and regular rhythm.     Heart sounds: Normal heart sounds. No murmur heard. Pulmonary:     Effort: Pulmonary effort is normal. No respiratory distress.     Breath sounds: Normal breath sounds. No wheezing.  Musculoskeletal:     Cervical back: Neck supple.  Skin:    General: Skin is warm and dry.   Neurological:     Mental Status: She is alert and oriented to person, place, and time.  Psychiatric:        Mood and Affect: Mood normal.        Behavior: Behavior normal.        Thought Content: Thought content normal.        Judgment: Judgment normal.      BP 116/70 (BP Location: Right Arm, Patient Position: Sitting, Cuff Size: Normal)   Pulse 70   Temp 98.6 F (37 C) (Oral)   Resp 16   Ht 5' 6 (1.676 m)   Wt 176 lb (79.8 kg)   SpO2 98%   BMI 28.41 kg/m  Wt Readings from Last 3 Encounters:  04/20/24 176 lb (79.8 kg)  10/21/23 179 lb (81.2 kg)  04/30/23 173 lb (78.5 kg)       Assessment & Plan:   Problem List Items Addressed This Visit       Unprioritized   Hypothyroidism   Lab Results  Component Value Date   TSH 4.30 10/21/2023   Clinically stable on synthroid  25 mcg.  Update TSH.      Relevant Orders   TSH   Depression with anxiety   Mood is stable on wellbutrin , continue same.      Attention deficit disorder - Primary   Continues focalin . Doing OK without the afternoon dose.  Contract up to date.      Other Visit Diagnoses       Need for pneumococcal 20-valent conjugate vaccination       Relevant Orders   Pneumococcal conjugate vaccine 20-valent (Prevnar 20) (Completed)       I am having Lethia P. Elpers maintain her fexofenadine, Probiotic Product (PROBIOTIC DAILY PO), fluticasone , nitrofurantoin  (macrocrystal-monohydrate), progesterone , levothyroxine , buPROPion , Dexmethylphenidate  HCl, and dexmethylphenidate .  No orders  of the defined types were placed in this encounter.

## 2024-04-20 NOTE — Assessment & Plan Note (Signed)
 Mood is stable on wellbutrin , continue same.

## 2024-04-20 NOTE — Assessment & Plan Note (Deleted)
 Lab Results  Component Value Date   TSH 4.30 10/21/2023   Clinically stable on synthroid  25 mcg.  Update TSH.

## 2024-04-21 ENCOUNTER — Ambulatory Visit: Payer: Self-pay | Admitting: Family

## 2024-04-21 LAB — TSH: TSH: 4.35 u[IU]/mL (ref 0.35–5.50)

## 2024-04-28 ENCOUNTER — Ambulatory Visit (INDEPENDENT_AMBULATORY_CARE_PROVIDER_SITE_OTHER): Payer: PRIVATE HEALTH INSURANCE | Admitting: Psychology

## 2024-04-28 ENCOUNTER — Encounter: Payer: Self-pay | Admitting: Family

## 2024-04-28 DIAGNOSIS — F331 Major depressive disorder, recurrent, moderate: Secondary | ICD-10-CM

## 2024-04-28 NOTE — Progress Notes (Signed)
 [facility] Counselor/Therapist Progress Note  Patient ID: [name], MRN: [id number],    Date: 04/28/2024  Time Spent: 3:00pm-3:50pm    50 minutes   Treatment Type: Individual Therapy  Reported Symptoms: stress  Mental Status Exam: Appearance:  Casual     Behavior: Appropriate  Motor: Normal  Speech/Language:  Normal Rate  Affect: Appropriate  Mood: normal  Thought process: normal  Thought content:   WNL  Sensory/Perceptual disturbances:   WNL  Orientation: oriented to person, place, time/date, and situation  Attention: Good  Concentration: Good  Memory: WNL  Fund of knowledge:  Good  Insight:   Good  Judgment:  Good  Impulse Control: Good   Risk Assessment: Danger to Self:  No Self-injurious Behavior: No Danger to Others: No Duty to Warn:no Physical Aggression / Violence:No  Access to Firearms a concern: No  Gang Involvement:No   Subjective: Pt present for face-to-face individual therapy via video.  Pt consents to telehealth video session and is aware of limitations and benefits of virtual sessions.   Location of pt: home Location of therapist: home office.   Pt talked about her health.  She is waiting on getting blood work back.   Pt feels like her providers are not listening to her concerns.  Pt has shortness of breath on exertion and this is unusual for her.  Pt is thinking about getting a second opinion.   Pt has an appointment to get a referral for a cardiologist on the 19th.   Pt has been feeling so badly that she had to call out of work for a week.  Pt is very upset about her medical care.  Pt talked about work.  Pt has been getting feedback about being scattered an pt is upset that coworkers are talking about her and not doing their own jobs.  Addressed the work dynamics and how they impact pt.  Pt is thinking about looking for another job.  Pt talked about her camper.  She has recently moved into her camper and is adjusting.   Pt is worried  about one of her dogs who is having arthritis pain.   Worked on self care strategies. Provided supportive therapy.    Interventions: Cognitive Behavioral Therapy and Insight-Oriented  Diagnosis:  F33.1  Plan of Care: Recommend ongoing therapy.   Pt participated in setting treatment goals.   Plan to meet monthly.  Pt agrees with treatment plan.   Treatment Plan Client Abilities/Strengths  Pt is bright, engaging, and motivated for therapy.   Client Treatment Preferences  Individual therapy.  Client Statement of Needs  Improve coping skills.  Symptoms  Depressed or irritable mood.  Problems Addressed  Unipolar Depression Goals 1. Alleviate depressive symptoms and return to previous level of effective functioning. 2. Appropriately grieve the loss in order to normalize mood and to return to previously adaptive level of functioning. Objective Learn and implement behavioral strategies to overcome depression. Target Date: 2024-06-30 Frequency: Monthly  Progress: 55 Modality: individual  Related Interventions Engage the client in behavioral activation, increasing his/her activity level and contact with sources of reward, while identifying processes that inhibit activation.  Use behavioral techniques such as instruction, rehearsal, role-playing, role reversal, as needed, to facilitate activity in the client's daily life; reinforce success. Assist the client in developing skills that increase the likelihood of deriving pleasure from behavioral activation (e.g., assertiveness skills, developing an exercise plan, less internal/more external focus, increased social involvement); reinforce success. Objective Identify important people in life,  past and present, and describe the quality, good and poor, of those relationships. Target Date: 2024-06-30 Frequency: Monthly  Progress: 55 Modality: individual  Related Interventions Conduct Interpersonal Therapy beginning with the assessment of the  client's interpersonal inventory of important past and present relationships; develop a case formulation linking depression to grief, interpersonal role disputes, role transitions, and/or interpersonal deficits). Objective Learn and implement problem-solving and decision-making skills. Target Date: 2024-06-30 Frequency: Monthly  Progress: 55 Modality: individual  Related Interventions Conduct Problem-Solving Therapy using techniques such as psychoeducation, modeling, and role-playing to teach client problem-solving skills (i.e., defining a problem specifically, generating possible solutions, evaluating the pros and cons of each solution, selecting and implementing a plan of action, evaluating the efficacy of the plan, accepting or revising the plan); role-play application of the problem-solving skill to a real life issue. Encourage in the client the development of a positive problem orientation in which problems and solving them are viewed as a natural part of life and not something to be feared, despaired, or avoided. 3. Develop healthy interpersonal relationships that lead to the alleviation and help prevent the relapse of depression. 4. Develop healthy thinking patterns and beliefs about self, others, and the world that lead to the alleviation and help prevent the relapse of depression. 5. Recognize, accept, and cope with feelings of depression. Diagnosis F33.1  Conditions For Discharge Achievement of treatment goals and objectives   Darlene Alma, LCSW

## 2024-05-05 ENCOUNTER — Encounter: Payer: Self-pay | Admitting: Family

## 2024-05-06 ENCOUNTER — Ambulatory Visit: Payer: PRIVATE HEALTH INSURANCE | Admitting: Family

## 2024-05-09 ENCOUNTER — Other Ambulatory Visit: Payer: Self-pay | Admitting: Family

## 2024-05-09 DIAGNOSIS — F909 Attention-deficit hyperactivity disorder, unspecified type: Secondary | ICD-10-CM

## 2024-05-31 ENCOUNTER — Ambulatory Visit (INDEPENDENT_AMBULATORY_CARE_PROVIDER_SITE_OTHER): Payer: PRIVATE HEALTH INSURANCE | Admitting: Psychology

## 2024-05-31 DIAGNOSIS — F331 Major depressive disorder, recurrent, moderate: Secondary | ICD-10-CM

## 2024-05-31 NOTE — Progress Notes (Signed)
 [facility] Counselor/Therapist Progress Note  Patient ID: LIVIER HENDEL, MRN: [id number],    Date: 05/31/2024  Time Spent: 4:00pm-4:55pm    55 minutes   Treatment Type: Individual Therapy  Reported Symptoms: stress  Mental Status Exam: Appearance:  Casual     Behavior: Appropriate  Motor: Normal  Speech/Language:  Normal Rate  Affect: Appropriate  Mood: normal  Thought process: normal  Thought content:   WNL  Sensory/Perceptual disturbances:   WNL  Orientation: oriented to person, place, time/date, and situation  Attention: Good  Concentration: Good  Memory: WNL  Fund of knowledge:  Good  Insight:   Good  Judgment:  Good  Impulse Control: Good   Risk Assessment: Danger to Self:  No Self-injurious Behavior: No Danger to Others: No Duty to Warn:no Physical Aggression / Violence:No  Access to Firearms a concern: No  Gang Involvement:No   Subjective: Pt present for face-to-face individual therapy via video.  Pt consents to telehealth video session and is aware of limitations and benefits of virtual sessions.   Location of pt: home Location of therapist: home office.   Pt talked about still working on getting adjusted to living in her camper.  Her dogs are having trouble adjusting to the change.   Pt is worried about her dogs.  She is still trying to get everything moved into her camper but won't sleep there until she gets a new mattress.   Pt has been going on camping trips on her days off.  She enjoyed the trips but had shortness of breath when she hiked.   Pt has shortness of breath on exertion and this is unusual for her.   Pt saw her doctor and found out her blood sugar keeps dropping.  Pt is upset about how her doctors are treating her.  She feels like they are being dismissive of her symptoms and concerns.  Addressed pt's worries and frustrations.  Pt is ruminating about her medical symptoms and blood work results.   Pt talked about work. Addressed  the work dynamics and how they impact pt.  Pt is thinking about looking for another job.  Worked on Optician, dispensing.  Worked on self care strategies. Provided supportive therapy.    Interventions: Cognitive Behavioral Therapy and Insight-Oriented  Diagnosis:  F33.1  Plan of Care: Recommend ongoing therapy.   Pt participated in setting treatment goals.   Plan to meet monthly.  Pt agrees with treatment plan.   Treatment Plan Client Abilities/Strengths  Pt is bright, engaging, and motivated for therapy.   Client Treatment Preferences  Individual therapy.  Client Statement of Needs  Improve coping skills.  Symptoms  Depressed or irritable mood.  Problems Addressed  Unipolar Depression Goals 1. Alleviate depressive symptoms and return to previous level of effective functioning. 2. Appropriately grieve the loss in order to normalize mood and to return to previously adaptive level of functioning. Objective Learn and implement behavioral strategies to overcome depression. Target Date: 2024-06-30 Frequency: Monthly  Progress: 55 Modality: individual  Related Interventions Engage the client in behavioral activation, increasing his/her activity level and contact with sources of reward, while identifying processes that inhibit activation.  Use behavioral techniques such as instruction, rehearsal, role-playing, role reversal, as needed, to facilitate activity in the client's daily life; reinforce success. Assist the client in developing skills that increase the likelihood of deriving pleasure from behavioral activation (e.g., assertiveness skills, developing an exercise plan, less internal/more external focus, increased social involvement); reinforce success. Objective Identify important  people in life, past and present, and describe the quality, good and poor, of those relationships. Target Date: 2024-06-30 Frequency: Monthly  Progress: 55 Modality: individual  Related  Interventions Conduct Interpersonal Therapy beginning with the assessment of the client's interpersonal inventory of important past and present relationships; develop a case formulation linking depression to grief, interpersonal role disputes, role transitions, and/or interpersonal deficits). Objective Learn and implement problem-solving and decision-making skills. Target Date: 2024-06-30 Frequency: Monthly  Progress: 55 Modality: individual  Related Interventions Conduct Problem-Solving Therapy using techniques such as psychoeducation, modeling, and role-playing to teach client problem-solving skills (i.e., defining a problem specifically, generating possible solutions, evaluating the pros and cons of each solution, selecting and implementing a plan of action, evaluating the efficacy of the plan, accepting or revising the plan); role-play application of the problem-solving skill to a real life issue. Encourage in the client the development of a positive problem orientation in which problems and solving them are viewed as a natural part of life and not something to be feared, despaired, or avoided. 3. Develop healthy interpersonal relationships that lead to the alleviation and help prevent the relapse of depression. 4. Develop healthy thinking patterns and beliefs about self, others, and the world that lead to the alleviation and help prevent the relapse of depression. 5. Recognize, accept, and cope with feelings of depression. Diagnosis F33.1  Conditions For Discharge Achievement of treatment goals and objectives   Veva Alma, LCSW

## 2024-06-04 ENCOUNTER — Other Ambulatory Visit: Payer: Self-pay | Admitting: Family

## 2024-06-04 DIAGNOSIS — F909 Attention-deficit hyperactivity disorder, unspecified type: Secondary | ICD-10-CM

## 2024-06-28 ENCOUNTER — Ambulatory Visit: Payer: PRIVATE HEALTH INSURANCE | Admitting: Psychology

## 2024-06-28 DIAGNOSIS — F331 Major depressive disorder, recurrent, moderate: Secondary | ICD-10-CM | POA: Diagnosis not present

## 2024-06-28 NOTE — Progress Notes (Signed)
 [facility] Counselor/Therapist Progress Note  Patient ID: [name], MRN: [id number],    Date: 06/28/2024  Time Spent: 9:00am-9:55am    55 minutes   Treatment Type: Individual Therapy  Reported Symptoms: stress  Mental Status Exam: Appearance:  Casual     Behavior: Appropriate  Motor: Normal  Speech/Language:  Normal Rate  Affect: Appropriate  Mood: normal  Thought process: normal  Thought content:   WNL  Sensory/Perceptual disturbances:   WNL  Orientation: oriented to person, place, time/date, and situation  Attention: Good  Concentration: Good  Memory: WNL  Fund of knowledge:  Good  Insight:   Good  Judgment:  Good  Impulse Control: Good   Risk Assessment: Danger to Self:  No Self-injurious Behavior: No Danger to Others: No Duty to Warn:no Physical Aggression / Violence:No  Access to Firearms a concern: No  Gang Involvement:No   Subjective: Pt present for face-to-face individual therapy via video.  Pt consents to telehealth video session and is aware of limitations and benefits of virtual sessions.   Location of pt: home Location of therapist: home office.   Pt talked about still working on getting adjusted to living in her camper.  Pt has all of her things moved into her camper now but has to get organized and get the heat running. Pt talked about her health.   She still does not feel well and gets tired easily.   She has not been able to be as active as she likes to be and this is frustrating for her.   It is also difficult bc the doctors do not know what is contributing to pt's symptoms.   Pt talked about work. Addressed the work dynamics and how they impact pt.  Pt feels like her coworkers are lazy which really bothers pt.  Worked on optician, dispensing.  Worked on self care strategies. Provided supportive therapy.    Interventions: Cognitive Behavioral Therapy and Insight-Oriented  Diagnosis:  F33.1  Plan of Care: Recommend ongoing  therapy.   Pt participated in setting treatment goals.   Plan to meet monthly.  Pt agrees with treatment plan.   Treatment Plan Client Abilities/Strengths  Pt is bright, engaging, and motivated for therapy.   Client Treatment Preferences  Individual therapy.  Client Statement of Needs  Improve coping skills.  Symptoms  Depressed or irritable mood.  Problems Addressed  Unipolar Depression Goals 1. Alleviate depressive symptoms and return to previous level of effective functioning. 2. Appropriately grieve the loss in order to normalize mood and to return to previously adaptive level of functioning. Objective Learn and implement behavioral strategies to overcome depression. Target Date: 2024-06-30 Frequency: Monthly  Progress: 55 Modality: individual  Related Interventions Engage the client in behavioral activation, increasing his/her activity level and contact with sources of reward, while identifying processes that inhibit activation.  Use behavioral techniques such as instruction, rehearsal, role-playing, role reversal, as needed, to facilitate activity in the client's daily life; reinforce success. Assist the client in developing skills that increase the likelihood of deriving pleasure from behavioral activation (e.g., assertiveness skills, developing an exercise plan, less internal/more external focus, increased social involvement); reinforce success. Objective Identify important people in life, past and present, and describe the quality, good and poor, of those relationships. Target Date: 2024-06-30 Frequency: Monthly  Progress: 55 Modality: individual  Related Interventions Conduct Interpersonal Therapy beginning with the assessment of the client's interpersonal inventory of important past and present relationships; develop a case formulation linking depression to grief,  interpersonal role disputes, role transitions, and/or interpersonal deficits). Objective Learn and implement  problem-solving and decision-making skills. Target Date: 2024-06-30 Frequency: Monthly  Progress: 55 Modality: individual  Related Interventions Conduct Problem-Solving Therapy using techniques such as psychoeducation, modeling, and role-playing to teach client problem-solving skills (i.e., defining a problem specifically, generating possible solutions, evaluating the pros and cons of each solution, selecting and implementing a plan of action, evaluating the efficacy of the plan, accepting or revising the plan); role-play application of the problem-solving skill to a real life issue. Encourage in the client the development of a positive problem orientation in which problems and solving them are viewed as a natural part of life and not something to be feared, despaired, or avoided. 3. Develop healthy interpersonal relationships that lead to the alleviation and help prevent the relapse of depression. 4. Develop healthy thinking patterns and beliefs about self, others, and the world that lead to the alleviation and help prevent the relapse of depression. 5. Recognize, accept, and cope with feelings of depression. Diagnosis F33.1  Conditions For Discharge Achievement of treatment goals and objectives   Veva Alma, LCSW

## 2024-08-16 ENCOUNTER — Other Ambulatory Visit: Payer: Self-pay | Admitting: Family

## 2024-08-16 DIAGNOSIS — F909 Attention-deficit hyperactivity disorder, unspecified type: Secondary | ICD-10-CM

## 2024-08-16 NOTE — Telephone Encounter (Signed)
 Requesting: [place]   Contract: 10/21/23 UDS: 10/21/23 Last Visit: 04/20/24 Next Visit: None Last Refill: 06/05/24 #30 and 0RF   Please Advise

## 2024-09-02 ENCOUNTER — Ambulatory Visit: Payer: PRIVATE HEALTH INSURANCE | Admitting: Psychology

## 2024-09-08 ENCOUNTER — Other Ambulatory Visit (HOSPITAL_COMMUNITY): Payer: Self-pay

## 2024-09-18 ENCOUNTER — Other Ambulatory Visit: Payer: Self-pay | Admitting: Family

## 2024-09-18 DIAGNOSIS — F909 Attention-deficit hyperactivity disorder, unspecified type: Secondary | ICD-10-CM

## 2024-10-04 ENCOUNTER — Ambulatory Visit: Payer: PRIVATE HEALTH INSURANCE | Admitting: Psychology
